# Patient Record
Sex: Male | Born: 1935 | Race: White | Hispanic: No | Marital: Married | Smoking: Former smoker
Health system: Southern US, Community
[De-identification: ages and names within clinical notes are randomized; demographics above are authoritative.]

## PROBLEM LIST (undated history)

## (undated) DIAGNOSIS — Z9989 Dependence on other enabling machines and devices: Secondary | ICD-10-CM

## (undated) DIAGNOSIS — R079 Chest pain, unspecified: Secondary | ICD-10-CM

## (undated) DIAGNOSIS — R55 Syncope and collapse: Secondary | ICD-10-CM

## (undated) DIAGNOSIS — Z9289 Personal history of other medical treatment: Secondary | ICD-10-CM

## (undated) DIAGNOSIS — G4733 Obstructive sleep apnea (adult) (pediatric): Secondary | ICD-10-CM

## (undated) DIAGNOSIS — R0609 Other forms of dyspnea: Secondary | ICD-10-CM

## (undated) DIAGNOSIS — Z950 Presence of cardiac pacemaker: Secondary | ICD-10-CM

## (undated) DIAGNOSIS — K219 Gastro-esophageal reflux disease without esophagitis: Secondary | ICD-10-CM

## (undated) DIAGNOSIS — I2510 Atherosclerotic heart disease of native coronary artery without angina pectoris: Secondary | ICD-10-CM

## (undated) DIAGNOSIS — R42 Dizziness and giddiness: Secondary | ICD-10-CM

## (undated) DIAGNOSIS — I495 Sick sinus syndrome: Secondary | ICD-10-CM

## (undated) DIAGNOSIS — R0600 Dyspnea, unspecified: Secondary | ICD-10-CM

## (undated) HISTORY — DX: Chest pain, unspecified: R07.9

## (undated) HISTORY — DX: Personal history of other medical treatment: Z92.89

## (undated) HISTORY — DX: Atherosclerotic heart disease of native coronary artery without angina pectoris: I25.10

## (undated) HISTORY — DX: Dyspnea, unspecified: R06.00

## (undated) HISTORY — DX: Dizziness and giddiness: R42

## (undated) HISTORY — DX: Other forms of dyspnea: R06.09

---

## 2010-05-20 ENCOUNTER — Encounter: Admission: RE | Admit: 2010-05-20 | Discharge: 2010-05-20 | Payer: Self-pay | Admitting: Gastroenterology

## 2013-02-01 ENCOUNTER — Other Ambulatory Visit: Payer: Self-pay | Admitting: Neurology

## 2013-02-01 ENCOUNTER — Encounter: Payer: Self-pay | Admitting: Neurology

## 2013-02-01 ENCOUNTER — Ambulatory Visit (INDEPENDENT_AMBULATORY_CARE_PROVIDER_SITE_OTHER): Payer: Medicare Other

## 2013-02-01 ENCOUNTER — Ambulatory Visit (INDEPENDENT_AMBULATORY_CARE_PROVIDER_SITE_OTHER): Payer: Medicare Other | Admitting: Neurology

## 2013-02-01 VITALS — BP 130/71 | HR 73 | Ht 72.0 in | Wt 200.0 lb

## 2013-02-01 DIAGNOSIS — R0602 Shortness of breath: Secondary | ICD-10-CM

## 2013-02-01 DIAGNOSIS — R42 Dizziness and giddiness: Secondary | ICD-10-CM

## 2013-02-01 DIAGNOSIS — R55 Syncope and collapse: Secondary | ICD-10-CM

## 2013-02-01 DIAGNOSIS — R5381 Other malaise: Secondary | ICD-10-CM

## 2013-02-01 DIAGNOSIS — H538 Other visual disturbances: Secondary | ICD-10-CM

## 2013-02-01 DIAGNOSIS — G459 Transient cerebral ischemic attack, unspecified: Secondary | ICD-10-CM

## 2013-02-01 DIAGNOSIS — G4733 Obstructive sleep apnea (adult) (pediatric): Secondary | ICD-10-CM

## 2013-02-01 DIAGNOSIS — R531 Weakness: Secondary | ICD-10-CM

## 2013-02-01 NOTE — Procedures (Signed)
  History:  Colton Bennett is a 78 year old gentleman with a history of dizziness dating back 2 years. The patient has had a recent event of syncope and collapse that occurred several weeks prior to this evaluation. The patient being evaluated for this event.  This is a routine EEG. No skull defects are noted. Medications include multivitamins, Prilosec, and Uroxatral.  EEG classification: Normal awake and drowsy  Description of the recording: The background rhythms of this recording consists of a fairly well modulated medium amplitude alpha rhythm of 9 Hz that is reactive to eye opening and closure. As the record progresses, the patient appears to remain in the waking state throughout the recording. Photic stimulation was performed, resulting in a bilateral and symmetric photic driving response. Hyperventilation was also performed, resulting in a minimal buildup of the background rhythm activities without significant slowing seen. Toward the end of the recording, the patient enters the drowsy state with slight symmetric slowing seen. The patient never enters stage II sleep. At no time during the recording does there appear to be evidence of spike or spike wave discharges or evidence of focal slowing. EKG monitor shows no evidence of cardiac rhythm abnormalities with a heart rate of 60. Occasional PVCs were seen.  Impression: This is a normal EEG recording in the waking and drowsy state. No evidence of ictal or interictal discharges are seen.

## 2013-02-01 NOTE — Patient Instructions (Addendum)
I think overall you are doing fairly well but I do want to suggest a few things today:  Remember to drink plenty of fluid, eat healthy meals and do not skip any meals. Try to eat protein with a every meal and eat a healthy snack such as fruit or nuts in between meals. Try to keep a regular sleep-wake schedule and try to exercise daily, particularly in the form of walking, 20-30 minutes a day, if you can.   Make sure, you always change positions very slowly. You should walk with a cane for safety.   As far as your medications are concerned, I would like to suggest no changes, we may have you start a baby aspirin after these tests.   As far as diagnostic testing: blood work, multiple tests.   I would like to see you back in 3 months, sooner if we need to. Please call us with any interim questions, concerns, problems, updates or refill requests.  Please also call us for any test results so we can go over those with you on the phone. Brett Canales is my clinical assistant and will answer any of your questions and relay your messages to me and also relay most of my messages to you.  Our phone number is [phone]. We also have an after hours call service for urgent matters and there is a physician on-call for urgent questions. For any emergencies you know to call 911 or go to the nearest emergency room.
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
## 2013-02-02 ENCOUNTER — Ambulatory Visit (HOSPITAL_COMMUNITY): Payer: Medicare Other | Attending: Neurology | Admitting: Radiology

## 2013-02-02 ENCOUNTER — Other Ambulatory Visit (HOSPITAL_COMMUNITY): Payer: Self-pay | Admitting: Neurology

## 2013-02-02 ENCOUNTER — Encounter (INDEPENDENT_AMBULATORY_CARE_PROVIDER_SITE_OTHER): Payer: Medicare Other

## 2013-02-02 ENCOUNTER — Telehealth: Payer: Self-pay | Admitting: *Deleted

## 2013-02-02 ENCOUNTER — Other Ambulatory Visit: Payer: Self-pay | Admitting: *Deleted

## 2013-02-02 DIAGNOSIS — R55 Syncope and collapse: Secondary | ICD-10-CM

## 2013-02-02 DIAGNOSIS — R42 Dizziness and giddiness: Secondary | ICD-10-CM

## 2013-02-02 DIAGNOSIS — R0989 Other specified symptoms and signs involving the circulatory and respiratory systems: Secondary | ICD-10-CM | POA: Insufficient documentation

## 2013-02-02 DIAGNOSIS — H538 Other visual disturbances: Secondary | ICD-10-CM

## 2013-02-02 DIAGNOSIS — R531 Weakness: Secondary | ICD-10-CM

## 2013-02-02 DIAGNOSIS — R0602 Shortness of breath: Secondary | ICD-10-CM

## 2013-02-02 DIAGNOSIS — G4733 Obstructive sleep apnea (adult) (pediatric): Secondary | ICD-10-CM

## 2013-02-02 DIAGNOSIS — G459 Transient cerebral ischemic attack, unspecified: Secondary | ICD-10-CM

## 2013-02-02 DIAGNOSIS — Z8673 Personal history of transient ischemic attack (TIA), and cerebral infarction without residual deficits: Secondary | ICD-10-CM | POA: Insufficient documentation

## 2013-02-02 DIAGNOSIS — R0609 Other forms of dyspnea: Secondary | ICD-10-CM | POA: Insufficient documentation

## 2013-02-02 DIAGNOSIS — I080 Rheumatic disorders of both mitral and aortic valves: Secondary | ICD-10-CM | POA: Insufficient documentation

## 2013-02-02 DIAGNOSIS — Z87891 Personal history of nicotine dependence: Secondary | ICD-10-CM | POA: Insufficient documentation

## 2013-02-02 DIAGNOSIS — R5381 Other malaise: Secondary | ICD-10-CM | POA: Insufficient documentation

## 2013-02-02 LAB — CBC WITH DIFFERENTIAL
Eosinophils Absolute: 0.1 10*3/uL (ref 0.0–0.4)
Immature Grans (Abs): 0 10*3/uL (ref 0.0–0.1)
Immature Granulocytes: 0 % (ref 0–2)
Lymphocytes Absolute: 1.7 10*3/uL (ref 0.7–3.1)
MCH: 30.5 pg (ref 26.6–33.0)
MCHC: 33.8 g/dL (ref 31.5–35.7)
MCV: 90 fL (ref 79–97)
Monocytes Absolute: 0.6 10*3/uL (ref 0.1–0.9)
Neutrophils Relative %: 63 % (ref 40–74)
Platelets: 200 10*3/uL (ref 150–379)
RDW: 13.7 % (ref 12.3–15.4)

## 2013-02-02 LAB — COMPREHENSIVE METABOLIC PANEL
ALT: 13 IU/L (ref 0–44)
AST: 18 IU/L (ref 0–40)
Albumin/Globulin Ratio: 1.9 (ref 1.1–2.5)
Alkaline Phosphatase: 81 IU/L (ref 39–117)
BUN/Creatinine Ratio: 15 (ref 10–22)
CO2: 24 mmol/L (ref 18–29)
Calcium: 9.8 mg/dL (ref 8.6–10.2)
Creatinine, Ser: 1.06 mg/dL (ref 0.76–1.27)
GFR calc non Af Amer: 67 mL/min/{1.73_m2} (ref >59)
Globulin, Total: 2.2 g/dL (ref 1.5–4.5)
Potassium: 4.3 mmol/L (ref 3.5–5.2)
Sodium: 144 mmol/L (ref 134–144)

## 2013-02-02 LAB — SEDIMENTATION RATE: Sed Rate: 2 mm/hr (ref 0–30)

## 2013-02-02 LAB — RHEUMATOID FACTOR: Rhuematoid fact SerPl-aCnc: <7 IU/mL (ref 0.0–13.9)

## 2013-02-02 LAB — TSH: TSH: 3.44 u[IU]/mL (ref 0.450–4.500)

## 2013-02-02 NOTE — Telephone Encounter (Signed)
OBrien called from [facility] about pt and the test ordered for tomorrow  2Decho with bubble.  Confirming test to be done?   I consulted Dr. Frances Furbish and she wants to have just 2D Echo , no bubble.  LMVM this am for Obrien [phone].  Tried to cancel but could not due to pt scheduled for test.  Called and spoke to [place] at [place] to try to reach [place].

## 2013-02-02 NOTE — Progress Notes (Signed)
Echocardiogram performed.  

## 2013-02-02 NOTE — Telephone Encounter (Signed)
30 day event monitor placed on Pt 02/02/13 TK

## 2013-02-02 NOTE — Progress Notes (Signed)
Quick Note:  Please call patient and advise him that his brain wave test, EEG, was reported as normal in the awake and drowsy state. Also his echocardiogram result is back which is the ultrasound of the heart showing no significant abnormalities in the heart but the left atrium is mild to moderately dilated. I do not see that this is a significant problem but if he has never seen a cardiologist I would like for him to get checked out fully. Again there is no problem with his heart valves or heart wall motion or the pump function, but for completion of would like for him to see a cardiologist unless he was seen before. Please advise  Huston Foley, MD, PhD [facility] ([facility]  ______

## 2013-02-03 ENCOUNTER — Telehealth: Payer: Self-pay | Admitting: Neurology

## 2013-02-03 NOTE — Telephone Encounter (Signed)
Pt called would like Dr. Frances Furbish to return his call concerning his results. Thanks

## 2013-02-03 NOTE — Progress Notes (Signed)
Quick Note:  Please call and advise the patient that the recent labs we checked were within normal limits. We checked cell count, blood sugar, diabetes marker, electrolytes, kidney function, liver function, thyroid function, inflammatory markers, sedimentation rate, muscle enzymes. No further action is required on these tests at this time. Please remind patient to keep any upcoming appointments or tests and to call us with any interim questions, concerns, problems or updates. Thanks,  Huston Foley, MD, PhD    ______

## 2013-02-06 NOTE — Telephone Encounter (Signed)
Pt called would like Dr. Athar to return his call concerning his results. Thanks  °

## 2013-02-06 NOTE — Progress Notes (Signed)
Quick Note:  Spoke with patient's wife and relayed results of blood work. Mrs Rabideau understood and had no questions.  ______

## 2013-02-06 NOTE — Progress Notes (Addendum)
Quick Note:  Spoke with patient's wife and relayed the results of his EEG as well as Dr Teofilo Pod advise or recommendations. Mrs Scheffel would like Dr Frances Furbish to refer Mr Opheim to a cardiologist.   ______ Referral to cardiology entered. Pls help initiate. thx s

## 2013-02-07 NOTE — Addendum Note (Signed)
Addended by: Huston Foley on: 02/07/2013 03:39 PM   Modules accepted: Orders

## 2013-02-08 ENCOUNTER — Ambulatory Visit (INDEPENDENT_AMBULATORY_CARE_PROVIDER_SITE_OTHER): Payer: Medicare Other | Admitting: Neurology

## 2013-02-08 DIAGNOSIS — G4733 Obstructive sleep apnea (adult) (pediatric): Secondary | ICD-10-CM

## 2013-02-08 DIAGNOSIS — R9431 Abnormal electrocardiogram [ECG] [EKG]: Secondary | ICD-10-CM

## 2013-02-08 DIAGNOSIS — G479 Sleep disorder, unspecified: Secondary | ICD-10-CM

## 2013-02-08 NOTE — Telephone Encounter (Signed)
I called and talked to his wife yesterday in the afternoon on the phone. We are going to proceed with cardiology consultation. He is also undergoing a sleep study and is scheduled for his brain scans. His recent echocardiogram results and EEG are discussed again with her. She had no further questions and was in agreement.

## 2013-02-09 ENCOUNTER — Ambulatory Visit
Admission: RE | Admit: 2013-02-09 | Discharge: 2013-02-09 | Disposition: A | Discharge status: Home / Self Care | Payer: Medicare Other | Source: Ambulatory Visit | Attending: Neurology | Admitting: Neurology

## 2013-02-09 DIAGNOSIS — R55 Syncope and collapse: Secondary | ICD-10-CM

## 2013-02-09 DIAGNOSIS — G459 Transient cerebral ischemic attack, unspecified: Secondary | ICD-10-CM

## 2013-02-09 DIAGNOSIS — H538 Other visual disturbances: Secondary | ICD-10-CM

## 2013-02-09 DIAGNOSIS — R531 Weakness: Secondary | ICD-10-CM

## 2013-02-09 DIAGNOSIS — R42 Dizziness and giddiness: Secondary | ICD-10-CM

## 2013-02-09 DIAGNOSIS — R0602 Shortness of breath: Secondary | ICD-10-CM

## 2013-02-09 MED ORDER — GADOBENATE DIMEGLUMINE 529 MG/ML IV SOLN: 20.0000 mL | Freq: Once | INTRAVENOUS | Status: AC | PRN

## 2013-02-21 ENCOUNTER — Telehealth: Payer: Self-pay | Admitting: Neurology

## 2013-02-23 NOTE — Telephone Encounter (Signed)
Returned patients call. No answer.

## 2013-02-24 ENCOUNTER — Telehealth: Payer: Self-pay | Admitting: Neurology

## 2013-02-24 DIAGNOSIS — G4733 Obstructive sleep apnea (adult) (pediatric): Secondary | ICD-10-CM

## 2013-02-24 NOTE — Telephone Encounter (Signed)
Please call and notify the patient that the recent sleep study did confirm the diagnosis of obstructive sleep apnea and that I recommend treatment for this in the form of CPAP. This will require a repeat sleep study for proper titration and mask fitting. Please explain to patient and arrange for a CPAP titration study. I have placed an order in the chart. The reason why he did not end up using CPAP during the study was mainly because his sleep is quite interrupted. And it was primarily in dream sleep that he exhibits more severe sleep apnea and this happened later at night. I noticed some EKG changes which were mostly in the form of extra heartbeats. I know that he still has his cardiac monitor. I was wondering when he is scheduled to see cardiology or whether he has not heard back yet. We may have to follow through with his cardiology referral. Thanks, Huston Foley, MD, PhD [facility]

## 2013-03-06 NOTE — Telephone Encounter (Signed)
Called patient and relayed  What Dr. Frances Furbish recommended, I also made [facility] aware of the patient needing a sleep study.Patient has an appointment with his cardiologist  04-02-2013

## 2013-03-07 ENCOUNTER — Encounter: Payer: Self-pay | Admitting: *Deleted

## 2013-03-07 NOTE — Telephone Encounter (Signed)
Spoke to patient and spouse together on the line, apologized for the delay in results and explained that I had been out of the office.  Called patient to discuss sleep study results.  Discussed findings, recommendations and follow up care.  Patient understood well and all questions were answered.  Scheduled CPAP Titration for 03/15/2013 at 9:30 PM, patient would like [place] to have room for his spouse to stay if possible, they are elderly and he doesn't feel comfortable having her home alone.  We will accomodate that on our schedule that night.  Copy of results will be forwarded to Dr. Andi Devon of [facility]. A copy of the sleep study interpretive report  and the date of the CPAP TITRATION appointment info will be mailed to the patient's home.

## 2013-03-13 HISTORY — PX: LOOP RECORDER IMPLANT: SHX5954

## 2013-03-14 NOTE — Progress Notes (Signed)
Quick Note:  Please advise patient that his MRA brain and neck showed no significant blood vessel related issues in his brain or neck. His brain MRI showed a normal structure of the brain and no significant volume loss which we call atrophy. There were changes in the deeper structures of the brain, which we call white matter changes or microvascular changes. These are tiny white spots, that occur with time and are seen in a variety of conditions, including with normal aging, chronic hypertension, chronic headaches, especially migraine HAs, chronic diabetes, chronic hyperlipidemia and other considerations include: considerations include autoimmune, inflammatory, post-infectious etiologies. Again, there were no acute findings, such as a stroke, or mass or blood products. We like to re-enforce the importance of good blood pressure control, good cholesterol control, good blood sugar control, and weight management.  Since we did the brain scan without contrast we could if he agrees follow this up with a brain MRI with contrast. That will give [place] reassurance that the abnormalities seen on the brain MRI without contrast are not something that is inflammatory in nature. Please ask him if he is willing to go ahead with a contrasted brain MRI for which I would need to place a separate order. Please let me know.   Please remind patient to keep any upcoming appointments or tests and to call us with any interim questions, concerns, problems or updates. Thanks,  Huston Foley, MD, PhD    ______

## 2013-03-17 ENCOUNTER — Telehealth: Payer: Self-pay | Admitting: *Deleted

## 2013-03-17 DIAGNOSIS — R9089 Other abnormal findings on diagnostic imaging of central nervous system: Secondary | ICD-10-CM

## 2013-03-17 NOTE — Telephone Encounter (Signed)
Please call Patients spouse with MRI and MRA Results from July.

## 2013-03-17 NOTE — Telephone Encounter (Signed)
Spoke to spouse and patient. Gave Dr. Teofilo Pod MRI/MRA results per her note on 03/14/13. Patient would like to proceed with MRI w/ contrast.

## 2013-03-21 ENCOUNTER — Ambulatory Visit (INDEPENDENT_AMBULATORY_CARE_PROVIDER_SITE_OTHER): Payer: Medicare Other | Admitting: Neurology

## 2013-03-21 VITALS — BP 119/67 | HR 54 | Ht 72.0 in | Wt 196.0 lb

## 2013-03-21 DIAGNOSIS — R9431 Abnormal electrocardiogram [ECG] [EKG]: Secondary | ICD-10-CM

## 2013-03-21 DIAGNOSIS — G4733 Obstructive sleep apnea (adult) (pediatric): Secondary | ICD-10-CM

## 2013-03-21 DIAGNOSIS — G479 Sleep disorder, unspecified: Secondary | ICD-10-CM

## 2013-03-27 NOTE — Telephone Encounter (Signed)
MRI brain w contrast ordered.  Huston Foley, MD, PhD [facility])

## 2013-03-30 ENCOUNTER — Ambulatory Visit
Admission: RE | Admit: 2013-03-30 | Discharge: 2013-03-30 | Disposition: A | Discharge status: Home / Self Care | Payer: Medicare Other | Source: Ambulatory Visit | Attending: Neurology | Admitting: Neurology

## 2013-03-30 ENCOUNTER — Telehealth: Payer: Self-pay | Admitting: Neurology

## 2013-03-30 DIAGNOSIS — R9409 Abnormal results of other function studies of central nervous system: Secondary | ICD-10-CM

## 2013-03-30 DIAGNOSIS — R9089 Other abnormal findings on diagnostic imaging of central nervous system: Secondary | ICD-10-CM

## 2013-03-30 DIAGNOSIS — G4733 Obstructive sleep apnea (adult) (pediatric): Secondary | ICD-10-CM

## 2013-03-30 MED ORDER — GADOBENATE DIMEGLUMINE 529 MG/ML IV SOLN
18.0000 mL | Freq: Once | INTRAVENOUS | Status: AC | PRN
  Administered 2013-03-30: 18 mL via INTRAVENOUS

## 2013-03-30 NOTE — Telephone Encounter (Signed)
Please call and notify patient that the recent sleep study good results with CPAP. Therefore, I would like start the patient on CPAP at home. I placed the order in the chart.   Arrange for CPAP set up at home through a DME company of patient's choice and fax/route report to PCP and referring MD (if other than PCP).   The patient will also need a follow up appointment with me in 6-8 weeks post set up that has to be scheduled; help the patient schedule this (in a follow-up slot).   Please re-enforce the importance of compliance with treatment and the need for [place] to monitor compliance data.   Once you have spoken to the patient and scheduled the return appointment, you may close this encounter, thanks,   Huston Foley, MD, PhD [facility]

## 2013-04-03 ENCOUNTER — Ambulatory Visit (INDEPENDENT_AMBULATORY_CARE_PROVIDER_SITE_OTHER): Payer: Medicare Other | Admitting: Cardiovascular Disease

## 2013-04-03 ENCOUNTER — Telehealth: Payer: Self-pay | Admitting: Neurology

## 2013-04-03 ENCOUNTER — Encounter: Payer: Self-pay | Admitting: *Deleted

## 2013-04-03 ENCOUNTER — Encounter: Payer: Self-pay | Admitting: Cardiovascular Disease

## 2013-04-03 VITALS — BP 128/78 | HR 75 | Ht 72.0 in | Wt 201.0 lb

## 2013-04-03 DIAGNOSIS — R55 Syncope and collapse: Secondary | ICD-10-CM

## 2013-04-03 DIAGNOSIS — R0609 Other forms of dyspnea: Secondary | ICD-10-CM | POA: Insufficient documentation
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
## 2013-04-03 NOTE — Telephone Encounter (Signed)
Call pt, no answer, left message.

## 2013-04-03 NOTE — Progress Notes (Signed)
Quick Note:  Please advise the patient that his recent repeated MRI brain with contrast did not show any abnormal uptake of contrast and no concern for any lesions or mass or tumor or findings of an inflammation or infection. The findings are reassuring. Huston Foley, MD, PhD [facility]  ______
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
## 2013-04-03 NOTE — Patient Instructions (Addendum)
Dr Elease Hashimoto requests that you be scheduled for a loop recorder, this device will be inserted under the skin and can stay in place for a year . See instruction  Your physician wants you to follow-up in: 6 months receive a reminder letter in the mail two months in advance. If you don't receive a letter, please call our office to schedule the follow-up appointment.

## 2013-04-04 ENCOUNTER — Encounter (HOSPITAL_COMMUNITY): Payer: Self-pay | Admitting: Pharmacy Technician

## 2013-04-05 ENCOUNTER — Encounter: Payer: Self-pay | Admitting: *Deleted

## 2013-04-05 ENCOUNTER — Encounter (HOSPITAL_COMMUNITY): Payer: Self-pay | Admitting: Internal Medicine

## 2013-04-05 ENCOUNTER — Ambulatory Visit (HOSPITAL_COMMUNITY)
Admission: RE | Admit: 2013-04-05 | Discharge: 2013-04-05 | Disposition: A | Discharge status: Home / Self Care | Payer: Medicare Other | Source: Ambulatory Visit | Attending: Internal Medicine | Admitting: Internal Medicine

## 2013-04-05 ENCOUNTER — Encounter (HOSPITAL_COMMUNITY)
Admission: RE | Disposition: A | Discharge status: Home / Self Care | Payer: Self-pay | Source: Ambulatory Visit | Attending: Internal Medicine

## 2013-04-05 DIAGNOSIS — R55 Syncope and collapse: Secondary | ICD-10-CM

## 2013-04-05 DIAGNOSIS — R42 Dizziness and giddiness: Secondary | ICD-10-CM

## 2013-04-05 HISTORY — PX: LOOP RECORDER IMPLANT: SHX5477

## 2013-04-05 HISTORY — DX: Syncope and collapse: R55

## 2013-04-05 SURGERY — LOOP RECORDER IMPLANT
Anesthesia: LOCAL

## 2013-04-05 MED ORDER — LIDOCAINE-EPINEPHRINE 1 %-1:100000 IJ SOLN
INTRAMUSCULAR | Status: AC
  Filled 2013-04-05: qty 1

## 2013-04-05 NOTE — Interval H&P Note (Signed)
History and Physical Interval Note:  04/05/2013 8:06 AM  Colton Bennett  has presented today for surgery, with the diagnosis of syncope  The various methods of treatment have been discussed with the patient and family. After consideration of risks, benefits and other options for treatment, the patient has consented to  Procedure(s): LOOP RECORDER IMPLANT (N/A) as a surgical intervention .  The patient's history has been reviewed, patient examined, no change in status, stable for surgery.  I have reviewed the patient's chart and labs.  Questions were answered to the patient's satisfaction.     Hillis Range

## 2013-04-05 NOTE — Op Note (Signed)
SURGEON:  Hillis Range, MD     PREPROCEDURE DIAGNOSIS:  unexplained near syncope, dizziness    POSTPROCEDURE DIAGNOSIS:   unexplained near syncope, dizziness     PROCEDURES:   1. Implantable loop recorder implantation    INTRODUCTION:  Colton Bennett is a 79 y.o. male with a history of unexplained near syncope and recurrent dizziness who presents today for implantable loop implantation.   Despite an extensive workup by cardiology and neurology, no reversible causes have been identified.  he has worn 30 day event monitor during which he did not have symptoms.  The patient therefore presents today for implantable loop implantation.     DESCRIPTION OF PROCEDURE:  Informed written consent was obtained, and the patient was brought to the electrophysiology lab in a fasting state.  The patient required no sedation for the procedure today.  Mapping over the patient's chest was performed by the EP lab staff to identify the area where electrograms were most prominent for ILR recording.  This area was found to be the left parasternal region over the 3rd-4th intercostal space. The patients left chest was therefore prepped and draped in the usual sterile fashion by the EP lab staff. The skin overlying the left parasternal region was infiltrated with lidocaine for local analgesia.  A 0.5-cm incision was made over the left parasternal region over the 3rd intercostal space.  A subcutaneous ILR pocket was fashioned using a combination of sharp and blunt dissection.  A Medtronic Reveal [place] model [id number] implantable loop recorder was then placed into the pocket  R waves were very prominent and measured 0.27mV.  Steri- Strips and a sterile dressing were then applied.  There were no early apparent complications.     CONCLUSIONS:   1. Successful implantation of a Medtronic Reveal LINQ implantable loop recorder for recurrent dizziness and near syncope  2. No early apparent complications.

## 2013-04-05 NOTE — Telephone Encounter (Signed)
Patient also asked about his MRI results saying he hadn't heard anything.  I was able to see Dr. Teofilo Pod quick note and read those results to him stating normal findings.

## 2013-04-05 NOTE — Telephone Encounter (Signed)
Called patient to discuss sleep study results.  Discussed findings, recommendations and follow up care.  Patient understood well and all questions were answered.   Orders will be forwarded to [facility] as this patient has Medicare.  Asked the patient to call me in one week if he hasn't heard from DME company for setup.  Patient already has a follow up appt scheduled in November with Dr. Frances Furbish. A copy of the sleep study interpretive report as well as a letter with info regarding contact info for the DME company, the importance of CPAP compliance, and the date of the follow up appointment info will be mailed to the patient's home. Copy of report will be faxed to Dr. Andi Devon

## 2013-04-05 NOTE — Consult Note (Signed)
 Primary Care Physician: ATHAR, SAIMA, MD Referring Physician:  Dr Nahser   Colton Bennett is a 79 y.o. male with a h/o presents for EP consultation following an episode of presyncope.  He reports that on 01/14/13 he had sudden collapse.  He reporrts that he was talking beside his truck in a parking lot.  He developed abrupt nausea with vomiting and was then unsteady.  He collapsed but did not lose consciousness. He wore a 30 day event monitor. He was found to have normal sinus rhythm but did not have further symptoms while wearing this monitor.  He had an echocardiogram which revealed normal left ventricle systolic function with an ejection fraction of 50-55%. He had normal wall motion. He had trivial aortic insufficiency, mild mitral regurgitation. The left atrium was mildly to moderately dilated.  He has had 2 different sleep studies. The first study was July 31 which revealed severe sleep apnea during REM sleep. He had a second sleep study the results of which are not known at this time.  He has occasional periods of brief dizziness lasting several seconds.  Today, he denies symptoms of palpitations, chest pain, shortness of breath, orthopnea, PND, lower extremity edema, dizziness, further presyncope, syncope, or neurologic sequela. The patient is tolerating medications without difficulties and is otherwise without complaint today.   Past Medical History  Diagnosis Date  . Syncope, near    No prior surgeries  Current Facility-Administered Medications  Medication Dose Route Frequency Provider Last Rate Last Dose  . [COMPLETED] lidocaine-EPINEPHrine (XYLOCAINE W/EPI) 1 %-1:100000 (with pres) injection             No Known Allergies  History   Social History  . Marital Status: Married    Spouse Name: N/A    Number of Children: N/A  . Years of Education: N/A   Occupational History  . Not on file.   Social History Main Topics  . Smoking status: Former Smoker    Quit date: 02/01/1982    . Smokeless tobacco: Not on file  . Alcohol Use: No  . Drug Use: No  . Sexual Activity: Not on file   Other Topics Concern  . Not on file   Social History Narrative  . No narrative on file    Denies FH of syncope or sudden death  ROS- All systems are reviewed and negative except as per the HPI above  Physical Exam: Filed Vitals:   04/05/13 0713  BP: 144/69  Pulse: 73  Temp: 97.8 F (36.6 C)  TempSrc: Oral  Resp: 18  Height: 6' (1.829 m)  Weight: 195 lb (88.451 kg)  SpO2: 99%    GEN- The patient is well appearing, alert and oriented x 3 today.   Head- normocephalic, atraumatic Eyes-  Sclera clear, conjunctiva pink Ears- hearing intact Oropharynx- clear Neck- supple, no JVP Lymph- no cervical lymphadenopathy Lungs- Clear to ausculation bilaterally, normal work of breathing Heart- Regular rate and rhythm, no murmurs, rubs or gallops, PMI not laterally displaced GI- soft, NT, ND, + BS Extremities- no clubbing, cyanosis, or edema MS- no significant deformity or atrophy Skin- no rash or lesion Psych- euthymic mood, full affect Neuro- strength and sensation are intact  EKG 04/03/13- sinus rhythm with PVCs not closely coupled to the preceding t wave Echo and event monitor are reviewed  Assessment and Plan:  1. Presyncope of unclear etiology with frequent episodes of dizziness The patient presents today for EP consult.  He has a structurally normal heart, low   risk ekg, and no fh of sudden death.  He does find his presyncope and recurrent dizziness to be alarming.  Unfortunately, he did not have these symptoms recently while wearing his event monitor. I think that the most prudent action would be to place an implantable recordable loop monitor. Risks, benefits, and alteratives to this procedure were discussed at length with the patient who wishes to proceed at this time. 

## 2013-04-05 NOTE — H&P (View-Only) (Signed)
Primary Care Physician: Huston Foley, MD Referring Physician:  Dr Vianne Bulls is a 79 y.o. male with a h/o presents for EP consultation following an episode of presyncope.  He reports that on 01/14/13 he had sudden collapse.  He reporrts that he was talking beside his truck in a parking lot.  He developed abrupt nausea with vomiting and was then unsteady.  He collapsed but did not lose consciousness. He wore a 30 day event monitor. He was found to have normal sinus rhythm but did not have further symptoms while wearing this monitor.  He had an echocardiogram which revealed normal left ventricle systolic function with an ejection fraction of 50-55%. He had normal wall motion. He had trivial aortic insufficiency, mild mitral regurgitation. The left atrium was mildly to moderately dilated.  He has had 2 different sleep studies. The first study was July 31 which revealed severe sleep apnea during REM sleep. He had a second sleep study the results of which are not known at this time.  He has occasional periods of brief dizziness lasting several seconds.  Today, he denies symptoms of palpitations, chest pain, shortness of breath, orthopnea, PND, lower extremity edema, dizziness, further presyncope, syncope, or neurologic sequela. The patient is tolerating medications without difficulties and is otherwise without complaint today.   Past Medical History  Diagnosis Date  . Syncope, near    No prior surgeries  Current Facility-Administered Medications  Medication Dose Route Frequency Provider Last Rate Last Dose  . [COMPLETED] lidocaine-EPINEPHrine (XYLOCAINE W/EPI) 1 %-1:100000 (with pres) injection             No Known Allergies  History   Social History  . Marital Status: Married    Spouse Name: N/A    Number of Children: N/A  . Years of Education: N/A   Occupational History  . Not on file.   Social History Main Topics  . Smoking status: Former Smoker    Quit date: 02/01/1982    . Smokeless tobacco: Not on file  . Alcohol Use: No  . Drug Use: No  . Sexual Activity: Not on file   Other Topics Concern  . Not on file   Social History Narrative  . No narrative on file    Denies FH of syncope or sudden death  ROS- All systems are reviewed and negative except as per the HPI above  Physical Exam: Filed Vitals:   04/05/13 0713  BP: 144/69  Pulse: 73  Temp: 97.8 F (36.6 C)  TempSrc: Oral  Resp: 18  Height: 6' (1.829 m)  Weight: 195 lb (88.451 kg)  SpO2: 99%    GEN- The patient is well appearing, alert and oriented x 3 today.   Head- normocephalic, atraumatic Eyes-  Sclera clear, conjunctiva pink Ears- hearing intact Oropharynx- clear Neck- supple, no JVP Lymph- no cervical lymphadenopathy Lungs- Clear to ausculation bilaterally, normal work of breathing Heart- Regular rate and rhythm, no murmurs, rubs or gallops, PMI not laterally displaced GI- soft, NT, ND, + BS Extremities- no clubbing, cyanosis, or edema MS- no significant deformity or atrophy Skin- no rash or lesion Psych- euthymic mood, full affect Neuro- strength and sensation are intact  EKG 04/03/13- sinus rhythm with PVCs not closely coupled to the preceding t wave Echo and event monitor are reviewed  Assessment and Plan:  1. Presyncope of unclear etiology with frequent episodes of dizziness The patient presents today for EP consult.  He has a structurally normal heart, low  risk ekg, and no fh of sudden death.  He does find his presyncope and recurrent dizziness to be alarming.  Unfortunately, he did not have these symptoms recently while wearing his event monitor. I think that the most prudent action would be to place an implantable recordable loop monitor. Risks, benefits, and alteratives to this procedure were discussed at length with the patient who wishes to proceed at this time.

## 2013-04-17 ENCOUNTER — Ambulatory Visit (INDEPENDENT_AMBULATORY_CARE_PROVIDER_SITE_OTHER): Payer: Medicare Other | Admitting: *Deleted

## 2013-04-17 DIAGNOSIS — R55 Syncope and collapse: Secondary | ICD-10-CM

## 2013-04-17 LAB — PACEMAKER DEVICE OBSERVATION

## 2013-04-17 NOTE — Progress Notes (Signed)
ILR wound check in clinic. Normal function. No episodes recorded. ROV in 3 mths w/JA.

## 2013-05-04 ENCOUNTER — Encounter: Payer: Self-pay | Admitting: Internal Medicine

## 2013-05-12 ENCOUNTER — Ambulatory Visit (INDEPENDENT_AMBULATORY_CARE_PROVIDER_SITE_OTHER): Payer: Medicare Other

## 2013-05-12 DIAGNOSIS — R55 Syncope and collapse: Secondary | ICD-10-CM

## 2013-05-16 LAB — PACEMAKER DEVICE OBSERVATION

## 2013-05-18 ENCOUNTER — Encounter: Payer: Self-pay | Admitting: Neurology

## 2013-05-18 ENCOUNTER — Other Ambulatory Visit: Payer: Self-pay

## 2013-05-18 ENCOUNTER — Ambulatory Visit (INDEPENDENT_AMBULATORY_CARE_PROVIDER_SITE_OTHER): Payer: Medicare Other | Admitting: Neurology

## 2013-05-18 VITALS — BP 123/74 | HR 71 | Ht 72.0 in | Wt 207.0 lb

## 2013-05-18 DIAGNOSIS — R42 Dizziness and giddiness: Secondary | ICD-10-CM

## 2013-05-18 DIAGNOSIS — R930 Abnormal findings on diagnostic imaging of skull and head, not elsewhere classified: Secondary | ICD-10-CM

## 2013-05-18 DIAGNOSIS — R55 Syncope and collapse: Secondary | ICD-10-CM

## 2013-05-18 DIAGNOSIS — R9089 Other abnormal findings on diagnostic imaging of central nervous system: Secondary | ICD-10-CM

## 2013-05-18 DIAGNOSIS — G4733 Obstructive sleep apnea (adult) (pediatric): Secondary | ICD-10-CM

## 2013-05-18 NOTE — Progress Notes (Signed)
Subjective:    Patient ID: Colton Bennett is a 79 y.o. male.  HPI  Interim history:  Colton Bennett is a very pleasant 79 year old right-handed woman with an underlying medical history of reflux disease and benign prostate hypertrophy who presents for followup consultation of her syncope and collapse. He is accompanied by his wife again today. I first met him on 02/01/2013, at which time I suggested further workup in the form of blood work including ANA, CRP, CBC, CK, hemoglobin A1c, CMP, serum electrophoresis, RF, RPR, ESR, TSH and I ordered a echocardiogram, EEG, cardiac event monitor, and MRA neck and head and MRI of brain as well as a sleep study. His labs were essentially unremarkable, with the exception of his hemoglobin A1c at 5.8. His TSH was normal. His MRI brain showed white matter changes, and repeat MRI brain with contrast showed no abnormal contrast enhancement. His MRA neck and head were unremarkable. His EEG was unremarkable. He had a baseline sleep study on 02/08/2013 which demonstrated a total AHI of 12 per hour, a severe REM related OSA with a REM-related AHI of 48 per hour and an oxyhemoglobin desaturation nadir of 83%. Based on those results I asked him to return for CPAP titration study which he had on 03/21/2013, his CPAP was titrated from 5-9 cm with almost complete resolution of his sleep disordered breathing on a pressure of 8 cm. He was provided with CPAP at a pressure of 8 cm with large nasal pillows. I also reviewed his compliance data from the machine that he brought in today. This is from 04/17/2013 through 05/17/2013 which is a total of 31 days during which time he used CPAP every day. His percent used days greater than 4 hours was 100% indicating excellent compliance. His average usage was 7 hours and 24 minutes. His leak was not very high. His residual AHI was 2.2 per hour indicating an appropriate treatment pressure of 8 cm of water with EPR of 2. He had a 30 day Holter monitor  which showed no sustained irregularities but some PVCs. Since then, in late September he had a implantable loop recorder placed.  Has been experiencing periods of dizziness and blurry vision as well as nausea and vomiting for the past several years, probably at least 2 years. Recent blood work included TSH, CMP and CBC which I reviewed. His blood work was unremarkable. His TSH was borderline at 4.5. Some 3 weeks ago, on 01/12/13 he had parked his car in a parking lot and was on his way to the store and collapsed. He had no warning signs, no palpitations, and claims he had no LOC, no HA, SOB, but does have SOBOE, no vertigo and since this incidence he feels weak all over. Per wife, some 2 years ago, he had an episode of L facial weakness, with residual weakness. No hx of staring spells, no twitching, no shaking, no automatism, except occasional lip smacking while driving or while eating.  He has had multiple short episodes of blurry vision. He has not seen a cardiologist, but had an eye exam recently. No hx of migraine, but has had mild recurrent HAs. He is doing well with CPAP regarding his sleep, sleeping better, resting better and less tired during the day. His SOB is better as well. He has not had any events since the loop recorder has been placed. It does not bother him. He still has recurrent short lived and milder headaches which last normally around 15 minutes but happen  almost every day. This is a most typically occipitally located headache. He does not have any presyncopal symptoms. He has intermittent blurry vision which is also very brief. All in all, he feels better.  His Past Medical History Is Significant For: Past Medical History  Diagnosis Date  . Syncope, near     His Past Surgical History Is Significant For: History reviewed. No pertinent past surgical history.  His Family History Is Significant For: History reviewed. No pertinent family history.  His Social History Is Significant  For: History   Social History  . Marital Status: Married    Spouse Name: N/A    Number of Children: N/A  . Years of Education: N/A   Social History Main Topics  . Smoking status: Former Smoker    Quit date: 02/01/1982  . Smokeless tobacco: None  . Alcohol Use: No  . Drug Use: No  . Sexual Activity: None   Other Topics Concern  . None   Social History Narrative  . None    His Allergies Are:  No Known Allergies:   His Current Medications Are:  Outpatient Encounter Prescriptions as of 05/18/2013  Medication Sig  . alfuzosin (UROXATRAL) 10 MG 24 hr tablet Take 10 mg by mouth daily.  . Multiple Vitamins-Minerals (MULTIVITAMIN PO) Take 1 tablet by mouth daily.  Marland Kitchen omeprazole (PRILOSEC) 40 MG capsule Take 40 mg by mouth daily.  :  Review of Systems:  Out of a complete 14 point review of systems, all are reviewed and negative with the exception of these symptoms as listed below:   Review of Systems  Constitutional: Negative.   HENT: Negative.   Eyes: Positive for visual disturbance (blurred vision occurs ocassionally - lasts 30 seconds.).  Respiratory: Negative.   Cardiovascular: Negative.   Gastrointestinal: Negative.   Endocrine: Negative.   Genitourinary: Negative.   Musculoskeletal: Negative.   Skin: Negative.   Allergic/Immunologic: Negative.   Neurological: Positive for dizziness (With blurred vision.   ).  Hematological: Negative.   Psychiatric/Behavioral: Negative.     Objective:  Neurologic Exam  Physical Exam Physical Examination:   Filed Vitals:   05/18/13 1531  BP: 123/74  Pulse: 71    General Examination: The patient is a very pleasant 79 y.o. male in no acute distress. He appears well-developed and well-nourished and well groomed and is in good spirits.   HEENT: Normocephalic, atraumatic, pupils are equal, round and reactive to light and accommodation. Extraocular tracking is good without limitation to gaze excursion or nystagmus noted. Normal  smooth pursuit is noted. Hearing is grossly intact. Face is slight asymmetric, unchanged. Speech is clear with no dysarthria noted. There is no hypophonia. There is no lip, neck/head, jaw or voice tremor. Neck is supple with full range of passive and active motion. There are no carotid bruits on auscultation. Oropharynx exam reveals: mild mouth dryness, adequate dental hygiene and moderate airway crowding, due to elongated uvula and redundant soft palate. Mallampati is class II. Tongue protrudes centrally and palate elevates symmetrically.   Chest: Clear to auscultation without wheezing, rhonchi or crackles noted.  Heart: S1+S2+0, regular and normal without murmurs, rubs or gallops noted.   Abdomen: Soft, non-tender and non-distended with normal bowel sounds appreciated on auscultation.  Extremities: There is no pitting edema in the distal lower extremities bilaterally. Pedal pulses are intact.  Skin: Warm and dry without trophic changes noted. There are no varicose veins. He has vitiligo.  Musculoskeletal: exam reveals no obvious joint deformities, tenderness or joint  swelling or erythema.   Neurologically:  Mental status: The patient is awake, alert and oriented in all 4 spheres. His memory, attention, language and knowledge are appropriate. There is no aphasia, agnosia, apraxia or anomia. Speech is clear with normal prosody and enunciation. Thought process is linear. Mood is congruent and affect is normal.  Cranial nerves are as described above under HEENT exam. In addition, shoulder shrug is normal with equal shoulder height noted. Motor exam: Normal bulk, strength and tone is noted. There is no drift, tremor or rebound. Romberg is negative, but there was mild sway. Reflexes are 2+ throughout. Fine motor skills are intact. Cerebellar testing shows no dysmetria or intention tremor on finger to nose testing. There is no truncal or gait ataxia. Sensory exam is intact to light touch.  Gait, station  and balance: he stands without difficulty. No veering to one side is noted. No leaning to one side is noted. No dizziness. Posture is age-appropriate and stance is narrow based. No problems turning are noted. He turns en bloc.   Assessment and Plan:   In summary, Kalep Full is a very pleasant 79 year old male with complaints of dizziness, SOBOE, syncope and collapse, intermittent blurry vision, and recurrent HAs. He has had an extensive workup including imaging studies, EEG, sleep study, Holter monitor and now has a implanted loop monitor. Thus far, we do not have a clear etiology of his syncopal event. His sleep study did confirm obstructive sleep apnea and he is now on CPAP with good tolerance and improvement of his daytime tiredness, shortness of breath, but not necessarily improvement of his recurrent headaches and blurry vision. I am not sure how to explain his recurrent milder and short-lived headaches. Also his intermittent blurry vision is unclear to me. He has been on Uroxatral for his prostate for 10 or maybe even 15 years and while it is possible to have side effects of dizziness and headaches as well as blurry vision on this medication and would be unusual to cause these symptoms now. He has no clear evidence of a prior stroke but does have white matter changes on his brain MRI. He has been advised to continue using CPAP regularly. He indicates good results with the use of CPAP, and good tolerance of the pressure and mask. I reviewed the compliance data with the patient and encouraged him to continue to use CPAP regularly to help reduce cardiovascular risk. I am very pleased with his compliance and congratulated him on being very adherent to therapy.  We also talked about trying to maintaining a healthy lifestyle in general. I encouraged the patient to eat healthy, exercise daily and keep well hydrated, to keep a scheduled bedtime and wake time routine, to not skip any meals and eat healthy snacks  in between meals and to have protein with every meal. I stressed the importance of regular exercise.   I answered all their questions today and the patient and his wife were in agreement with the above outlined plan. I would like to see the patient back in 2 months, sooner if the need arises and encouraged them to call with any interim questions, concerns, problems or updates.  Most of my 35 minute visit today was spent in counseling and coordination of care, reviewing test results and reviewing medication.

## 2013-05-18 NOTE — Patient Instructions (Signed)
Please continue using your CPAP regularly. While your insurance requires that you use CPAP at least 4 hours each night on 70% of the nights, I recommend, that you not skip any nights and use it throughout the night if you can. Getting used to CPAP does take time and patience and discipline. Untreated obstructive sleep apnea when it is moderate to severe can have an adverse impact on cardiovascular health and raise her risk for heart disease, arrhythmias, hypertension, congestive heart failure, stroke and diabetes. Untreated obstructive sleep apnea causes sleep disruption, nonrestorative sleep, and sleep deprivation. This can have an impact on your day to day functioning and cause daytime sleepiness and impairment of cognitive function, memory loss, mood disturbance, and problems focussing. Using CPAP regularly can improve these symptoms.   

## 2013-05-22 ENCOUNTER — Encounter: Payer: Self-pay | Admitting: Neurology

## 2013-05-26 ENCOUNTER — Encounter: Payer: Self-pay | Admitting: Internal Medicine

## 2013-06-01 ENCOUNTER — Observation Stay (HOSPITAL_COMMUNITY)
Admission: EM | Admit: 2013-06-01 | Discharge: 2013-06-02 | Disposition: A | Discharge status: Home / Self Care | Payer: Medicare Other | Attending: Cardiovascular Disease | Admitting: Cardiovascular Disease

## 2013-06-01 ENCOUNTER — Emergency Department (HOSPITAL_COMMUNITY): Payer: Medicare Other

## 2013-06-01 ENCOUNTER — Encounter (HOSPITAL_COMMUNITY): Payer: Self-pay | Admitting: Emergency Medicine

## 2013-06-01 DIAGNOSIS — R079 Chest pain, unspecified: Secondary | ICD-10-CM

## 2013-06-01 DIAGNOSIS — R55 Syncope and collapse: Secondary | ICD-10-CM | POA: Insufficient documentation

## 2013-06-01 DIAGNOSIS — R0789 Other chest pain: Secondary | ICD-10-CM | POA: Diagnosis present

## 2013-06-01 DIAGNOSIS — Z79899 Other long term (current) drug therapy: Secondary | ICD-10-CM | POA: Insufficient documentation

## 2013-06-01 DIAGNOSIS — Z87891 Personal history of nicotine dependence: Secondary | ICD-10-CM | POA: Insufficient documentation

## 2013-06-01 DIAGNOSIS — R072 Precordial pain: Principal | ICD-10-CM | POA: Insufficient documentation

## 2013-06-01 LAB — TROPONIN I
Troponin I: <0.3 ng/mL (ref <0.30)
Troponin I: <0.3 ng/mL (ref <0.30)

## 2013-06-01 LAB — BASIC METABOLIC PANEL
CO2: 28 mEq/L (ref 19–32)
Calcium: 9.4 mg/dL (ref 8.4–10.5)
Chloride: 103 mEq/L (ref 96–112)
GFR calc Af Amer: 68 mL/min — ABNORMAL LOW (ref >90)
Sodium: 139 mEq/L (ref 135–145)

## 2013-06-01 LAB — GLUCOSE, CAPILLARY: Glucose-Capillary: 71 mg/dL (ref 70–99)

## 2013-06-01 LAB — CBC
Platelets: 204 10*3/uL (ref 150–400)
RBC: 4.6 MIL/uL (ref 4.22–5.81)
RDW: 12.7 % (ref 11.5–15.5)
WBC: 6.8 10*3/uL (ref 4.0–10.5)

## 2013-06-01 LAB — POCT I-STAT TROPONIN I: Troponin i, poc: 0 ng/mL (ref 0.00–0.08)

## 2013-06-01 MED ORDER — ONDANSETRON HCL 4 MG/2ML IJ SOLN: 4.0000 mg | Freq: Four times a day (QID) | INTRAMUSCULAR | Status: DC | PRN

## 2013-06-01 MED ORDER — ACETAMINOPHEN 325 MG PO TABS: 650.0000 mg | ORAL_TABLET | ORAL | Status: DC | PRN

## 2013-06-01 MED ORDER — SODIUM CHLORIDE 0.9 % IJ SOLN: 3.0000 mL | INTRAMUSCULAR | Status: DC | PRN

## 2013-06-01 MED ORDER — SODIUM CHLORIDE 0.9 % IV SOLN: 250.0000 mL | INTRAVENOUS | Status: DC | PRN

## 2013-06-01 MED ORDER — PANTOPRAZOLE SODIUM 40 MG PO TBEC: 40.0000 mg | DELAYED_RELEASE_TABLET | Freq: Every day | ORAL | Status: DC

## 2013-06-01 MED ORDER — NITROGLYCERIN 0.4 MG SL SUBL: 0.4000 mg | SUBLINGUAL_TABLET | SUBLINGUAL | Status: DC | PRN

## 2013-06-01 MED ORDER — SODIUM CHLORIDE 0.9 % IJ SOLN
3.0000 mL | Freq: Two times a day (BID) | INTRAMUSCULAR | Status: DC
  Administered 2013-06-01: 3 mL via INTRAVENOUS

## 2013-06-01 MED ORDER — ASPIRIN EC 81 MG PO TBEC
81.0000 mg | DELAYED_RELEASE_TABLET | Freq: Every day | ORAL | Status: DC
  Administered 2013-06-01: 81 mg via ORAL
  Filled 2013-06-01 (×2): qty 1

## 2013-06-01 NOTE — ED Notes (Signed)
Pt with initial c/o L sided CP radiating down left arm.  Pt now denies pain.

## 2013-06-01 NOTE — ED Provider Notes (Signed)
CSN: [id number]     Arrival date & time 06/01/13  1037 History   First MD Initiated Contact with Patient 06/01/13 1049     Chief Complaint  Patient presents with  . Chest Pain   (Consider location/radiation/quality/duration/timing/severity/associated sxs/prior Treatment) Patient is a 79 y.o. male presenting with chest pain. The history is provided by the patient.  Chest Pain Associated symptoms: no abdominal pain, no back pain, no fever, no headache, no shortness of breath and not vomiting   pt c/o left sided upper/lateral chest pain radiating to left arm onset when awoke this morning. Pain constant. Dull. No specific exacerbating or alleviating factors. No associated sob, nv or diaphoresis. No unusual fatigue or doe. Denies hx similar pain. No pleuritic pain. No hx cad, dvt or pe. Denies cough or uri c/o. No fever or chills. Denies chest wall strain or sleeping awkwardly on chest/shoulder area.      Past Medical History  Diagnosis Date  . Syncope, near    History reviewed. No pertinent past surgical history. History reviewed. No pertinent family history. History  Substance Use Topics  . Smoking status: Former Smoker    Quit date: 02/01/1982  . Smokeless tobacco: Not on file  . Alcohol Use: No    Review of Systems  Constitutional: Negative for fever.  HENT: Negative for sore throat.   Eyes: Negative for redness.  Respiratory: Negative for shortness of breath.   Cardiovascular: Positive for chest pain. Negative for leg swelling.  Gastrointestinal: Negative for vomiting and abdominal pain.  Genitourinary: Negative for flank pain.  Musculoskeletal: Negative for back pain and neck pain.  Skin: Negative for rash.  Neurological: Negative for headaches.  Hematological: Does not bruise/bleed easily.  Psychiatric/Behavioral: Negative for confusion.    Allergies  Review of patient's allergies indicates no known allergies.  Home Medications   Current Outpatient Rx  Name   Route  Sig  Dispense  Refill  . alfuzosin (UROXATRAL) 10 MG 24 hr tablet   Oral   Take 10 mg by mouth daily.         . Multiple Vitamins-Minerals (MULTIVITAMIN PO)   Oral   Take 1 tablet by mouth daily.         Marland Kitchen omeprazole (PRILOSEC) 40 MG capsule   Oral   Take 40 mg by mouth daily.          BP 155/73  Pulse 80  Resp 18  SpO2 96% Physical Exam  Nursing note and vitals reviewed. Constitutional: He is oriented to person, place, and time. He appears well-developed and well-nourished. No distress.  HENT:  Mouth/Throat: Oropharynx is clear and moist.  Eyes: Conjunctivae are normal.  Neck: Neck supple. No tracheal deviation present.  Cardiovascular: Normal rate, regular rhythm, normal heart sounds and intact distal pulses.   Pulmonary/Chest: Effort normal and breath sounds normal. No accessory muscle usage. No respiratory distress. He exhibits no tenderness.  Abdominal: Soft. He exhibits no distension. There is no tenderness.  Musculoskeletal: Normal range of motion. He exhibits no edema and no tenderness.  No chest wall tenderness or pain w rom left shoulder.   Neurological: He is alert and oriented to person, place, and time.  Skin: Skin is warm and dry. He is not diaphoretic.  Psychiatric: He has a normal mood and affect.    ED Course  Procedures (including critical care time)  Results for orders placed during the hospital encounter of 06/01/13  CBC      Result Value Range  WBC 6.8  4.0 - 10.5 K/uL   RBC 4.60  4.22 - 5.81 MIL/uL   Hemoglobin 14.4  13.0 - 17.0 g/dL   HCT 16.1  09.6 - 04.5 %   MCV 92.4  78.0 - 100.0 fL   MCH 31.3  26.0 - 34.0 pg   MCHC 33.9  30.0 - 36.0 g/dL   RDW 40.9  81.1 - 91.4 %   Platelets 204  150 - 400 K/uL  BASIC METABOLIC PANEL      Result Value Range   Sodium 139  135 - 145 mEq/L   Potassium 4.1  3.5 - 5.1 mEq/L   Chloride 103  96 - 112 mEq/L   CO2 28  19 - 32 mEq/L   Glucose, Bld 95  70 - 99 mg/dL   BUN 13  6 - 23 mg/dL    Creatinine, Ser 7.82  0.50 - 1.35 mg/dL   Calcium 9.4  8.4 - 95.6 mg/dL   GFR calc non Af Amer 59 (*) >90 mL/min   GFR calc Af Amer 68 (*) >90 mL/min  POCT I-STAT TROPONIN I      Result Value Range   Troponin i, poc 0.00  0.00 - 0.08 ng/mL   Comment 3            Dg Chest 2 View  06/01/2013   CLINICAL DATA:  Chest pain.  EXAM: CHEST  2 VIEW  COMPARISON:  None.  FINDINGS: Loop recorder is noted. There is some linear atelectasis or scarring in the lung bases. The lungs are otherwise clear with some apical scarring noted. No pneumothorax or pleural fluid. Heart size is normal.  IMPRESSION: No active cardiopulmonary disease. Mild bibasilar atelectasis or scar.   Electronically Signed   By: Drusilla Kanner M.D.   On: 06/01/2013 12:10      EKG Interpretation    Date/Time:  Thursday June 01 2013 10:43:24 EST Ventricular Rate:  77 PR Interval:  184 QRS Duration: 64 QT Interval:  364 QTC Calculation: 411 R Axis:   65 Text Interpretation:  Normal sinus rhythm Normal ECG Confirmed by Jahron Hunsinger  MD, Journei Thomassen ([id number]) on 06/01/2013 12:21:37 PM            MDM  Iv ns. Labs. Cxr. Ecg.  Reviewed nursing notes and prior charts for additional history.    Date: 06/01/2013  Rate: 77  Rhythm: normal sinus rhythm  QRS Axis: normal  Intervals: normal  ST/T Wave abnormalities: normal  Conduction Disutrbances:none  Narrative Interpretation:   Old EKG Reviewed: unchanged  Recheck pt comfortable.  [place] cardiology called to see pt.      Suzi Roots, MD 06/01/13 [phone]

## 2013-06-01 NOTE — H&P (Signed)
Patient ID: Colton Bennett MRN: [id number] DOB/AGE: 77-Aug-1935 77 y.o. Admit date: 06/01/2013  Reason for Admission: Chest pain  Primary Cardiologist: Nahser  HPI: 79 yo male with history of syncope and former tobacco abuse (3ppd x 30 years, quit 1983) who presents to the ED today after an episode of left sided chest pain. His pain began this am and awakened him at 4am. Pain was in center of his chest, burning type pain with radiation to left arm. Pain persisted until he was seen in the ED. No associated N/V/diaphoresis or dyspnea. He is currently chest pain free. Recent admission September 2014 for loop recorder implantation after a syncopal event on 01/14/13 when he had sudden collapse. He wore a 30 day event monitor and was seen by Dr. Elease Hashimoto then referred to EP where he was seen by Dr. Johney Frame. Event monitor showed normal sinus rhythm but did not have further symptoms while wearing this monitor so loop recorder was placed. He had an echocardiogram July 2014 which revealed normal left ventricle systolic function with an ejection fraction of 50-55%. He had normal wall motion. He had trivial aortic insufficiency, mild mitral regurgitation. The left atrium was mildly to moderately dilated.   Review of systems complete and found to be negative unless listed above   Past Medical History  Diagnosis Date  . Syncope, near     Past Surgical History: Loop recorder implant 9/14  Family History  Problem Relation Age of Onset  . CAD Neg Hx     History   Social History  . Marital Status: Married    Spouse Name: N/A    Number of Children: 1  . Years of Education: N/A   Occupational History  . 'retired    Social History Main Topics  . Smoking status: Former Smoker -- 3.00 packs/day for 30 years    Types: Cigarettes    Quit date: 02/01/1982  . Smokeless tobacco: Not on file  . Alcohol Use: No  . Drug Use: No  . Sexual Activity: Not on file   Other Topics Concern  . Not on file    Social History Narrative  . No narrative on file     No Known Allergies  Prior to Admission Meds See computer list Current outpatient prescriptions:Multiple Vitamins-Minerals (MULTIVITAMIN PO), Take 1 tablet by mouth daily., Disp: , Rfl: ;  omeprazole (PRILOSEC) 40 MG capsule, Take 40 mg by mouth daily., Disp: , Rfl:   Physical Exam: Blood pressure 155/73, pulse 80, resp. rate 18, SpO2 96.00%.    General: Well developed, well nourished, NAD  HEENT: OP clear, mucus membranes moist  SKIN: warm, dry. No rashes.  Neuro: No focal deficits  Musculoskeletal: Muscle strength 5/5 all ext  Psychiatric: Mood and affect normal  Neck: No JVD, no carotid bruits, no thyromegaly, no lymphadenopathy.  Lungs:Clear bilaterally, no wheezes, rhonci, crackles  Cardiovascular: Regular rate and rhythm. No murmurs, gallops or rubs.  Abdomen:Soft. Bowel sounds present. Non-tender.  Extremities: No lower extremity edema. Pulses are 2 + in the bilateral DP/PT.  Labs:   Lab Results  Component Value Date   WBC 6.8 06/01/2013   HGB 14.4 06/01/2013   HCT 42.5 06/01/2013   MCV 92.4 06/01/2013   PLT 204 06/01/2013     Recent Labs Lab 06/01/13 1115  NA 139  K 4.1  CL 103  CO2 28  BUN 13  CREATININE 1.16  CALCIUM 9.4  GLUCOSE 95      Chest x-ray:  06/01/13: IMPRESSION:  No active cardiopulmonary disease. Mild bibasilar atelectasis or  scar.  EKG: NSR, no ischemic changes.   ASSESSMENT AND PLAN:   1. Chest pain: He is not known to have CAD but he does have a history of tobacco abuse. No personal history of DM, HTN or HLD. Recent syncope of unexplained etiology. Will admit to telemetry. Cycle cardiac markers. EKG in am. If no further chest pain and markers negative, can likely be discharged home tomorrow with plans for outpatient stress myoview. If he rules in for MI, will need cardiac cath. Continue PPI with history of GERD.   2. History of Syncope: no recurrence since July 2014 but  continues to have episodes of dizziness.   Earney Hamburg, MD 06/01/2013, 1:10 PM

## 2013-06-01 NOTE — ED Notes (Signed)
Per pt sts chest pain since 4 this am. sts left sided chest pain radiating down left arm that is constant. Took 1 ASA. Denies SOB, nausea. sts he has a loop recorder.

## 2013-06-02 ENCOUNTER — Other Ambulatory Visit: Payer: Self-pay | Admitting: Physician Assistant

## 2013-06-02 DIAGNOSIS — R0789 Other chest pain: Secondary | ICD-10-CM

## 2013-06-02 LAB — BASIC METABOLIC PANEL
BUN: 14 mg/dL (ref 6–23)
CO2: 29 mEq/L (ref 19–32)
Calcium: 9.2 mg/dL (ref 8.4–10.5)
Chloride: 103 mEq/L (ref 96–112)
GFR calc Af Amer: 71 mL/min — ABNORMAL LOW (ref >90)
GFR calc non Af Amer: 61 mL/min — ABNORMAL LOW (ref >90)
Potassium: 4.6 mEq/L (ref 3.5–5.1)
Sodium: 138 mEq/L (ref 135–145)

## 2013-06-02 LAB — CBC
HCT: 41.9 % (ref 39.0–52.0)
MCV: 92.5 fL (ref 78.0–100.0)
Platelets: 202 10*3/uL (ref 150–400)
RBC: 4.53 MIL/uL (ref 4.22–5.81)
WBC: 9 10*3/uL (ref 4.0–10.5)

## 2013-06-02 NOTE — Discharge Summary (Signed)
CARDIOLOGY DISCHARGE SUMMARY   Patient ID: Colton Bennett MRN: [id number] DOB/AGE: 1934/06/11 79 y.o.  Admit date: 06/01/2013 Discharge date: 06/02/2013  Primary Discharge Diagnosis:      Chest pain, mid sternal   Procedures: Two-view chest x-ray Hospital Course: Colton Bennett is a 79 y.o. male with no history of CAD. He had some left-sided chest pain he came to the emergency room where he was admitted for further evaluation and treatment.  His cardiac enzymes were negative for MI. His chest x-ray did not show any acute disease or problem. He has a history of syncope and has had a loop recorder placed, but had no problems with the site and no significant arrhythmia was noted on telemetry.  On 06/02/2013, his chest pain had improved. He was evaluated by Dr. Elease Hashimoto and considered stable for discharge, to followup in the office with a stress test.  Labs:   Lab Results  Component Value Date   WBC 9.0 06/02/2013   HGB 14.3 06/02/2013   HCT 41.9 06/02/2013   MCV 92.5 06/02/2013   PLT 202 06/02/2013     Recent Labs Lab 06/02/13 0214  NA 138  K 4.6  CL 103  CO2 29  BUN 14  CREATININE 1.12  CALCIUM 9.2  GLUCOSE 100*    Recent Labs  06/01/13 1455 06/01/13 2000 06/02/13 0214  TROPONINI <0.30 <0.30 <0.30      Radiology: Dg Chest 2 View 06/01/2013   CLINICAL DATA:  Chest pain.  EXAM: CHEST  2 VIEW  COMPARISON:  None.  FINDINGS: Loop recorder is noted. There is some linear atelectasis or scarring in the lung bases. The lungs are otherwise clear with some apical scarring noted. No pneumothorax or pleural fluid. Heart size is normal.  IMPRESSION: No active cardiopulmonary disease. Mild bibasilar atelectasis or scar.   Electronically Signed   By: Drusilla Kanner M.D.   On: 06/01/2013 12:10   EKG: 06/02/2013 Sinus rhythm   Vent. rate 62 BPM PR interval 174 ms QRS duration 82 ms QT/QTc 408/414 ms P-R-T axes 26 17 47  FOLLOW UP PLANS AND APPOINTMENTS No Known  Allergies   Medication List         MULTIVITAMIN PO  Take 1 tablet by mouth daily.     omeprazole 40 MG capsule  Commonly known as:  PRILOSEC  Take 40 mg by mouth daily.        Discharge Orders   Future Appointments Provider Department Dept Phone   06/07/2013 8:30 AM [facility] [place] SITE 3 NUCLEAR MED [phone]   06/16/2013 11:10 AM Beatrice Lecher, PA-C [facility] [place] Office [phone]   07/17/2013 10:30 AM Huston Foley, MD [facility] [phone]   Future Orders Complete By Expires   Diet - low sodium heart healthy  As directed    Increase activity slowly  As directed      Follow-up Information   Follow up with [facility] On 06/07/2013. (Stress test, come at 8:15 for an 8:30 appointment.)    Contact information:   [address] [place] [address]       Follow up with Tereso Newcomer, PA-C On 06/16/2013. (At 11:10 AM)    Specialty:  Physician Assistant   Contact information:   [address] [place] [address] [phone]       BRING ALL MEDICATIONS WITH YOU TO FOLLOW UP APPOINTMENTS  Time  spent with patient to include physician time: 36 min Signed: Theodore Demark, PA-C 06/02/2013, 9:48 AM Co-Sign MD  Attending Note:   The patient was seen and examined.  Agree with assessment and plan as noted above.  Changes made to the above note as needed.  His cp sounds non-cardiac.  Will get a myoview as OP. See my note from earlier today  Alvia Grove., MD, [facility] 06/02/2013, 6:08 PM

## 2013-06-02 NOTE — Progress Notes (Signed)
Patient ID: Colton Bennett MRN: [id number] DOB/AGE: 1933/07/31 79 y.o. Admit date: 06/01/2013  Reason for Admission: Chest pain  Primary Cardiologist: Nahser  HPI: 79 yo male with history of syncope and former tobacco abuse (3ppd x 30 years, quit 1983) who presents to the ED today after an episode of left sided chest pain. His pain began this am and awakened him at 4am. Pain was in center of his chest, burning type pain with radiation to left arm. Pain persisted until he was seen in the ED. No associated N/V/diaphoresis or dyspnea. He is currently chest pain free. Recent admission September 2014 for loop recorder implantation after a syncopal event on 01/14/13 when he had sudden collapse. He wore a 30 day event monitor and was seen by Dr. Elease Hashimoto then referred to EP where he was seen by Dr. Johney Frame. Event monitor showed normal sinus rhythm but did not have further symptoms while wearing this monitor so loop recorder was placed. He had an echocardiogram July 2014 which revealed normal left ventricle systolic function with an ejection fraction of 50-55%. He had normal wall motion. He had trivial aortic insufficiency, mild mitral regurgitation. The left atrium was mildly to moderately dilated.   Review of systems complete and found to be negative unless listed above   The pain is better.  He denies any pleuretic component.  The pain worsened slightly with arm movement.   Past Medical History  Diagnosis Date  . Syncope, near     Past Surgical History: Loop recorder implant 9/14  Family History  Problem Relation Age of Onset  . CAD Neg Hx     History   Social History  . Marital Status: Married    Spouse Name: N/A    Number of Children: 1  . Years of Education: N/A   Occupational History  . 'retired    Social History Main Topics  . Smoking status: Former Smoker -- 3.00 packs/day for 30 years    Types: Cigarettes    Quit date: 02/01/1982  . Smokeless tobacco: Not on file  . Alcohol  Use: No  . Drug Use: No  . Sexual Activity: Not on file   Other Topics Concern  . Not on file   Social History Narrative  . No narrative on file     No Known Allergies  Prior to Admission Meds See computer list Current outpatient prescriptions:Multiple Vitamins-Minerals (MULTIVITAMIN PO), Take 1 tablet by mouth daily., Disp: , Rfl: ;  omeprazole (PRILOSEC) 40 MG capsule, Take 40 mg by mouth daily., Disp: , Rfl:   Physical Exam: Blood pressure 107/55, pulse 58, temperature 97.8 F (36.6 C), temperature source Oral, resp. rate 18, height 6' (1.829 m), SpO2 94.00%.    General: Well developed, well nourished, NAD  HEENT: OP clear, mucus membranes moist  SKIN: warm, dry. No rashes.  Neuro: No focal deficits  Musculoskeletal: Muscle strength 5/5 all ext  Psychiatric: Mood and affect normal  Neck: No JVD, no carotid bruits, no thyromegaly, no lymphadenopathy.  Lungs:Clear bilaterally, no wheezes, rhonci, crackles  Cardiovascular: Regular rate and rhythm. No murmurs, gallops or rubs.  Abdomen:Soft. Bowel sounds present. Non-tender.  Extremities: No lower extremity edema. Pulses are 2 + in the bilateral DP/PT.  Labs:   Lab Results  Component Value Date   WBC 9.0 06/02/2013   HGB 14.3 06/02/2013   HCT 41.9 06/02/2013   MCV 92.5 06/02/2013   PLT 202 06/02/2013     Recent Labs Lab 06/02/13  0214  NA 138  K 4.6  CL 103  CO2 29  BUN 14  CREATININE 1.12  CALCIUM 9.2  GLUCOSE 100*      Chest x-ray:  06/01/13: IMPRESSION:  No active cardiopulmonary disease. Mild bibasilar atelectasis or  scar.  EKG: NSR, no ischemic changes.   ASSESSMENT AND PLAN:   1. Chest pain: He is not known to have CAD but he does have a history of tobacco abuse. Troponin levels are negative.  ECG is normal. He is at low risk on the HEART SCORE.  Will be able to go home today.  Will set him up for a stress myoview next week and follow up with Lawson Fiscal or [employer].    2. History of Syncope: no  recurrence since July 2014 but continues to have episodes of dizziness.       Vesta Mixer, Montez Hageman., MD, [facility] 06/02/2013, 8:27 AM Office - [phone] Pager [phone]

## 2013-06-02 NOTE — Progress Notes (Signed)
At 0435 pt had a 4.29 second pause.  Pt asymptomatic.  MD paged, awaiting response.  Will continue to monitor.

## 2013-06-06 ENCOUNTER — Encounter: Payer: Self-pay | Admitting: Neurology

## 2013-06-07 ENCOUNTER — Ambulatory Visit (HOSPITAL_COMMUNITY): Payer: Medicare Other | Attending: Cardiovascular Disease | Admitting: Radiology

## 2013-06-07 ENCOUNTER — Encounter: Payer: Self-pay | Admitting: Cardiology

## 2013-06-07 VITALS — BP 140/68 | HR 54 | Ht 72.0 in | Wt 203.0 lb

## 2013-06-07 DIAGNOSIS — Z87891 Personal history of nicotine dependence: Secondary | ICD-10-CM | POA: Insufficient documentation

## 2013-06-07 DIAGNOSIS — R079 Chest pain, unspecified: Secondary | ICD-10-CM

## 2013-06-07 DIAGNOSIS — R0789 Other chest pain: Secondary | ICD-10-CM | POA: Insufficient documentation

## 2013-06-07 MED ORDER — TECHNETIUM TC 99M SESTAMIBI GENERIC - CARDIOLITE
10.0000 | Freq: Once | INTRAVENOUS | Status: AC | PRN
  Administered 2013-06-07: 10 via INTRAVENOUS

## 2013-06-07 MED ORDER — TECHNETIUM TC 99M SESTAMIBI GENERIC - CARDIOLITE
30.0000 | Freq: Once | INTRAVENOUS | Status: AC | PRN
  Administered 2013-06-07: 30 via INTRAVENOUS

## 2013-06-07 NOTE — Progress Notes (Signed)
[facility] SITE 3 NUCLEAR MED [address] [place], [place] [address] [phone]    Cardiology Nuclear Med Study  Colton Bennett is a 79 y.o. male     MRN : [id number]     DOB: 1933-10-23  Procedure Date: 06/07/2013  Nuclear Med Background Indication for Stress Test:  Evaluation for Ischemia and Post Hospital: 06/01/13 CP with enzymes EKG History:  01/2013 ECHO: EF: 50-55%  Cardiac Risk Factors: History of Smoking  Symptoms:  Chest Pain (last date of chest discomfort 1 week ago)   Nuclear Pre-Procedure Caffeine/Decaff Intake:  None NPO After: 6 pm   Lungs:  clear O2 Sat: 98% on room air. IV 0.9% NS with Angio Cath:  20g  IV Site: R Antecubital  IV Started by:  Frederick Peers, EMT-P  Chest Size (in):  44 Cup Size: n/a  Height: 6' (1.829 m)  Weight:  203 lb (92.08 kg)  BMI:  Body mass index is 27.53 kg/(m^2). Tech Comments:  Rx this am    Nuclear Med Study 1 or 2 day study: 1 day  Stress Test Type:  Stress  Reading MD: Tobias Alexander MD  Order Authorizing Provider:  Katherina Right MD  Resting Radionuclide: Technetium 24m Sestamibi  Resting Radionuclide Dose: 11.0 mCi   Stress Radionuclide:  Technetium 16m Sestamibi  Stress Radionuclide Dose: 33.0 mCi           Stress Protocol Rest HR: 54 Stress HR: 133  Rest BP: 140/68 Stress BP: 170/65  Exercise Time (min): 5:00 METS: 7.0   Predicted Max HR: 143 bpm % Max HR: 93.01 bpm Rate Pressure Product: 21308   Dose of Adenosine (mg):  n/a Dose of Lexiscan: n/a mg  Dose of Atropine (mg): n/a Dose of Dobutamine: n/a mcg/kg/min (at max HR)  Stress Test Technologist: Shuntay Everetts Chimes, BS-ES  Nuclear Technologist:  Doyne Keel, CNMT     Rest Procedure:  Myocardial perfusion imaging was performed at rest 45 minutes following the intravenous administration of Technetium 5m Sestamibi. Rest ECG: NSR - Normal EKG  Stress Procedure:  The patient exercised on the treadmill utilizing the Bruce Protocol for 5:00 minutes. The  patient stopped due to fatigue and denied any chest pain.  Technetium 53m Sestamibi was injected at peak exercise and myocardial perfusion imaging was performed after a brief delay. Stress ECG: No significant change from baseline ECG  QPS Raw Data Images:  Normal; no motion artifact; normal heart/lung ratio. Stress Images:  Normal homogeneous uptake in all areas of the myocardium. Rest Images:  Normal homogeneous uptake in all areas of the myocardium. Subtraction (SDS):  No evidence of ischemia. Transient Ischemic Dilatation (Normal <1.22):  0.90 Lung/Heart Ratio (Normal <0.45):  0.25  Quantitative Gated Spect Images QGS EDV:  107 ml QGS ESV:  47 ml  Impression Exercise Capacity:  Good exercise capacity. BP Response:  Normal blood pressure response. Clinical Symptoms:  No significant symptoms noted. ECG Impression:  No significant ST segment change suggestive of ischemia. Comparison with Prior Nuclear Study: No images to compare  Overall Impression:  Normal stress nuclear study.  LV Ejection Fraction: 56%.  LV Wall Motion:  NL LV Function; NL Wall Motion  Tobias Alexander, Rexene Edison 06/07/2013

## 2013-06-11 NOTE — Progress Notes (Signed)
Quick Note:  I reviewed the patient's CPAP compliance data from 04/17/2013 to 05/16/2013, which is a total of 30 days, during which time the patient used CPAP every day. The average usage for all days was 7 hours and 24 minutes. The percent used days greater than 4 hours was 100%, indicating excellent compliance. The residual AHI was 2.3 per hour, indicating an appropriate treatment pressure of 8 cwp with EPR of 2. I will review this data with the patient at the next office visit, provide feedback and additional troubleshooting if need be.  Huston Foley, MD, PhD [facility] ([facility])   ______

## 2013-06-13 ENCOUNTER — Ambulatory Visit (INDEPENDENT_AMBULATORY_CARE_PROVIDER_SITE_OTHER): Payer: Medicare Other | Admitting: *Deleted

## 2013-06-13 ENCOUNTER — Encounter: Payer: Self-pay | Admitting: Neurology

## 2013-06-13 DIAGNOSIS — R55 Syncope and collapse: Secondary | ICD-10-CM

## 2013-06-13 LAB — MDC_IDC_ENUM_SESS_TYPE_REMOTE

## 2013-06-14 ENCOUNTER — Telehealth: Payer: Self-pay | Admitting: *Deleted

## 2013-06-14 NOTE — Telephone Encounter (Signed)
Pt had pause episode on 06-02-13 on ILR. LMOM for return call to see if pt was symptomatic.

## 2013-06-15 ENCOUNTER — Encounter: Payer: Self-pay | Admitting: Internal Medicine

## 2013-06-16 ENCOUNTER — Ambulatory Visit (INDEPENDENT_AMBULATORY_CARE_PROVIDER_SITE_OTHER): Payer: Medicare Other | Admitting: Physician Assistant

## 2013-06-16 ENCOUNTER — Encounter: Payer: Self-pay | Admitting: Physician Assistant

## 2013-06-16 VITALS — BP 114/64 | HR 66 | Ht 72.0 in | Wt 208.8 lb

## 2013-06-16 DIAGNOSIS — R55 Syncope and collapse: Secondary | ICD-10-CM

## 2013-06-16 DIAGNOSIS — R0789 Other chest pain: Secondary | ICD-10-CM

## 2013-06-16 DIAGNOSIS — R072 Precordial pain: Secondary | ICD-10-CM

## 2013-06-16 NOTE — Patient Instructions (Signed)
PLEASE FOLLOW UP WITH DR. Elease Hashimoto IN MARCH 2015  NO CHANGES WERE MADE TODAY

## 2013-06-16 NOTE — Progress Notes (Signed)
  [address], [address] [place], [place]  [address] Phone: [phone] Fax:  [phone]  Date:  06/16/2013   ID:  Colton Bennett, DOB 09/20/1933, MRN [id number]  PCP:  Josue Hector, MD  Cardiologist:  Dr. Delane Ginger   Electrophysiologist:  Dr. Hillis Range (implanted ILR)   History of Present Illness: Colton Bennett is a 79 y.o. male with a history of syncope in 01/2013. At that time, an event monitor was negative. He saw Dr. Johney Frame who implanted a loop recorder.  Echocardiogram (01/2013): EF of 50-55%, normal wall motion, trivial AI, mild MR, mild to moderate LAE. He was recently admitted 11/20-11/21 with chest discomfort. Cardiac markers remained normal. Chest x-ray was normal. He was discharged home with plans for outpatient stress perfusion study.  ETT-Myoview (06/07/13): No ischemia, EF 56%; normal study.  He denies any further chest pain. He denies significant dyspnea. He denies orthopnea, PND or edema. He sleeps with a CPAP. He denies further syncope.  Recent Labs: 02/01/2013: ALT 13; TSH 3.440  06/02/2013: Creatinine 1.12; Hemoglobin 14.3; Potassium 4.6   Wt Readings from Last 3 Encounters:  06/07/13 203 lb (92.08 kg)  05/18/13 207 lb (93.895 kg)  04/05/13 195 lb (88.451 kg)     Past Medical History  Diagnosis Date  . Syncope, near   . History of echocardiogram     Echocardiogram (01/2013): EF of 50-55%, normal wall motion, trivial AI, mild MR, mild to moderate LAE  . History of cardiovascular stress test     ETT-Myoview (06/07/13): No ischemia, EF 56%; normal study.    Current Outpatient Prescriptions  Medication Sig Dispense Refill  . Multiple Vitamins-Minerals (MULTIVITAMIN PO) Take 1 tablet by mouth daily.      Marland Kitchen omeprazole (PRILOSEC) 40 MG capsule Take 40 mg by mouth daily.       No current facility-administered medications for this visit.    Allergies:   Review of patient's allergies indicates no known allergies.   Social History:  The patient   reports that he quit smoking about 31 years ago. His smoking use included Cigarettes. He has a 90 pack-year smoking history. He does not have any smokeless tobacco history on file. He reports that he does not drink alcohol or use illicit drugs.   Family History:  The patient's family history is negative for CAD.   ROS:  Please see the history of present illness.   All other systems reviewed and negative.   PHYSICAL EXAM: VS:  BP 114/64  Pulse 66  Ht 6' (1.829 m)  Wt 208 lb 12.8 oz (94.711 kg)  BMI 28.31 kg/m2 Well nourished, well developed, in no acute distress HEENT: normal Neck: no JVD Cardiac:  normal S1, S2; RRR; no murmur Lungs:  clear to auscultation bilaterally, no wheezing, rhonchi or rales Abd: soft, nontender, no hepatomegaly Ext: no edema Skin: warm and dry Neuro:  CNs 2-12 intact, no focal abnormalities noted  EKG:  NSR, HR 66, normal axis, no changes     ASSESSMENT AND PLAN:  1. Chest Pain:  Recent myoview normal.  No further symptoms. No further cardiac workup indicated. 2. Syncope:  No further recurrence. He is status post ILR. 3. Disposition:  F/u with Dr. Elease Hashimoto in March 2015  Signed, Tereso Newcomer, [place]  06/16/2013 10:23 AM

## 2013-06-16 NOTE — Telephone Encounter (Signed)
LMOM for pt to return call/ pause episode/kwm

## 2013-07-05 ENCOUNTER — Encounter: Payer: Self-pay | Admitting: Internal Medicine

## 2013-07-17 ENCOUNTER — Encounter: Payer: Self-pay | Admitting: Neurology

## 2013-07-17 ENCOUNTER — Ambulatory Visit (INDEPENDENT_AMBULATORY_CARE_PROVIDER_SITE_OTHER): Payer: Medicare Other | Admitting: Neurology

## 2013-07-17 VITALS — BP 128/80 | HR 109 | Temp 98.0°F | Ht 72.0 in | Wt 208.0 lb

## 2013-07-17 DIAGNOSIS — R42 Dizziness and giddiness: Secondary | ICD-10-CM

## 2013-07-17 DIAGNOSIS — R110 Nausea: Secondary | ICD-10-CM

## 2013-07-17 DIAGNOSIS — G4733 Obstructive sleep apnea (adult) (pediatric): Secondary | ICD-10-CM

## 2013-07-17 NOTE — Patient Instructions (Addendum)
Please continue using your CPAP regularly. While your insurance requires that you use CPAP at least 4 hours each night on 70% of the nights, I recommend, that you not skip any nights and use it throughout the night if you can. Getting used to CPAP does take time and patience and discipline. Untreated obstructive sleep apnea when it is moderate to severe can have an adverse impact on cardiovascular health and raise her risk for heart disease, arrhythmias, hypertension, congestive heart failure, stroke and diabetes. Untreated obstructive sleep apnea causes sleep disruption, nonrestorative sleep, and sleep deprivation. This can have an impact on your day to day functioning and cause daytime sleepiness and impairment of cognitive function, memory loss, mood disturbance, and problems focussing. Using CPAP regularly can improve these symptoms.  Keep up the good work! I will see you back in 6 months for sleep apnea check up, and if you continue to do well on CPAP I will see you once a year thereafter.   Drink more water, reduce soda intake and walk regularly.

## 2013-07-17 NOTE — Progress Notes (Signed)
Subjective:    Patient ID: Colton Bennett is a 80 y.o. male.  HPI Interim history:  Colton Bennett is a very pleasant 80 year old right-handed woman with an underlying medical history of reflux disease and benign prostate hypertrophy who presents for followup consultation of his OSA. He is accompanied by his wife again today. I last saw him on 05/18/2013 for followup of his syncope and collapse as well as after his sleep study. At the time, we discussed all his test results including blood work (ANA, CRP, CBC, CK, hemoglobin A1c, CMP, serum electrophoresis, RF, RPR, ESR, TSH), echocardiogram, EEG, cardiac event monitor, and MRA neck and head and MRI of brain as well as his sleep study. His labs were essentially unremarkable, with the exception of his hemoglobin A1c at 5.8. His TSH was normal. His MRI brain showed white matter changes, and repeat MRI brain with contrast showed no abnormal contrast enhancement. His MRA neck and head were unremarkable. His EEG was unremarkable. He had a baseline sleep study on 02/08/2013 which demonstrated a total AHI of 12 per hour, a severe REM related OSA with a REM-related AHI of 48 per hour and an oxyhemoglobin desaturation nadir of 83%. Based on those results I asked him to return for CPAP titration study which he had on 03/21/2013, his CPAP was titrated from 5-9 cm with almost complete resolution of his sleep disordered breathing on a pressure of 8 cm. He was placed on CPAP at a pressure of 8 cm with large nasal pillows.  I reviewed his compliance data from 04/17/2013 through 05/17/2013 which is a total of 31 days during which time he used CPAP every day. His percent used days greater than 4 hours was 100% indicating excellent compliance. His average usage was 7 hours and 24 minutes. His leak was not very high. His residual AHI was 2.2 per hour indicating an appropriate treatment pressure of 8 cm of water with EPR of 2.  He had a 30 day Holter monitor which showed no sustained  irregularities but some PVCs. Since then, in late September he had a implantable loop recorder placed.  Has been experiencing periods of dizziness and blurry vision as well as nausea and vomiting for the past several years, probably at least 2 years. On 01/12/13 he had parked his car in a parking lot and was on his way to the store and collapsed. He had no warning signs, no palpitations, and claims he had no LOC, no HA, SOB. Some 2 years ago, he had an episode of L facial weakness. No hx of staring spells, no twitching, no shaking, no automatism, except occasional lip smacking while driving or while eating.  In the interim, he presented to the emergency room with midsternal chest pain on 06/01/2013 and was admitted for one day with negative cardiac workup. He was scheduled for an outpatient stress test, which he had on 06/07/2013 and it was negative. He has been seen by cardiology outpatient and needs no further w/u from their end at this time and he still has the loop recorder in place.   Today, he reports feeling a little nauseated and has an upset stomach. He ate a cheeseburger and fries from [facility] last night. His wife did not eat the same dinner but also feels nauseated and in fact had to leave the room to because of nausea and vomiting. She also has had some congestion. His headaches had improved but returned about 3 days ago. They are not severe. He feels  an occasional or intermittent lightheadedness or dizziness. No vertigo as described. This can happen while sitting or standing or lying down. I also reviewed his most recent compliance data from 06/17/2013 through 07/16/2013 which is a total of 30 days during which time he was 100% compliant with treatment with a percent used days greater than 4 hours of 100%, indicating excellent compliance. His residual AHI was 2.5 per hour indicating an appropriate treatment pressure of 8 cm with EPR of 2. His average usage was 7 hours and 59 minutes.  His Past  Medical History Is Significant For: Past Medical History  Diagnosis Date  . Syncope, near   . History of echocardiogram     Echocardiogram (01/2013): EF of 50-55%, normal wall motion, trivial AI, mild MR, mild to moderate LAE  . History of cardiovascular stress test     ETT-Myoview (06/07/13): No ischemia, EF 56%; normal study.    His Past Surgical History Is Significant For: Past Surgical History  Procedure Laterality Date  . Loop recorder implant  September 2014    His Family History Is Significant For: Family History  Problem Relation Age of Onset  . CAD Neg Hx     His Social History Is Significant For: History   Social History  . Marital Status: Married    Spouse Name: N/A    Number of Children: 1  . Years of Education: N/A   Occupational History  . 'retired    Social History Main Topics  . Smoking status: Former Smoker -- 3.00 packs/day for 30 years    Types: Cigarettes    Quit date: 02/01/1982  . Smokeless tobacco: None  . Alcohol Use: No  . Drug Use: No  . Sexual Activity: None   Other Topics Concern  . None   Social History Narrative  . None    His Allergies Are:  No Known Allergies:   His Current Medications Are:  Outpatient Encounter Prescriptions as of 07/17/2013  Medication Sig  . Multiple Vitamins-Minerals (MULTIVITAMIN PO) Take 1 tablet by mouth daily.  Marland Kitchen omeprazole (PRILOSEC) 40 MG capsule Take 40 mg by mouth daily.  :  Review of Systems:  Out of a complete 14 point review of systems, all are reviewed and negative with the exception of these symptoms as listed below:  Review of Systems  Constitutional: Negative.   HENT: Positive for hearing loss.   Eyes: Negative.   Respiratory: Negative.   Cardiovascular: Negative.   Gastrointestinal: Negative.   Endocrine: Negative.   Genitourinary: Negative.   Musculoskeletal: Negative.   Skin: Negative.   Allergic/Immunologic: Negative.   Neurological: Positive for dizziness and headaches.   Hematological: Negative.   Psychiatric/Behavioral: Negative.     Objective:  Neurologic Exam  Physical Exam Physical Examination:   Filed Vitals:   07/17/13 1042  BP: 128/80  Pulse: 109  Temp: 98 F (36.7 C)    General Examination: The patient is a very pleasant 80 y.o. male in no acute distress. He appears well-developed and well-nourished and well groomed and is in less good spirits today.   HEENT: Normocephalic, atraumatic, pupils are equal, round and reactive to light and accommodation. Funduscopic exam is normal. Extraocular tracking is good without limitation to gaze excursion or nystagmus noted. Normal smooth pursuit is noted. Hearing is grossly intact. Face is slight asymmetric, unchanged. Speech is clear with no dysarthria noted. There is no hypophonia. There is no lip, neck/head, jaw or voice tremor. Neck is supple with full range of passive  and active motion. There are no carotid bruits on auscultation. Oropharynx exam reveals: mild mouth dryness, adequate dental hygiene and moderate airway crowding, due to elongated uvula and redundant soft palate. Mallampati is class II. Tongue protrudes centrally and palate elevates symmetrically.   Chest: Clear to auscultation without wheezing, rhonchi or crackles noted.  Heart: S1+S2+0, regular and normal without murmurs, rubs or gallops noted. He does seem to have an occasional extra heartbeat with a pause.  Abdomen: Soft, non-tender and non-distended with normal bowel sounds appreciated on auscultation.  Extremities: There is no pitting edema in the distal lower extremities bilaterally. Pedal pulses are intact.  Skin: Warm and dry without trophic changes noted. There are no varicose veins. He has vitiligo. He bruises easily and has small, quarter-sized bruise on his L forearm.  Musculoskeletal: exam reveals no obvious joint deformities, tenderness or joint swelling or erythema.   Neurologically:  Mental status: The patient is  awake, alert and oriented in all 4 spheres. His memory, attention, language and knowledge are appropriate. There is no aphasia, agnosia, apraxia or anomia. Speech is clear with normal prosody and enunciation. Thought process is linear. Mood is congruent and affect is normal.  Cranial nerves are as described above under HEENT exam. In addition, shoulder shrug is normal with equal shoulder height noted. Motor exam: Normal bulk, strength and tone is noted. There is no drift, tremor or rebound. Romberg is negative, but there was mild sway. Reflexes are 2+ throughout. Fine motor skills are intact. Cerebellar testing shows no dysmetria or intention tremor on finger to nose testing. There is no truncal or gait ataxia. Sensory exam is intact to light touch.  Gait, station and balance: he stands without difficulty. No veering to one side is noted. No leaning to one side is noted. No dizziness reported. Posture is age-appropriate and stance is narrow based. No problems turning are noted. He turns en bloc.   Assessment and Plan:   In summary, Mivaan Corbitt is a very pleasant 80 year old male with complaints of dizziness, SOBOE, syncope and collapse, intermittent blurry vision, and recurrent HAs. He has had an extensive workup including imaging studies, EEG, sleep study, Holter monitor and now has an implanted loop monitor. Thus far, we do not have a clear etiology of his syncopal event or has complaints of intermittent dizziness. His sleep study did confirm obstructive sleep apnea and he is now on CPAP with good tolerance and improvement of his daytime tiredness, and shortness of breath, but not necessarily improvement of his recurrent complaints of dizziness. I am not sure how to explain his recurrent milder and short-lived headaches and his intermittent lightheadedness or dizziness. He is asking whether he should have a cardiac catheterization done. I explained to him that none of his workup including his recent stress  test in his neck MRA suggest any significant atherosclerotic disease or coronary artery disease to justify an invasive test like that. I have advised him to bring this up for discussion with his cardiologist, Dr. Acie Fredrickson. His followup appointment with him is coming up soon. He indicates good results with the use of CPAP, and good tolerance of the pressure and mask. I reviewed the compliance data with the patient and encouraged him to continue to use CPAP regularly to help reduce cardiovascular risk. I am very pleased with his compliance and congratulated him on being very adherent to therapy.  We also talked about trying to maintaining a healthy lifestyle in general. I encouraged the patient to eat  healthy, exercise daily and keep well hydrated, to keep a scheduled bedtime and wake time routine, to not skip any meals and eat healthy snacks in between meals and to have protein with every meal. I stressed the importance of regular exercise.   I answered all their questions today and the patient and his wife were in agreement with the above outlined plan. I would like to see the patient back in 6 months, sooner if the need arises and encouraged them to call with any interim questions, concerns, problems or updates.  Most of my 25 minute visit today was spent in counseling and coordination of care, reviewing test results and reviewing medication.

## 2013-07-18 ENCOUNTER — Encounter: Payer: Self-pay | Admitting: Neurology

## 2013-07-26 ENCOUNTER — Encounter: Payer: Self-pay | Admitting: Internal Medicine

## 2013-07-26 ENCOUNTER — Ambulatory Visit (INDEPENDENT_AMBULATORY_CARE_PROVIDER_SITE_OTHER): Payer: Medicare Other | Admitting: Internal Medicine

## 2013-07-26 VITALS — BP 116/76 | HR 76 | Ht 72.0 in | Wt 207.5 lb

## 2013-07-26 DIAGNOSIS — R55 Syncope and collapse: Secondary | ICD-10-CM

## 2013-07-26 DIAGNOSIS — R42 Dizziness and giddiness: Secondary | ICD-10-CM

## 2013-07-26 NOTE — Progress Notes (Signed)
PCP:  Colton Bennett,Colton ROBERT, MD Cardiologist: Nahser  The patient presents today for routine electrophysiology followup.  Since last being seen in our clinic, the patient reports doing very well.  He has had no further frank syncope. He has had 2 pre-syncopal episodes but have not had any arrhythmias that correspond to the syncopal spells. He has had 3 2-2.5 second pauses on loop recorder but this does not correspond to any symptoms and appear to have occurred when alseep.    Today, he denies symptoms of palpitations, chest pain, shortness of breath, orthopnea, PND, lower extremity edema, dizziness, presyncope, syncope, or neurologic sequela.  The patient feels that he is tolerating medications without difficulties and is otherwise without complaint today.   Past Medical History  Diagnosis Date  . Syncope, near   . History of echocardiogram     Echocardiogram (01/2013): EF of 50-55%, normal wall motion, trivial AI, mild MR, mild to moderate LAE  . History of cardiovascular stress test     ETT-Myoview (06/07/13): No ischemia, EF 56%; normal study.  . DOE (dyspnea on exertion)   . Chest pain   . Dizziness   . OSA (obstructive sleep apnea)    Past Surgical History  Procedure Laterality Date  . Loop recorder implant  September 2014    Current Outpatient Prescriptions  Medication Sig Dispense Refill  . Multiple Vitamins-Minerals (MULTIVITAMIN PO) Take 1 tablet by mouth daily.      Marland Kitchen. omeprazole (PRILOSEC) 40 MG capsule Take 40 mg by mouth daily.       No current facility-administered medications for this visit.    No Known Allergies  History   Social History  . Marital Status: Married    Spouse Name: N/A    Number of Children: 1  . Years of Education: N/A   Occupational History  . 'retired    Social History Main Topics  . Smoking status: Former Smoker -- 3.00 packs/day for 30 years    Types: Cigarettes    Quit date: 02/01/1982  . Smokeless tobacco: Not on file  .  Alcohol Use: No  . Drug Use: No  . Sexual Activity: Not on file   Other Topics Concern  . Not on file   Social History Narrative  . No narrative on file    Family History  Problem Relation Age of Onset  . CAD Neg Hx     ROS-  All systems are reviewed and are negative except as outlined in the HPI above  Physical Exam: Filed Vitals:   07/26/13 1105  BP: 116/76  Pulse: 76  Height: 6' (1.829 m)  Weight: 207 lb 8 oz (94.121 kg)    GEN- The patient is well appearing, alert and oriented x 3 today.   Head- normocephalic, atraumatic Eyes-  Sclera clear, conjunctiva pink Ears- hearing intact Oropharynx- clear Neck- supple, no JVP Lymph- no cervical lymphadenopathy Lungs- Clear to ausculation bilaterally, normal work of breathing Heart- Regular rate and rhythm, no murmurs, rubs or gallops, PMI not laterally displaced GI- soft, NT, ND, + BS Extremities- no clubbing, cyanosis, or edema MS- no significant deformity or atrophy Skin- no rash or lesion Psych- euthymic mood, full affect Neuro- strength and sensation are intact  Assessment and Plan:  1. Syncope No arrhythmias to correlate with symptoms of dizziness on ILR. He has had several of asymptomatic nocturnal pauses only. We will continue to follow his loop recorder remotely He will follow-up with Dr Elease HashimotoNahser as scheduled.  He has occasional  blurred vision and dizziness which do not sound cardiac.  I have encouraged him to follow-up with primary care also.  EP to see as needed going forward.

## 2013-07-26 NOTE — Patient Instructions (Signed)
Your physician recommends that you schedule a follow-up appointment with Dr Johney FrameAllred as needed and keep scheduled appointment with Dr Elease HashimotoNahser

## 2013-08-14 ENCOUNTER — Ambulatory Visit (INDEPENDENT_AMBULATORY_CARE_PROVIDER_SITE_OTHER): Payer: Medicare Other | Admitting: *Deleted

## 2013-08-14 DIAGNOSIS — R55 Syncope and collapse: Secondary | ICD-10-CM

## 2013-08-14 LAB — MDC_IDC_ENUM_SESS_TYPE_REMOTE

## 2013-09-13 ENCOUNTER — Ambulatory Visit (INDEPENDENT_AMBULATORY_CARE_PROVIDER_SITE_OTHER): Payer: Medicare Other | Admitting: *Deleted

## 2013-09-13 DIAGNOSIS — R55 Syncope and collapse: Secondary | ICD-10-CM

## 2013-09-13 LAB — MDC_IDC_ENUM_SESS_TYPE_REMOTE

## 2013-09-27 ENCOUNTER — Ambulatory Visit (INDEPENDENT_AMBULATORY_CARE_PROVIDER_SITE_OTHER): Payer: Medicare Other | Admitting: Cardiovascular Disease

## 2013-09-27 ENCOUNTER — Encounter: Payer: Self-pay | Admitting: Cardiovascular Disease

## 2013-09-27 VITALS — BP 126/68 | HR 60 | Ht 72.0 in | Wt 203.1 lb

## 2013-09-27 DIAGNOSIS — R0789 Other chest pain: Secondary | ICD-10-CM

## 2013-09-27 DIAGNOSIS — R55 Syncope and collapse: Secondary | ICD-10-CM

## 2013-09-27 DIAGNOSIS — R072 Precordial pain: Secondary | ICD-10-CM

## 2013-09-27 NOTE — Patient Instructions (Signed)
Your physician recommends that you continue on your current medications as directed. Please refer to the Current Medication list given to you today.  Your physician wants you to follow-up in: 6 months. You will receive a reminder letter in the mail two months in advance. If you don't receive a letter, please call our office to schedule the follow-up appointment.  

## 2013-09-27 NOTE — Assessment & Plan Note (Signed)
He had an episode of syncope last July. He's had an implantable loop recorder in. One 3 second pause but he was asymptomatic during this pause.  I discussed the case with Dr. Ladona Ridgelaylor. At present, there is no indication for pacemaker. We will continue to observe.

## 2013-09-27 NOTE — Assessment & Plan Note (Signed)
He's not had any episodes of chest discomfort.  He had a normal Myoview study in November, 2014.

## 2013-09-27 NOTE — Progress Notes (Signed)
Colton CriglerAlton Bennett Bennett Date of Birth  10/31/1933       [facility]    [place] [facility] [address], [address]  [address] [place], [place]  [id number]   [place], [place]  [id number] [phone]     [phone]   Fax  [phone]    Fax [phone]  Problem List: 1. TIA 2. Dyspnea   History of Present Illness:  Colton Bennett is a 80 yo with a recent episode of collapse.  He was seen with his wife who provided most of the accurate history - he seems to play his symptoms down quite a bit.    He got out of his truck and collapsed in the parking lot.  He was talking and remained conscious the whole time.   He had severe nausea and vomitting after that.   He remained unsteady and was not able to walk for several hours.  He did not want to go to the doctor or ER.    He felt better after several days.  He were 30 day event monitor. He was found to have normal sinus rhythm. He had occasional episodes of frequent premature ventricular contractions-occasionally in a bigeminal pattern.  He did not have any symptoms while wearing the monitor.  He had an echocardiogram which revealed normal left ventricle systolic function with an ejection fraction of 50-55%. He had normal wall motion. He had trivial aortic insufficiency, mild mitral regurgitation. The left atrium was mildly to moderately dilated.  His EEG was normal.  He's had 2 different sleep studies. The first study was July 31 which revealed severe sleep apnea during REM sleep. He had a second sleep study the results of which are not known at this time.  His MRI was normal. The head MRA is normal.  September 27, 2013:  Colton Bennett has had an implantable loop recorder placed.   He's had a few episodes of presyncope.  He exercises on occasion. He denies any chest pain or shortness breath.  He was found to have a 3 second pause with his ILR.    Current Outpatient Prescriptions on File Prior to Visit  Medication Sig Dispense  Refill  . Multiple Vitamins-Minerals (MULTIVITAMIN PO) Take 1 tablet by mouth daily.      Marland Kitchen. omeprazole (PRILOSEC) 40 MG capsule Take 40 mg by mouth daily.       No current facility-administered medications on file prior to visit.    No Known Allergies  Past Medical History  Diagnosis Date  . Syncope, near   . History of echocardiogram     Echocardiogram (01/2013): EF of 50-55%, normal wall motion, trivial AI, mild MR, mild to moderate LAE  . History of cardiovascular stress test     ETT-Myoview (06/07/13): No ischemia, EF 56%; normal study.  . DOE (dyspnea on exertion)   . Chest pain   . Dizziness   . OSA (obstructive sleep apnea)     Past Surgical History  Procedure Laterality Date  . Loop recorder implant  September 2014    History  Smoking status  . Former Smoker -- 3.00 packs/day for 30 years  . Types: Cigarettes  . Quit date: 02/01/1982  Smokeless tobacco  . Not on file    History  Alcohol Use No    Family History  Problem Relation Age of Onset  . CAD Neg Hx     Reviw of Systems:  Reviewed in the HPI.  All other systems  are negative.  Physical Exam: Blood pressure 126/68, pulse 60, height 6' (1.829 m), weight 203 lb 1.9 oz (92.135 kg). General: Well developed, well nourished, in no acute distress.  Head: Normocephalic, atraumatic, sclera non-icteric, mucus membranes are moist,   Neck: Supple. Carotids are 2 + without bruits. No JVD   Lungs: Clear   Heart: RR, normal S1, S2, occasional PVCs  Abdomen: Soft, non-tender, non-distended with normal bowel sounds.  Msk:  Strength and tone are normal   Extremities: No clubbing or cyanosis. No edema.  Distal pedal pulses are 2+ and equal    Neuro: CN II - XII intact.  Alert and oriented X 3.   Psych:  Normal   ECG: Sept. 22, 2014:  NSR at 75 with occasional PVCs otherwise normal ECG.  Assessment / Plan:

## 2013-10-13 ENCOUNTER — Ambulatory Visit (INDEPENDENT_AMBULATORY_CARE_PROVIDER_SITE_OTHER): Payer: Medicare Other | Admitting: *Deleted

## 2013-10-13 DIAGNOSIS — R55 Syncope and collapse: Secondary | ICD-10-CM

## 2013-10-18 ENCOUNTER — Encounter: Payer: Self-pay | Admitting: Internal Medicine

## 2013-10-30 ENCOUNTER — Telehealth: Payer: Self-pay | Admitting: Cardiovascular Disease

## 2013-10-30 NOTE — Telephone Encounter (Signed)
Follow up     Pt would like to make sure that the office received his transmition form 10/13/13

## 2013-10-30 NOTE — Telephone Encounter (Signed)
Transmission received, patient aware. 

## 2013-11-01 ENCOUNTER — Encounter: Payer: Self-pay | Admitting: Internal Medicine

## 2013-11-14 ENCOUNTER — Encounter: Payer: Self-pay | Admitting: Internal Medicine

## 2013-11-14 ENCOUNTER — Ambulatory Visit (INDEPENDENT_AMBULATORY_CARE_PROVIDER_SITE_OTHER): Payer: Medicare Other | Admitting: *Deleted

## 2013-11-14 DIAGNOSIS — R55 Syncope and collapse: Secondary | ICD-10-CM

## 2013-11-14 LAB — MDC_IDC_ENUM_SESS_TYPE_REMOTE

## 2013-11-30 ENCOUNTER — Encounter: Payer: Self-pay | Admitting: Internal Medicine

## 2013-12-04 LAB — MDC_IDC_ENUM_SESS_TYPE_REMOTE: MDC IDC SESS DTM: 20150314040500

## 2013-12-12 NOTE — Progress Notes (Signed)
Loop recorder 

## 2013-12-14 ENCOUNTER — Ambulatory Visit (INDEPENDENT_AMBULATORY_CARE_PROVIDER_SITE_OTHER): Payer: Medicare Other | Admitting: *Deleted

## 2013-12-14 DIAGNOSIS — R55 Syncope and collapse: Secondary | ICD-10-CM

## 2013-12-14 LAB — MDC_IDC_ENUM_SESS_TYPE_REMOTE: MDC IDC SESS DTM: 20150518040500

## 2013-12-27 ENCOUNTER — Encounter: Payer: Self-pay | Admitting: Internal Medicine

## 2014-01-15 ENCOUNTER — Ambulatory Visit (INDEPENDENT_AMBULATORY_CARE_PROVIDER_SITE_OTHER): Payer: Medicare Other | Admitting: Neurology

## 2014-01-15 ENCOUNTER — Encounter: Payer: Self-pay | Admitting: Neurology

## 2014-01-15 VITALS — BP 114/65 | HR 63 | Ht 72.0 in | Wt 201.0 lb

## 2014-01-15 DIAGNOSIS — R42 Dizziness and giddiness: Secondary | ICD-10-CM

## 2014-01-15 DIAGNOSIS — Z9989 Dependence on other enabling machines and devices: Principal | ICD-10-CM

## 2014-01-15 DIAGNOSIS — G4733 Obstructive sleep apnea (adult) (pediatric): Secondary | ICD-10-CM

## 2014-01-15 DIAGNOSIS — R55 Syncope and collapse: Secondary | ICD-10-CM

## 2014-01-15 NOTE — Progress Notes (Signed)
Subjective:    Patient ID: Colton Bennett is a 80 y.o. male.  HPI    Interim history:   Colton Bennett is a very pleasant 80 year old right-handed woman with an underlying medical history of reflux disease, syncope and benign prostate hypertrophy who presents for followup consultation of his OSA. He is accompanied by his GS today. I last saw him on 07/17/2013, at which time he reported nausea and an upset stomach (after eating a cheeseburger and fries from [facility] the night prior). Her reported mild, intermittent dizziness, no vertigo. I also reviewed his compliance data from 06/17/2013 through 07/16/2013 which is a total of 30 days during which time he was 100% compliant with treatment with a percent used days greater than 4 hours of 100%, indicating excellent compliance. His residual AHI was 2.5 per hour indicating an appropriate treatment pressure of 8 cm with EPR of 2. His average usage was 7 hours and 59 minutes.   Today, I reviewed his compliance data from 11/24/2013 through 12/23/2013 which is a total of 30 days during which time he used his CPAP every night, percent used days greater than 4 hours was 93%, indicating excellent compliance, average usage was 6 hours and 35 minutes, residual AHI low at 1.3 per hour and leak acceptable at 19.1 L per minute for the 95th percentile, pressure at 8 cm with EPR of 2.  Today, he reports he still uses CPAP faithfully. He has no problems with using it, but never really had tell-tale symptoms. He has no new complaints today. He needs new supplies. He likes to drink [facility]. He still has a loop recorder in place. He denies any recent palpitations, dizziness, chest pain, shortness of breath, or lightheadedness.  I saw him on 05/18/2013 for followup of his syncope and collapse as well as after his sleep study. At the time, we discussed all his test results including blood work (ANA, CRP, CBC, CK, hemoglobin A1c, CMP, serum electrophoresis, RF, RPR, ESR, TSH),  echocardiogram, EEG, cardiac event monitor, and MRA neck and head and MRI of brain as well as his sleep study. His labs were essentially unremarkable, with the exception of his hemoglobin A1c at 5.8. His TSH was normal. His MRI brain showed white matter changes, and repeat MRI brain with contrast showed no abnormal contrast enhancement. His MRA neck and head were unremarkable. His EEG was unremarkable. He had a baseline sleep study on 02/08/2013 which demonstrated a total AHI of 12 per hour, a severe REM related OSA with a REM-related AHI of 48 per hour and an oxyhemoglobin desaturation nadir of 83%. Based on those results I asked him to return for CPAP titration study which he had on 03/21/2013, his CPAP was titrated from 5-9 cm with almost complete resolution of his sleep disordered breathing on a pressure of 8 cm. He was placed on CPAP at a pressure of 8 cm with large nasal pillows.   I reviewed his compliance data from 04/17/2013 through 05/17/2013 which is a total of 31 days during which time he used CPAP every day. His percent used days greater than 4 hours was 100% indicating excellent compliance. His average usage was 7 hours and 24 minutes. His leak was not very high. His residual AHI was 2.2 per hour indicating an appropriate treatment pressure of 8 cm of water with EPR of 2.  He had a 30 day Holter monitor which showed no sustained irregularities but some PVCs. Since then, in late September he had a  implantable loop recorder placed.  Has been experiencing periods of dizziness and blurry vision as well as nausea and vomiting for the past several years, probably at least 2 years. On 01/12/13 he had parked his car in a parking lot and was on his way to the store and collapsed. He had no warning signs, no palpitations, and claims he had no LOC, no HA, SOB. Some 2 years ago, he had an episode of L facial weakness. No hx of staring spells, no twitching, no shaking, no automatism, except occasional lip  smacking while driving or while eating.  He presented to the emergency room with midsternal chest pain on 06/01/2013 and was admitted for one day with negative cardiac workup. He was scheduled for an outpatient stress test, which he had on 06/07/2013 and it was negative. He has been seen by cardiology outpatient and needs no further w/u from their end at this time and had a loop recorder placed.   Her Past Medical History Is Significant For: Past Medical History  Diagnosis Date  . Syncope, near   . History of echocardiogram     Echocardiogram (01/2013): EF of 50-55%, normal wall motion, trivial AI, mild MR, mild to moderate LAE  . History of cardiovascular stress test     ETT-Myoview (06/07/13): No ischemia, EF 56%; normal study.  . DOE (dyspnea on exertion)   . Chest pain   . Dizziness   . OSA (obstructive sleep apnea)     Her Past Surgical History Is Significant For: Past Surgical History  Procedure Laterality Date  . Loop recorder implant  September 2014    Her Family History Is Significant For: Family History  Problem Relation Age of Onset  . CAD Neg Hx     Her Social History Is Significant For: History   Social History  . Marital Status: Married    Spouse Name: N/A    Number of Children: 1  . Years of Education: N/A   Occupational History  . 'retired    Social History Main Topics  . Smoking status: Former Smoker -- 3.00 packs/day for 30 years    Types: Cigarettes    Quit date: 02/01/1982  . Smokeless tobacco: None  . Alcohol Use: No  . Drug Use: No  . Sexual Activity: None   Other Topics Concern  . None   Social History Narrative  . None    Her Allergies Are:  No Known Allergies:   Her Current Medications Are:  Outpatient Encounter Prescriptions as of 01/15/2014  Medication Sig  . Multiple Vitamins-Minerals (MULTIVITAMIN PO) Take 1 tablet by mouth daily.  Marland Kitchen omeprazole (PRILOSEC) 40 MG capsule Take 40 mg by mouth daily.  :  Review of Systems:  Out  of a complete 14 point review of systems, all are reviewed and negative with the exception of these symptoms as listed below:  Review of Systems  Constitutional: Negative.   HENT: Positive for hearing loss.   Eyes: Negative.   Respiratory: Negative.   Cardiovascular: Negative.   Gastrointestinal: Negative.   Endocrine: Negative.   Genitourinary: Negative.   Musculoskeletal: Negative.   Skin: Negative.   Allergic/Immunologic: Negative.   Neurological: Negative.   Hematological: Negative.   Psychiatric/Behavioral: Negative.     Objective:  Neurologic Exam  Physical Exam Physical Examination:   Filed Vitals:   01/15/14 1144  BP: 114/65  Pulse: 63    General Examination: The patient is a very pleasant 80 y.o. male in no acute distress. He  appears well-developed and well-nourished and well groomed.   HEENT: Normocephalic, atraumatic, pupils are equal, round and reactive to light and accommodation. Funduscopic exam is normal. Extraocular tracking is good without limitation to gaze excursion or nystagmus noted. Normal smooth pursuit is noted. Hearing is grossly intact. Face is slight asymmetric, unchanged. Speech is clear with no dysarthria noted. There is no hypophonia. There is no lip, neck/head, jaw or voice tremor. Neck is supple with full range of passive and active motion. There are no carotid bruits on auscultation. Oropharynx exam reveals: mild mouth dryness, adequate dental hygiene and moderate airway crowding, due to elongated uvula and redundant soft palate. Mallampati is class II. Tongue protrudes centrally and palate elevates symmetrically.   Chest: Clear to auscultation without wheezing, rhonchi or crackles noted.  Heart: S1+S2+0, regular and normal without murmurs, rubs or gallops noted. He does seem to have an occasional extra heartbeat with a pause.  Abdomen: Soft, non-tender and non-distended with normal bowel sounds appreciated on auscultation.  Extremities: There  is no pitting edema in the distal lower extremities bilaterally. Pedal pulses are intact.  Skin: Warm and dry without trophic changes noted. There are no varicose veins. He has vitiligo. He has a small erythematous spot in the pre-tibial area on the R leg. He states he recently had a tick bite. No rashes noted otherwise. No joint swelling.  Musculoskeletal: exam reveals no obvious joint deformities, tenderness or joint swelling or erythema.   Neurologically:  Mental status: The patient is awake, alert and oriented in all 4 spheres. His memory, attention, language and knowledge are appropriate. There is no aphasia, agnosia, apraxia or anomia. Speech is clear with normal prosody and enunciation. Thought process is linear. Mood is congruent and affect is normal.  Cranial nerves are as described above under HEENT exam. In addition, shoulder shrug is normal with equal shoulder height noted. Motor exam: Normal bulk, strength and tone is noted. There is no drift, tremor or rebound. Romberg is negative, but there was mild sway. Reflexes are 2+ throughout. Fine motor skills are intact. Cerebellar testing shows no dysmetria or intention tremor on finger to nose testing. There is no truncal or gait ataxia. Sensory exam is intact to light touch.  Gait, station and balance: he stands without difficulty. No veering to one side is noted. No leaning to one side is noted. No dizziness reported. Posture is age-appropriate and stance is narrow based. No problems turning are noted. He turns en bloc.   Assessment and Plan:   In summary, Colton Bennett is a very pleasant 80 year old male with and history of dizziness, and syncope last year who presents for followup. As far as his neurological workup, he had brain imaging studies, EEG, and sleep studies. He had a Holter monitor and still has an implanted loop recorder. There is no obvious etiology of his syncopal events. His sleep study confirmed obstructive sleep apnea which  was severe in REM sleep. He has been on CPAP and has been completely compliant with it. While he is questioning whether he needs to use CPAP every night, he has been using it faithfully. I encouraged him to continue using CPAP as it helped his sleep and improved his oxygen saturations and eliminated his sleep apnea. I revisited his sleep study results and explained his compliance data to him today. I am very pleased with his compliance and congratulated him on being very adherent to therapy. He is willing to continue with treatment.    I answered  all their questions today and the patient was in agreement. I would like to see the patient back in one year, as he is doing well, and encouraged them to call with any interim questions, concerns, problems or updates.

## 2014-01-15 NOTE — Patient Instructions (Signed)
Keep up the good work! I will see you back in 12 months for sleep apnea check up. We will order new supplies.

## 2014-01-16 ENCOUNTER — Ambulatory Visit (INDEPENDENT_AMBULATORY_CARE_PROVIDER_SITE_OTHER): Payer: Medicare Other | Admitting: *Deleted

## 2014-01-16 DIAGNOSIS — R55 Syncope and collapse: Secondary | ICD-10-CM

## 2014-01-16 LAB — MDC_IDC_ENUM_SESS_TYPE_REMOTE

## 2014-01-26 ENCOUNTER — Encounter: Payer: Self-pay | Admitting: Internal Medicine

## 2014-01-29 NOTE — Progress Notes (Signed)
Loop recorder 

## 2014-02-15 ENCOUNTER — Ambulatory Visit (INDEPENDENT_AMBULATORY_CARE_PROVIDER_SITE_OTHER): Payer: Medicare Other | Admitting: *Deleted

## 2014-02-15 DIAGNOSIS — R55 Syncope and collapse: Secondary | ICD-10-CM

## 2014-02-19 NOTE — Progress Notes (Signed)
Loop recorder 

## 2014-03-16 ENCOUNTER — Ambulatory Visit (INDEPENDENT_AMBULATORY_CARE_PROVIDER_SITE_OTHER): Payer: Medicare Other | Admitting: *Deleted

## 2014-03-16 DIAGNOSIS — R55 Syncope and collapse: Secondary | ICD-10-CM

## 2014-03-23 NOTE — Progress Notes (Signed)
Loop recorder 

## 2014-03-26 LAB — MDC_IDC_ENUM_SESS_TYPE_REMOTE

## 2014-03-27 LAB — MDC_IDC_ENUM_SESS_TYPE_REMOTE

## 2014-03-28 ENCOUNTER — Encounter: Payer: Self-pay | Admitting: Cardiovascular Disease

## 2014-03-28 ENCOUNTER — Ambulatory Visit (INDEPENDENT_AMBULATORY_CARE_PROVIDER_SITE_OTHER): Payer: Medicare Other | Admitting: Cardiovascular Disease

## 2014-03-28 ENCOUNTER — Encounter: Payer: Self-pay | Admitting: Internal Medicine

## 2014-03-28 VITALS — BP 112/68 | HR 72 | Ht 72.0 in | Wt 207.4 lb

## 2014-03-28 DIAGNOSIS — R55 Syncope and collapse: Secondary | ICD-10-CM

## 2014-03-28 NOTE — Assessment & Plan Note (Signed)
Colton Bennett is doing well.  He has not had any further episodes of syncope.  He still has his implantable loop.  Will see him in 1 year.

## 2014-03-28 NOTE — Patient Instructions (Signed)
Your physician recommends that you continue on your current medications as directed. Please refer to the Current Medication list given to you today.  Your physician wants you to follow-up in: 1 year with Dr. Nahser.  You will receive a reminder letter in the mail two months in advance. If you don't receive a letter, please call our office to schedule the follow-up appointment.  

## 2014-03-28 NOTE — Progress Notes (Signed)
Colton Bennett Date of Birth  [birth year]       [facility]    [place] [address], [address]  [address], [address] [place], [place]  [address]   [place], [place]  [phone]     [phone]   Fax  [phone]    Fax [phone]  Problem List: 1. TIA 2. Dyspnea   History of Present Illness:  Colton Bennett is a 80 yo with a recent episode of collapse.  He was seen with his wife who provided most of the accurate history - he seems to play his symptoms down quite a bit.    He got out of his truck and collapsed in the parking lot.  He was talking and remained conscious the whole time.   He had severe nausea and vomitting after that.   He remained unsteady and was not able to walk for several hours.  He did not want to go to the doctor or ER.    He felt better after several days.  He were 30 day event monitor. He was found to have normal sinus rhythm. He had occasional episodes of frequent premature ventricular contractions-occasionally in a bigeminal pattern.  He did not have any symptoms while wearing the monitor.  He had an echocardiogram which revealed normal left ventricle systolic function with an ejection fraction of 50-55%. He had normal wall motion. He had trivial aortic insufficiency, mild mitral regurgitation. The left atrium was mildly to moderately dilated.  His EEG was normal.  He's had 2 different sleep studies. The first study was July 31 which revealed severe sleep apnea during REM sleep. He had a second sleep study the results of which are not known at this time.  His MRI was normal. The head MRA is normal.  September 27, 2013:  Colton Bennett has had an implantable loop recorder placed.   He's had a few episodes of presyncope.  He exercises on occasion. He denies any chest pain or shortness breath.  He was found to have a 3 second pause with his ILR.    Sept. 16,2015:  Colton Bennett has done well.  No further syncopal episodes.   Still has  the implantable loop.    Has been busy working in his garden this summer.    Current Outpatient Prescriptions on File Prior to Visit  Medication Sig Dispense Refill  . Multiple Vitamins-Minerals (MULTIVITAMIN PO) Take 1 tablet by mouth daily.      Marland Kitchen omeprazole (PRILOSEC) 40 MG capsule Take 40 mg by mouth daily.       No current facility-administered medications on file prior to visit.    No Known Allergies  Past Medical History  Diagnosis Date  . Syncope, near   . History of echocardiogram     Echocardiogram (01/2013): EF of 50-55%, normal wall motion, trivial AI, mild MR, mild to moderate LAE  . History of cardiovascular stress test     ETT-Myoview (06/07/13): No ischemia, EF 56%; normal study.  . DOE (dyspnea on exertion)   . Chest pain   . Dizziness   . OSA (obstructive sleep apnea)     Past Surgical History  Procedure Laterality Date  . Loop recorder implant  September 2014    History  Smoking status  . Former Smoker -- 3.00 packs/day for 30 years  . Types: Cigarettes  . Quit date: 02/01/1982  Smokeless tobacco  . Not on file    History  Alcohol  Use No    Family History  Problem Relation Age of Onset  . CAD Neg Hx     Reviw of Systems:  Reviewed in the HPI.  All other systems are negative.  Physical Exam: Blood pressure 112/68, pulse 72, height 6' (1.829 m), weight 207 lb 6.4 oz (94.076 kg). General: Well developed, well nourished, in no acute distress.  Head: Normocephalic, atraumatic, sclera non-icteric, mucus membranes are moist,   Neck: Supple. Carotids are 2 + without bruits. No JVD   Lungs: Clear   Heart: RR, normal S1, S2, occasional PVCs  Abdomen: Soft, non-tender, non-distended with normal bowel sounds.  Msk:  Strength and tone are normal   Extremities: No clubbing or cyanosis. No edema.  Distal pedal pulses are 2+ and equal    Neuro: CN II - XII intact.  Alert and oriented X 3.   Psych:  Normal   ECG: Sept. 22, 2014:  NSR at 75  with occasional PVCs otherwise normal ECG.  Assessment / Plan:

## 2014-04-04 ENCOUNTER — Encounter: Payer: Self-pay | Admitting: Internal Medicine

## 2014-04-04 NOTE — Sleep Study (Signed)
See media tab for full report  

## 2014-04-16 ENCOUNTER — Ambulatory Visit (INDEPENDENT_AMBULATORY_CARE_PROVIDER_SITE_OTHER): Payer: Medicare Other | Admitting: *Deleted

## 2014-04-16 DIAGNOSIS — R55 Syncope and collapse: Secondary | ICD-10-CM

## 2014-04-20 NOTE — Progress Notes (Signed)
Loop recorder 

## 2014-04-27 LAB — MDC_IDC_ENUM_SESS_TYPE_REMOTE

## 2014-05-01 ENCOUNTER — Encounter: Payer: Self-pay | Admitting: Internal Medicine

## 2014-05-31 ENCOUNTER — Encounter: Payer: Self-pay | Admitting: Internal Medicine

## 2014-06-11 ENCOUNTER — Ambulatory Visit (INDEPENDENT_AMBULATORY_CARE_PROVIDER_SITE_OTHER): Payer: Medicare Other | Admitting: *Deleted

## 2014-06-11 DIAGNOSIS — R55 Syncope and collapse: Secondary | ICD-10-CM

## 2014-06-11 LAB — MDC_IDC_ENUM_SESS_TYPE_REMOTE: MDC IDC SESS DTM: 20151106050500

## 2014-06-20 NOTE — Progress Notes (Signed)
Loop recorder 

## 2014-06-21 ENCOUNTER — Encounter (HOSPITAL_COMMUNITY): Payer: Self-pay | Admitting: Internal Medicine

## 2014-07-04 ENCOUNTER — Encounter: Payer: Self-pay | Admitting: Internal Medicine

## 2014-07-07 IMAGING — CR DG CHEST 2V
2 series · 2 of 2 positions shown · non-contrast
Comparison: None.

CLINICAL DATA: Chest pain.

EXAM:
CHEST  2 VIEW

[w chest pa]
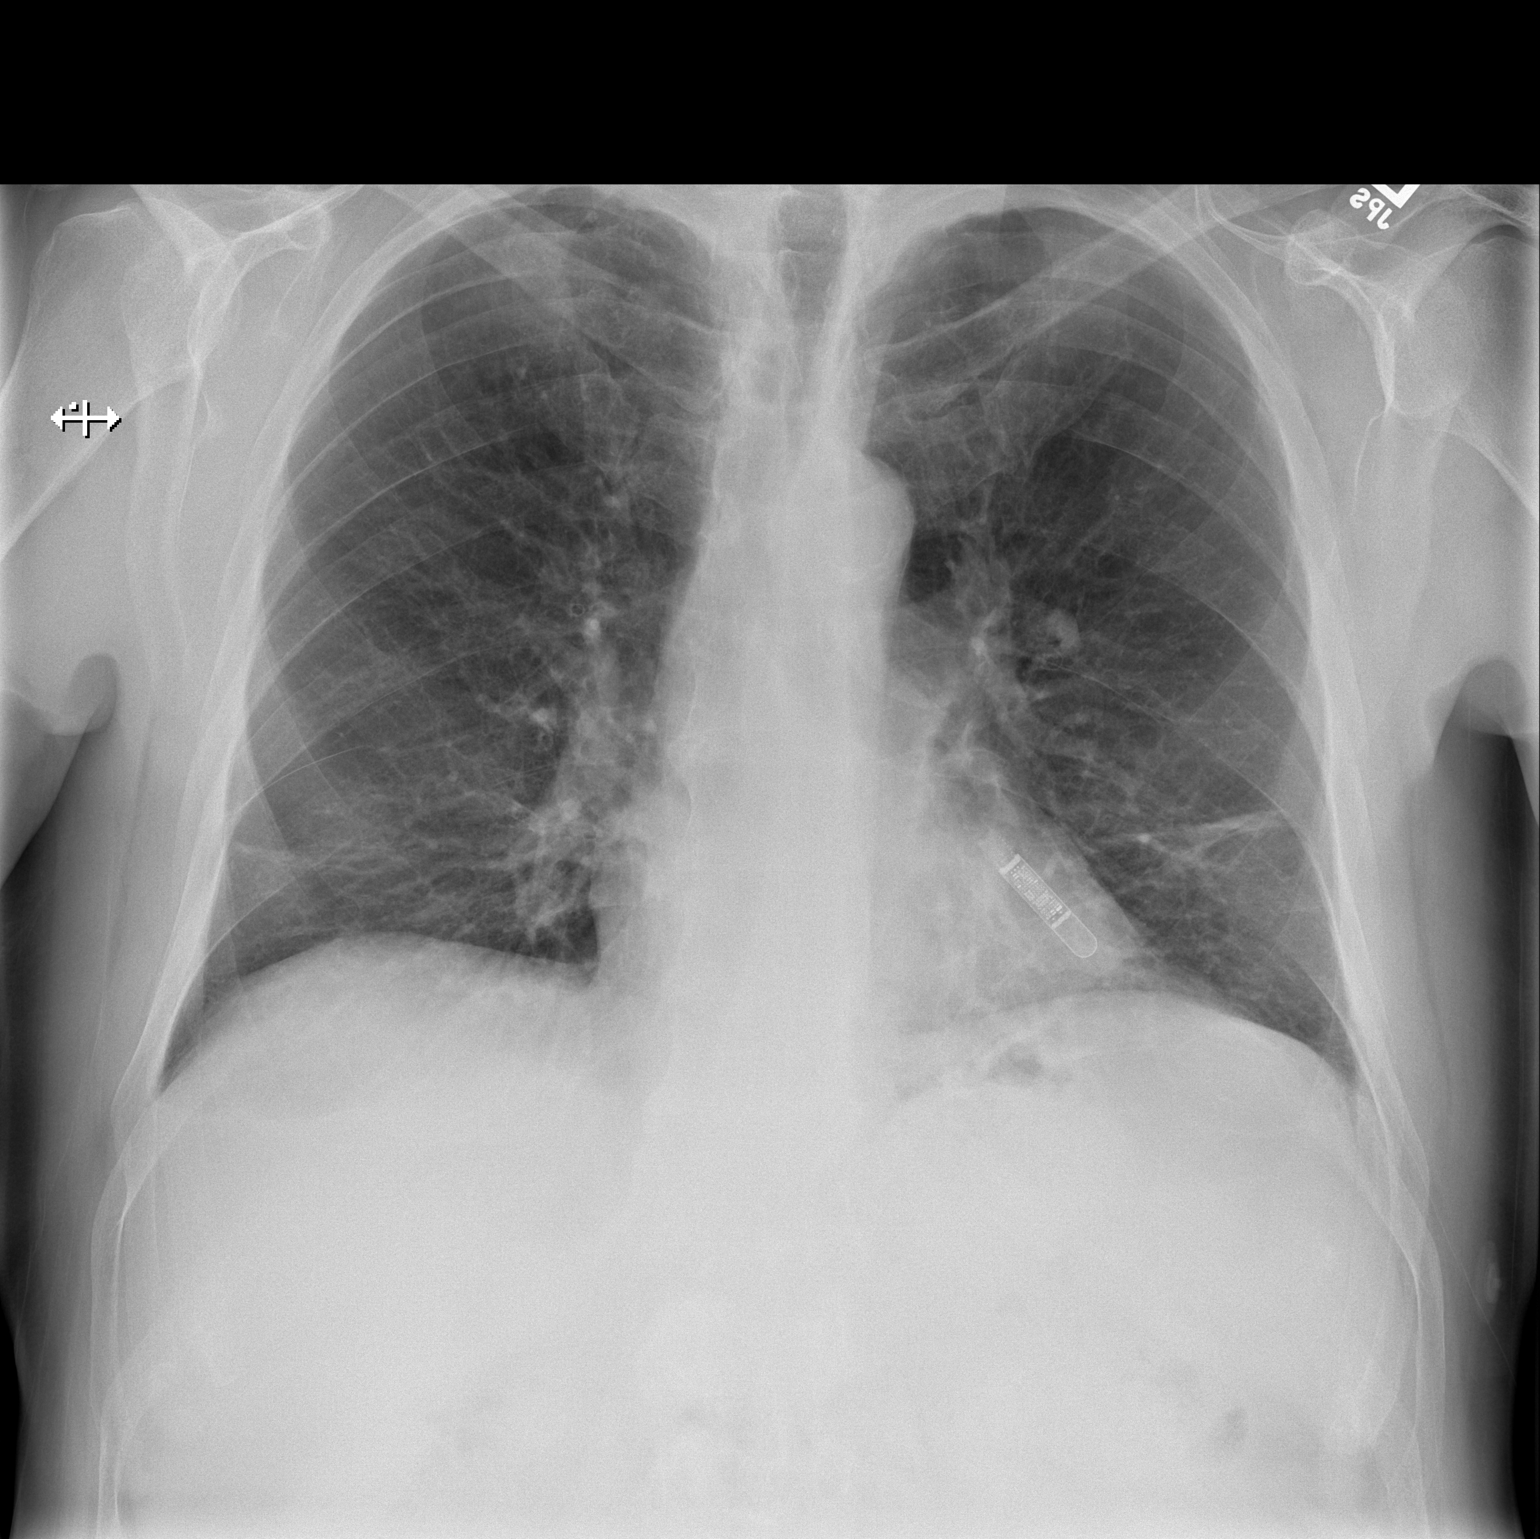

[w chest lat]
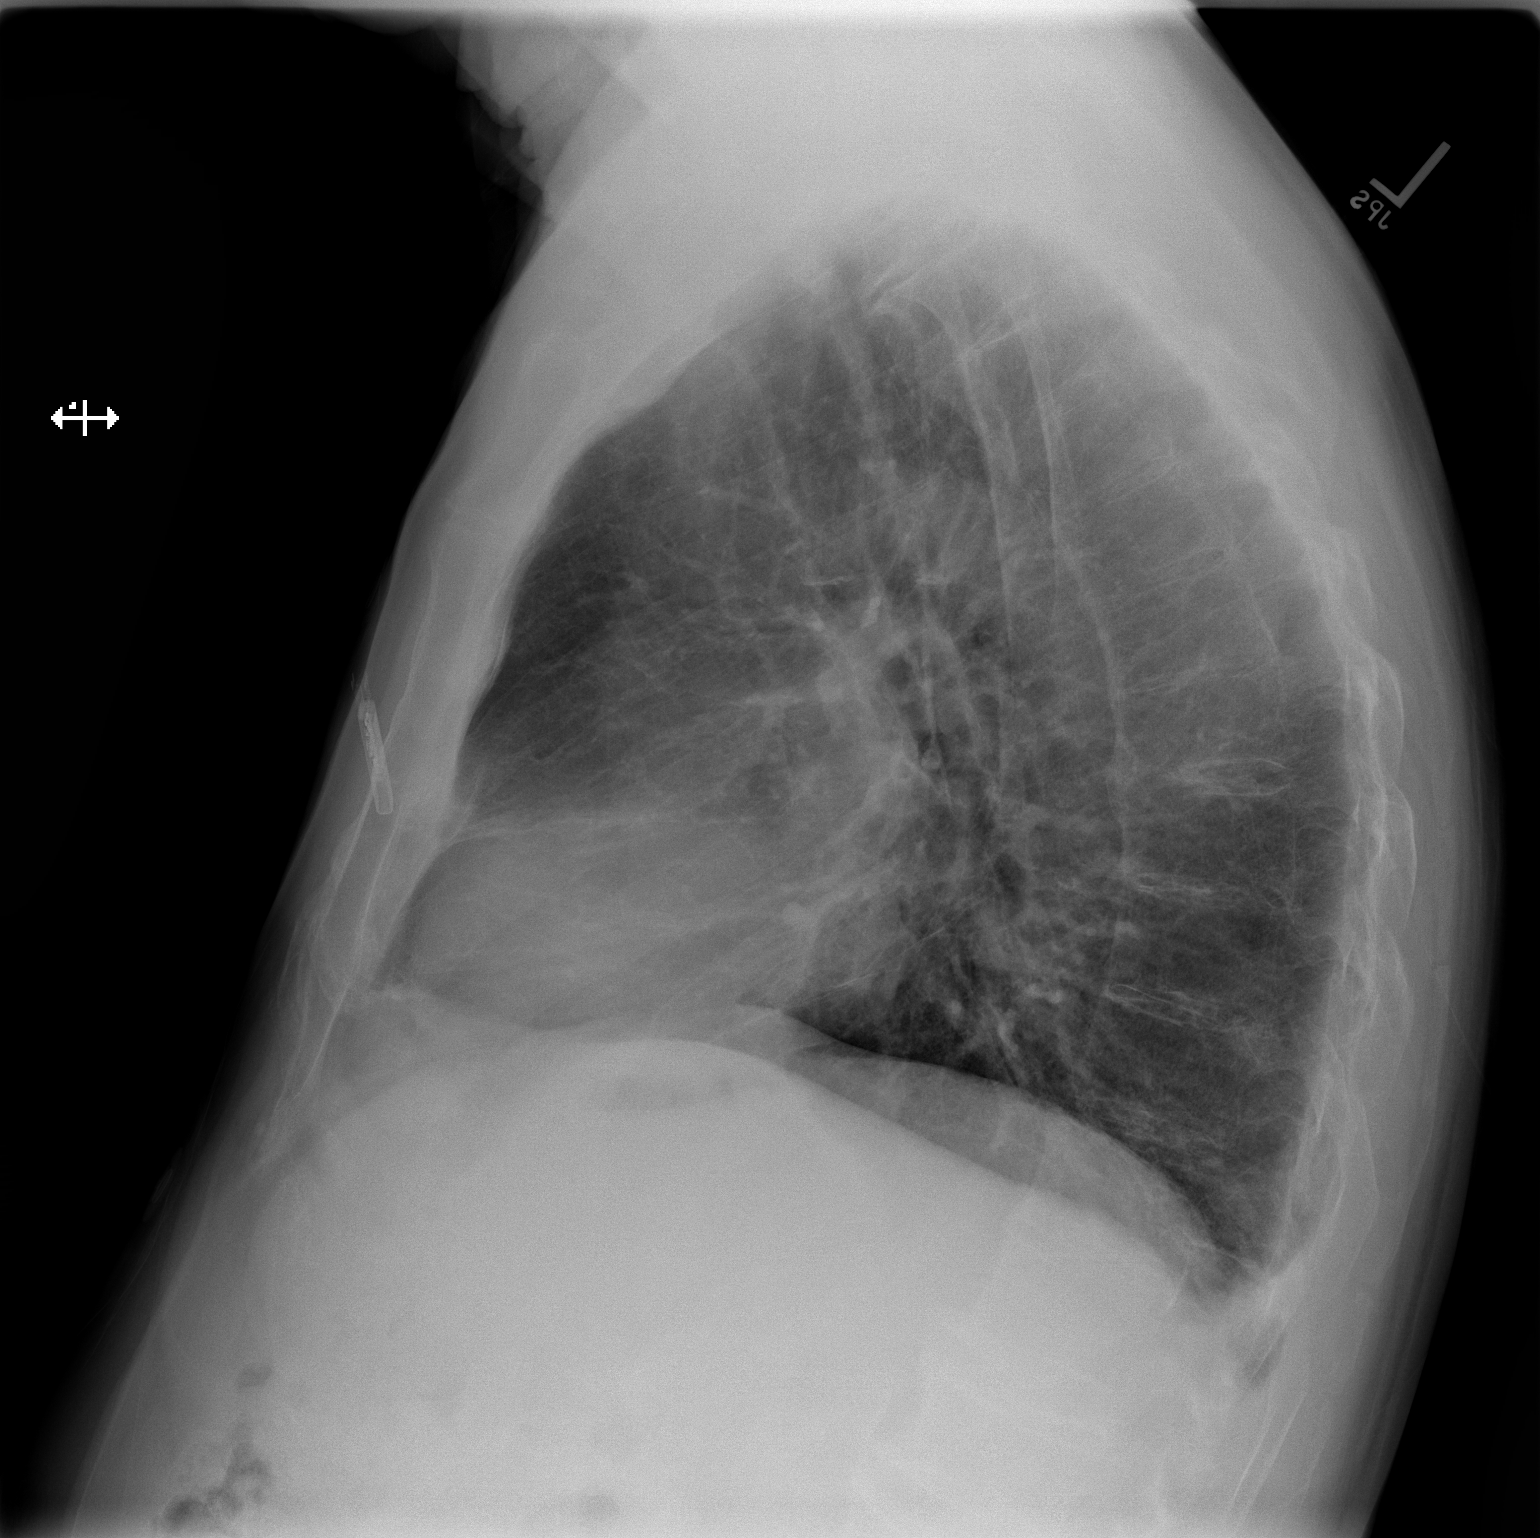

[2 of 2 positions shown; findings below may reference images not displayed]

FINDINGS: Loop recorder is noted. There is some linear atelectasis or scarring
in the lung bases. The lungs are otherwise clear with some apical
scarring noted. No pneumothorax or pleural fluid. Heart size is
normal.
IMPRESSION: No active cardiopulmonary disease. Mild bibasilar atelectasis or
scar.

## 2014-07-16 ENCOUNTER — Ambulatory Visit (INDEPENDENT_AMBULATORY_CARE_PROVIDER_SITE_OTHER): Payer: Medicare Other | Admitting: *Deleted

## 2014-07-16 DIAGNOSIS — R55 Syncope and collapse: Secondary | ICD-10-CM

## 2014-07-20 NOTE — Progress Notes (Signed)
Loop recorder 

## 2014-07-23 ENCOUNTER — Encounter: Payer: Medicare Other | Admitting: Internal Medicine

## 2014-07-23 ENCOUNTER — Encounter: Payer: Self-pay | Admitting: Internal Medicine

## 2014-07-30 LAB — MDC_IDC_ENUM_SESS_TYPE_REMOTE: Date Time Interrogation Session: 20160107050500

## 2014-08-07 ENCOUNTER — Encounter: Payer: Self-pay | Admitting: Internal Medicine

## 2014-08-14 ENCOUNTER — Ambulatory Visit (INDEPENDENT_AMBULATORY_CARE_PROVIDER_SITE_OTHER): Payer: Medicare Other | Admitting: *Deleted

## 2014-08-14 DIAGNOSIS — R55 Syncope and collapse: Secondary | ICD-10-CM

## 2014-08-16 ENCOUNTER — Encounter: Payer: Self-pay | Admitting: Internal Medicine

## 2014-08-17 NOTE — Progress Notes (Signed)
Loop recorder 

## 2014-08-20 ENCOUNTER — Encounter: Payer: Self-pay | Admitting: Internal Medicine

## 2014-08-20 ENCOUNTER — Ambulatory Visit (INDEPENDENT_AMBULATORY_CARE_PROVIDER_SITE_OTHER): Payer: Medicare Other | Admitting: Internal Medicine

## 2014-08-20 VITALS — BP 126/76 | HR 80 | Ht 72.0 in | Wt 209.6 lb

## 2014-08-20 DIAGNOSIS — G4733 Obstructive sleep apnea (adult) (pediatric): Secondary | ICD-10-CM

## 2014-08-20 DIAGNOSIS — R55 Syncope and collapse: Secondary | ICD-10-CM

## 2014-08-20 NOTE — Patient Instructions (Signed)
Your physician wants you to follow-up in: 12 months with Dr Jacquiline DoeAllred You will receive a reminder letter in the mail two months in advance. If you don't receive a letter, please call our office to schedule the follow-up appointment.   Remote monitoring is used to monitor your Pacemaker of ICD from home. This monitoring reduces the number of office visits required to check your device to one time per year. It allows us to keep an eye on the functioning of your device to ensure it is working properly. You are scheduled for a device check from home on 11/19/14. You may send your transmission at any time that day. If you have a wireless device, the transmission will be sent automatically. After your physician reviews your transmission, you will receive a postcard with your next transmission date.

## 2014-08-20 NOTE — Progress Notes (Signed)
PCP: NYLAND,LEONARD ROBERT, MD Primary Cardiologist:  Dr Elease HashimotoNahser PrimaJosue Hectorry EP: Kynnedy Carreno  Renne Criglerlton F Plamondon is a 81 y.o. male who presents today for routine electrophysiology followup.  Since last being seen in our clinic, the patient reports doing very well.  He has had no further syncope but has had several sinus pauses as well as post PVC pauses observed.  Many of these are nocturnal.  He wears his CPAP.  He follows with neurology and has downloads from [facility].  He does occasionally take his CPAP off in the early AM.  Today, he denies symptoms of palpitations, chest pain, shortness of breath,  lower extremity edema, dizziness, presyncope, or syncope.  The patient is otherwise without complaint today.   Past Medical History  Diagnosis Date  . Syncope, near   . History of echocardiogram     Echocardiogram (01/2013): EF of 50-55%, normal wall motion, trivial AI, mild MR, mild to moderate LAE  . History of cardiovascular stress test     ETT-Myoview (06/07/13): No ischemia, EF 56%; normal study.  . DOE (dyspnea on exertion)   . Chest pain   . Dizziness   . OSA (obstructive sleep apnea)    Past Surgical History  Procedure Laterality Date  . Loop recorder implant  September 2014  . Loop recorder implant N/A 04/05/2013    Procedure: LOOP RECORDER IMPLANT;  Surgeon: Gardiner RhymeJames D Clois Montavon, MD;  Location: [facility] CATH [facility];  Service: Cardiovascular;  Laterality: N/A;    ROS- all systems are reviewed and negatives except as per HPI above  Current Outpatient Prescriptions  Medication Sig Dispense Refill  . alfuzosin (UROXATRAL) 10 MG 24 hr tablet Take 10 mg by mouth daily with breakfast.     . Multiple Vitamins-Minerals (MULTIVITAMIN PO) Take 1 tablet by mouth daily.    Marland Kitchen. omeprazole (PRILOSEC) 40 MG capsule Take 40 mg by mouth daily.     No current facility-administered medications for this visit.    Physical Exam: Filed Vitals:   08/20/14 1047  BP: 126/76  Pulse: 80  Height: 6' (1.829 m)  Weight:  209 lb 9.6 oz (95.074 kg)    GEN- The patient is well appearing, alert and oriented x 3 today.   Head- normocephalic, atraumatic Eyes-  Sclera clear, conjunctiva pink Ears- hearing intact Oropharynx- clear Lungs- Clear to ausculation bilaterally, normal work of breathing Heart- Regular rate and rhythm, no murmurs, rubs or gallops, PMI not laterally displaced GI- soft, NT, ND, + BS Extremities- no clubbing, cyanosis, or edema  ILR interrogation is reviewed today and normal  Assessment and Plan:  1. Syncope No further episodes since ILR placement He has had sinus pauses for which he has been asymptomatic Will continue to monitor conservatively.  If he has symptomatic episodes of bradycardia then pacing would be indicated at that time He is instructed to contact us with any symptoms of bradycardia  2. OSA Compliance with CPAP is encouraged  [facility] home monitoring Return to see me in 1 year Follow-up with Dr Elease HashimotoNahser as scheduled

## 2014-08-21 LAB — MDC_IDC_ENUM_SESS_TYPE_INCLINIC
MDC IDC SESS DTM: 20160208154436
MDC IDC SET ZONE DETECTION INTERVAL: 3000 ms
MDC IDC SET ZONE DETECTION INTERVAL: 390 ms
Zone Setting Detection Interval: 2000 ms

## 2014-08-30 LAB — MDC_IDC_ENUM_SESS_TYPE_REMOTE: Date Time Interrogation Session: 20151225050500

## 2014-09-13 ENCOUNTER — Ambulatory Visit (INDEPENDENT_AMBULATORY_CARE_PROVIDER_SITE_OTHER): Payer: Medicare Other | Admitting: *Deleted

## 2014-09-13 DIAGNOSIS — R55 Syncope and collapse: Secondary | ICD-10-CM | POA: Diagnosis not present

## 2014-09-14 ENCOUNTER — Encounter: Payer: Self-pay | Admitting: Internal Medicine

## 2014-09-18 NOTE — Progress Notes (Signed)
Loop recorder 

## 2014-09-30 LAB — MDC_IDC_ENUM_SESS_TYPE_REMOTE

## 2014-10-12 ENCOUNTER — Ambulatory Visit (INDEPENDENT_AMBULATORY_CARE_PROVIDER_SITE_OTHER): Payer: Medicare Other | Admitting: *Deleted

## 2014-10-12 DIAGNOSIS — R55 Syncope and collapse: Secondary | ICD-10-CM

## 2014-10-12 LAB — MDC_IDC_ENUM_SESS_TYPE_REMOTE

## 2014-10-16 NOTE — Progress Notes (Signed)
Loop recorder 

## 2014-10-17 ENCOUNTER — Encounter: Payer: Self-pay | Admitting: Internal Medicine

## 2014-11-12 ENCOUNTER — Ambulatory Visit (INDEPENDENT_AMBULATORY_CARE_PROVIDER_SITE_OTHER): Payer: Medicare Other | Admitting: *Deleted

## 2014-11-12 DIAGNOSIS — R55 Syncope and collapse: Secondary | ICD-10-CM | POA: Diagnosis not present

## 2014-11-14 NOTE — Progress Notes (Signed)
Loop recorder 

## 2014-11-23 ENCOUNTER — Encounter: Payer: Self-pay | Admitting: Internal Medicine

## 2014-11-29 LAB — CUP PACEART REMOTE DEVICE CHECK: Date Time Interrogation Session: 20160519115438

## 2014-12-12 ENCOUNTER — Ambulatory Visit (INDEPENDENT_AMBULATORY_CARE_PROVIDER_SITE_OTHER): Payer: Medicare Other | Admitting: *Deleted

## 2014-12-12 DIAGNOSIS — R55 Syncope and collapse: Secondary | ICD-10-CM | POA: Diagnosis not present

## 2014-12-13 ENCOUNTER — Encounter: Payer: Self-pay | Admitting: Internal Medicine

## 2014-12-14 NOTE — Progress Notes (Signed)
Loop recorder 

## 2014-12-24 ENCOUNTER — Encounter: Payer: Self-pay | Admitting: Internal Medicine

## 2014-12-26 LAB — CUP PACEART REMOTE DEVICE CHECK: Date Time Interrogation Session: 20160521040500

## 2015-01-08 ENCOUNTER — Encounter: Payer: Self-pay | Admitting: Internal Medicine

## 2015-01-11 ENCOUNTER — Ambulatory Visit (INDEPENDENT_AMBULATORY_CARE_PROVIDER_SITE_OTHER): Payer: Medicare Other | Admitting: *Deleted

## 2015-01-11 DIAGNOSIS — R55 Syncope and collapse: Secondary | ICD-10-CM | POA: Diagnosis not present

## 2015-01-16 ENCOUNTER — Ambulatory Visit: Payer: Medicare Other | Admitting: Neurology

## 2015-01-16 LAB — CUP PACEART REMOTE DEVICE CHECK: Date Time Interrogation Session: 20160706170803

## 2015-01-16 NOTE — Progress Notes (Signed)
Loop recorder 

## 2015-01-17 ENCOUNTER — Ambulatory Visit: Payer: Self-pay | Admitting: Neurology

## 2015-01-30 ENCOUNTER — Encounter: Payer: Self-pay | Admitting: Internal Medicine

## 2015-01-30 ENCOUNTER — Encounter: Payer: Self-pay | Admitting: Neurology

## 2015-01-30 ENCOUNTER — Ambulatory Visit (INDEPENDENT_AMBULATORY_CARE_PROVIDER_SITE_OTHER): Payer: Medicare Other | Admitting: Neurology

## 2015-01-30 VITALS — BP 128/62 | HR 68 | Resp 16 | Ht 72.0 in | Wt 208.0 lb

## 2015-01-30 DIAGNOSIS — Z9989 Dependence on other enabling machines and devices: Principal | ICD-10-CM

## 2015-01-30 DIAGNOSIS — G4733 Obstructive sleep apnea (adult) (pediatric): Secondary | ICD-10-CM

## 2015-01-30 DIAGNOSIS — R55 Syncope and collapse: Secondary | ICD-10-CM | POA: Diagnosis not present

## 2015-01-30 NOTE — Patient Instructions (Signed)
Please continue using your CPAP regularly. While your insurance requires that you use CPAP at least 4 hours each night on 70% of the nights, I recommend, that you not skip any nights and use it throughout the night if you can. Getting used to CPAP and staying with the treatment long term does take time and patience and discipline. Untreated obstructive sleep apnea when it is moderate to severe can have an adverse impact on cardiovascular health and raise her risk for heart disease, arrhythmias, hypertension, congestive heart failure, stroke and diabetes. Untreated obstructive sleep apnea causes sleep disruption, nonrestorative sleep, and sleep deprivation. This can have an impact on your day to day functioning and cause daytime sleepiness and impairment of cognitive function, memory loss, mood disturbance, and problems focussing. Using CPAP regularly can improve these symptoms.  Keep up the good work! I will see you back in a year.  

## 2015-01-30 NOTE — Progress Notes (Signed)
Subjective:    Patient ID: MILTON SAGONA is a 81 y.o. male.  HPI     Interim history:   Mr. Byers is a very pleasant 81 year old right-handed woman with an underlying medical history of reflux disease, syncope and benign prostate hypertrophy who presents for followup consultation of his OSA. He is accompanied by his wife today. I last saw him on 01/15/2014, at which time he reported using CPAP faithfully. He had no problems with using it. He had a loop recorder in place. He denied any recent palpitations, dizziness, chest pain, shortness of breath, or lightheadedness.  Today, 01/30/2015: I reviewed his CPAP compliance data from 12/31/2014 through 01/29/2015 which is a total of 30 days during which time he used his machine every night with percent used days greater than 4 hours at 97%, indicating excellent compliance with an average usage of 6 hours and 54 minutes, residual AHI at 2.8 per hour, leak at 2.8 L/m for the 95th percentile which is great, setting of 8 cm with no EPR.  Today, 01/30/2015: He reports doing well. He has not had any palpitations or chest pain or presyncopal spells. He tries to drink more water. He is using his CPAP with full compliance and no new complaints.  Previously:   I saw him on 07/17/2013, at which time he reported nausea and an upset stomach (after eating a cheeseburger and fries from [facility] the night prior). Her reported mild, intermittent dizziness, no vertigo. I also reviewed his compliance data from 06/17/2013 through 07/16/2013 which is a total of 30 days during which time he was 100% compliant with treatment with a percent used days greater than 4 hours of 100%, indicating excellent compliance. His residual AHI was 2.5 per hour indicating an appropriate treatment pressure of 8 cm with EPR of 2. His average usage was 7 hours and 59 minutes.   I reviewed his compliance data from 11/24/2013 through 12/23/2013 which is a total of 30 days during which time he used  his CPAP every night, percent used days greater than 4 hours was 93%, indicating excellent compliance, average usage was 6 hours and 35 minutes, residual AHI low at 1.3 per hour and leak acceptable at 19.1 L per minute for the 95th percentile, pressure at 8 cm with EPR of 2.  I saw him on 05/18/2013 for followup of his syncope and collapse as well as after his sleep study. At the time, we discussed all his test results including blood work (ANA, CRP, CBC, CK, hemoglobin A1c, CMP, serum electrophoresis, RF, RPR, ESR, TSH), echocardiogram, EEG, cardiac event monitor, and MRA neck and head and MRI of brain as well as his sleep study. His labs were essentially unremarkable, with the exception of his hemoglobin A1c at 5.8. His TSH was normal. His MRI brain showed white matter changes, and repeat MRI brain with contrast showed no abnormal contrast enhancement. His MRA neck and head were unremarkable. His EEG was unremarkable. He had a baseline sleep study on 02/08/2013 which demonstrated a total AHI of 12 per hour, a severe REM related OSA with a REM-related AHI of 48 per hour and an oxyhemoglobin desaturation nadir of 83%. Based on those results I asked him to return for CPAP titration study which he had on 03/21/2013, his CPAP was titrated from 5-9 cm with almost complete resolution of his sleep disordered breathing on a pressure of 8 cm. He was placed on CPAP at a pressure of 8 cm with large nasal pillows.  I reviewed his compliance data from 04/17/2013 through 05/17/2013 which is a total of 31 days during which time he used CPAP every day. His percent used days greater than 4 hours was 100% indicating excellent compliance. His average usage was 7 hours and 24 minutes. His leak was not very high. His residual AHI was 2.2 per hour indicating an appropriate treatment pressure of 8 cm of water with EPR of 2.   He had a 30 day Holter monitor which showed no sustained irregularities but some PVCs. Since then, in late  September he had a implantable loop recorder placed.   Has been experiencing periods of dizziness and blurry vision as well as nausea and vomiting for the past several years, probably at least 2 years. On 01/12/13 he had parked his car in a parking lot and was on his way to the store and collapsed. He had no warning signs, no palpitations, and claims he had no LOC, no HA, SOB. Some 2 years ago, he had an episode of L facial weakness. No hx of staring spells, no twitching, no shaking, no automatism, except occasional lip smacking while driving or while eating.   He presented to the emergency room with midsternal chest pain on 06/01/2013 and was admitted for one day with negative cardiac workup. He was scheduled for an outpatient stress test, which he had on 06/07/2013 and it was negative. He has been seen by cardiology outpatient and needs no further w/u from their end at this time and had a loop recorder placed.   Her Past Medical History Is Significant For: Past Medical History  Diagnosis Date  . Syncope, near   . History of echocardiogram     Echocardiogram (01/2013): EF of 50-55%, normal wall motion, trivial AI, mild MR, mild to moderate LAE  . History of cardiovascular stress test     ETT-Myoview (06/07/13): No ischemia, EF 56%; normal study.  . DOE (dyspnea on exertion)   . Chest pain   . Dizziness   . OSA (obstructive sleep apnea)     Her Past Surgical History Is Significant For: Past Surgical History  Procedure Laterality Date  . Loop recorder implant  September 2014  . Loop recorder implant N/A 04/05/2013    Procedure: LOOP RECORDER IMPLANT;  Surgeon: Coralyn Mark, MD;  Location: [place] CATH LAB;  Service: Cardiovascular;  Laterality: N/A;    Her Family History Is Significant For: Family History  Problem Relation Age of Onset  . CAD Neg Hx     Her Social History Is Significant For: History   Social History  . Marital Status: Married    Spouse Name: N/A  . Number of Children: 1   . Years of Education: N/A   Occupational History  . 'retired    Social History Main Topics  . Smoking status: Former Smoker -- 3.00 packs/day for 30 years    Types: Cigarettes    Quit date: 02/01/1982  . Smokeless tobacco: Not on file  . Alcohol Use: No  . Drug Use: No  . Sexual Activity: Not on file   Other Topics Concern  . None   Social History Narrative    Her Allergies Are:  No Known Allergies:   Her Current Medications Are:  Outpatient Encounter Prescriptions as of 01/30/2015  Medication Sig  . alfuzosin (UROXATRAL) 10 MG 24 hr tablet Take 10 mg by mouth daily with breakfast.   . Multiple Vitamins-Minerals (MULTIVITAMIN PO) Take 1 tablet by mouth daily.  Marland Kitchen  omeprazole (PRILOSEC) 40 MG capsule Take 40 mg by mouth daily.   No facility-administered encounter medications on file as of 01/30/2015.  :  Review of Systems:  Out of a complete 14 point review of systems, all are reviewed and negative with the exception of these symptoms as listed below:   Review of Systems  All other systems reviewed and are negative.   Objective:  Neurologic Exam  Physical Exam Physical Examination:   Filed Vitals:   01/30/15 1406  BP: 128/62  Pulse: 68  Resp: 16    General Examination: The patient is a very pleasant 81 y.o. male in no acute distress. He appears well-developed and well-nourished and well groomed.   HEENT: Normocephalic, atraumatic, pupils are equal, round and reactive to light and accommodation. Funduscopic exam is normal. Extraocular tracking is good without limitation to gaze excursion or nystagmus noted. Normal smooth pursuit is noted. Hearing is grossly intact. Face is slight asymmetric, unchanged. Speech is clear with no dysarthria noted. There is no hypophonia. There is no lip, neck/head, jaw or voice tremor. Neck is supple with full range of passive and active motion. There are no carotid bruits on auscultation. Oropharynx exam reveals: mild mouth dryness,  adequate dental hygiene and moderate airway crowding, due to elongated uvula and redundant soft palate. Mallampati is class II. Tongue protrudes centrally and palate elevates symmetrically.   Chest: Clear to auscultation without wheezing, rhonchi or crackles noted.  Heart: S1+S2+0, regular and normal without murmurs, rubs or gallops noted. He does seem to have an occasional extra heartbeat with a pause.  Abdomen: Soft, non-tender and non-distended with normal bowel sounds appreciated on auscultation.  Extremities: There is no pitting edema in the distal lower extremities bilaterally. Pedal pulses are intact.  Skin: Warm and dry without trophic changes noted. There are no varicose veins. He has vitiligo.   Musculoskeletal: exam reveals no obvious joint deformities, tenderness or joint swelling or erythema.   Neurologically:  Mental status: The patient is awake, alert and oriented in all 4 spheres. His memory, attention, language and knowledge are appropriate. There is no aphasia, agnosia, apraxia or anomia. Speech is clear with normal prosody and enunciation. Thought process is linear. Mood is congruent and affect is normal.  Cranial nerves are as described above under HEENT exam. In addition, shoulder shrug is normal with equal shoulder height noted. Motor exam: Normal bulk, strength and tone is noted. There is no drift, tremor or rebound. Romberg is negative. Reflexes are 2+ throughout. Fine motor skills are intact. Cerebellar testing shows no dysmetria or intention tremor on finger to nose testing. There is no truncal or gait ataxia. Sensory exam is intact to light touch.  Gait, station and balance: he stands without difficulty. No veering to one side is noted. No leaning to one side is noted. No dizziness reported. Posture is age-appropriate and stance is narrow based. No problems turning are noted. He turns en bloc.   Assessment and Plan:   In summary, Elye Harmsen is a very pleasant 53-year  old male with and history of dizziness, and syncope in 2014, who presents for follow-up consultation of his obstructive sleep apnea, and established on treatment with CPAP. For his syncopal spell he had workup from my end of things in the form of brain imaging, EEG and sleep study. He still has an implantable loop monitor and place but had no events. He was seen by Dr. Rayann Heman in February 2016 with one-year follow-up recommended. He has an appointment  with his regular cardiologist in September. He is fully compliant with CPAP treatment and his treatment settings are adequate. He has received updated supplies. He has no new complaints and is doing well from my end of things. I suggested a one-year checkup, sooner if needed. I answered all her questions today and he and his wife were in agreement.  I spent 20 minutes in total face-to-face time with the patient, more than 50% of which was spent in counseling and coordination of care, reviewing test results, reviewing medication and discussing or reviewing the diagnosis of OSA and syncope, the prognosis and treatment options.

## 2015-02-11 ENCOUNTER — Ambulatory Visit (INDEPENDENT_AMBULATORY_CARE_PROVIDER_SITE_OTHER): Payer: Medicare Other | Admitting: *Deleted

## 2015-02-11 DIAGNOSIS — R55 Syncope and collapse: Secondary | ICD-10-CM

## 2015-02-13 NOTE — Progress Notes (Signed)
Loop recorder 

## 2015-02-25 LAB — CUP PACEART REMOTE DEVICE CHECK: Date Time Interrogation Session: 20160815082939

## 2015-03-06 ENCOUNTER — Encounter: Payer: Self-pay | Admitting: Internal Medicine

## 2015-03-12 ENCOUNTER — Ambulatory Visit (INDEPENDENT_AMBULATORY_CARE_PROVIDER_SITE_OTHER): Payer: Medicare Other | Admitting: *Deleted

## 2015-03-12 DIAGNOSIS — R55 Syncope and collapse: Secondary | ICD-10-CM | POA: Diagnosis not present

## 2015-03-13 NOTE — Progress Notes (Signed)
Loop recorder 

## 2015-03-22 LAB — CUP PACEART REMOTE DEVICE CHECK: Date Time Interrogation Session: 20160909130544

## 2015-03-22 NOTE — Progress Notes (Signed)
Carelink summary report received. Battery status OK. Normal device function. No new symptom episodes, tachy episodes, brady, or pause episodes. No new AF episodes. Monthly summary reports and ROV with JA in 08/2015. 

## 2015-04-01 ENCOUNTER — Encounter: Payer: Self-pay | Admitting: Cardiovascular Disease

## 2015-04-01 ENCOUNTER — Ambulatory Visit (INDEPENDENT_AMBULATORY_CARE_PROVIDER_SITE_OTHER): Payer: Medicare Other | Admitting: Cardiovascular Disease

## 2015-04-01 VITALS — BP 120/70 | HR 66 | Ht 72.0 in | Wt 208.1 lb

## 2015-04-01 DIAGNOSIS — R55 Syncope and collapse: Secondary | ICD-10-CM | POA: Diagnosis not present

## 2015-04-01 DIAGNOSIS — R42 Dizziness and giddiness: Secondary | ICD-10-CM | POA: Diagnosis not present

## 2015-04-01 NOTE — Patient Instructions (Signed)
Medication Instructions:  Your physician recommends that you continue on your current medications as directed. Please refer to the Current Medication list given to you today.   Labwork: None Ordered   Testing/Procedures: None Ordered   Follow-Up: Your physician wants you to follow-up in: 1 year with Dr. Nahser.  You will receive a reminder letter in the mail two months in advance. If you don't receive a letter, please call our office to schedule the follow-up appointment.      

## 2015-04-01 NOTE — Progress Notes (Signed)
Colton Bennett Date of Birth  [birth year]       [facility]    [place] [address]  [address] [place], [place]  [address]   [place], [place]  [address] [phone]     [phone]   Fax  [phone]    Fax [phone]  Problem List: 1. TIA 2. Dyspnea   History of Present Illness:  Colton Bennett is a 81 yo with a recent episode of collapse.  He was seen with his wife who provided most of the accurate history - he seems to play his symptoms down quite a bit.    He got out of his truck and collapsed in the parking lot.  He was talking and remained conscious the whole time.   He had severe nausea and vomitting after that.   He remained unsteady and was not able to walk for several hours.  He did not want to go to the doctor or ER.    He felt better after several days.  He were 30 day event monitor. He was found to have normal sinus rhythm. He had occasional episodes of frequent premature ventricular contractions-occasionally in a bigeminal pattern.  He did not have any symptoms while wearing the monitor.  He had an echocardiogram which revealed normal left ventricle systolic function with an ejection fraction of 50-55%. He had normal wall motion. He had trivial aortic insufficiency, mild mitral regurgitation. The left atrium was mildly to moderately dilated.  His EEG was normal.  He's had 2 different sleep studies. The first study was July 31 which revealed severe sleep apnea during REM sleep. He had a second sleep study the results of which are not known at this time.  His MRI was normal. The head MRA is normal.  September 27, 2013:  Mr. Peasley has had an implantable loop recorder placed.   He's had a few episodes of presyncope.  He exercises on occasion. He denies any chest pain or shortness breath.  He was found to have a 3 second pause with his ILR.    Sept. 16,2015:  Colton Bennett has done well.  No further syncopal episodes.   Still has  the implantable loop.    Has been busy working in his garden this summer.   Sept. 19 , 2016: Had an episode of dizziness 2 weeks ago .  No CP      Current Outpatient Prescriptions on File Prior to Visit  Medication Sig Dispense Refill  . alfuzosin (UROXATRAL) 10 MG 24 hr tablet Take 10 mg by mouth daily with breakfast.     . Multiple Vitamins-Minerals (MULTIVITAMIN PO) Take 1 tablet by mouth daily.    Marland Kitchen omeprazole (PRILOSEC) 40 MG capsule Take 40 mg by mouth daily.     No current facility-administered medications on file prior to visit.    No Known Allergies  Past Medical History  Diagnosis Date  . Syncope, near   . History of echocardiogram     Echocardiogram (01/2013): EF of 50-55%, normal wall motion, trivial AI, mild MR, mild to moderate LAE  . History of cardiovascular stress test     ETT-Myoview (06/07/13): No ischemia, EF 56%; normal study.  . DOE (dyspnea on exertion)   . Chest pain   . Dizziness   . OSA (obstructive sleep apnea)     Past Surgical History  Procedure Laterality Date  . Loop recorder implant  September 2014  . Loop recorder  implant N/A 04/05/2013    Procedure: LOOP RECORDER IMPLANT;  Surgeon: Gardiner Rhyme, MD;  Location: [facility] CATH [facility];  Service: Cardiovascular;  Laterality: N/A;    History  Smoking status  . Former Smoker -- 3.00 packs/day for 30 years  . Types: Cigarettes  . Quit date: 02/01/1982  Smokeless tobacco  . Not on file    History  Alcohol Use No    Family History  Problem Relation Age of Onset  . CAD Neg Hx     Reviw of Systems:  Reviewed in the HPI.  All other systems are negative.  Physical Exam: Blood pressure 120/70, pulse 66, height 6' (1.829 m), weight 94.403 kg (208 lb 1.9 oz). General: Well developed, well nourished, in no acute distress.  Head: Normocephalic, atraumatic, sclera non-icteric, mucus membranes are moist,   Neck: Supple. Carotids are 2 + without bruits. No JVD   Lungs: Clear   Heart: RR, normal  S1, S2, occasional PVCs  Abdomen: Soft, non-tender, non-distended with normal bowel sounds.  Msk:  Strength and tone are normal   Extremities: No clubbing or cyanosis. No edema.  Distal pedal pulses are 2+ and equal    Neuro: CN II - XII intact.  Alert and oriented X 3.   Psych:  Normal   ECG: Sept. 19, 2016:  NSR at 87. Normal ECG   Assessment / Plan:   1. Near-syncopal : The patient had an episode of dizziness 2 weeks ago. He has an implantable loop recorder. Interrogation of the loop does not reveal any significant pauses to explain his symptoms.   He has been instructed to use the time marker which will help identify times when he passes out.    I've reassured him that his heart rate and blood pressure are normal in the office today. If the loop recorder does not show any abnormal suspect that his episodes of near syncope are not related to a cardiac condition. I've encouraged him to see his primary medical doctor. I'll see him again in one year.    Nahser, Deloris Ping, MD  04/01/2015 11:14 AM    [facility] [address] [place],  [address] [place], [place]  [address] Pager [phone] Phone: [phone]; Fax: [phone]   [facility]  [address] [place], [place]  [address] [phone]   Fax [phone]

## 2015-04-03 ENCOUNTER — Encounter: Payer: Self-pay | Admitting: Cardiology

## 2015-04-11 ENCOUNTER — Ambulatory Visit (INDEPENDENT_AMBULATORY_CARE_PROVIDER_SITE_OTHER): Payer: Medicare Other | Admitting: *Deleted

## 2015-04-11 DIAGNOSIS — R55 Syncope and collapse: Secondary | ICD-10-CM | POA: Diagnosis not present

## 2015-04-12 NOTE — Progress Notes (Signed)
Loop recorder 

## 2015-04-15 ENCOUNTER — Encounter: Payer: Self-pay | Admitting: Internal Medicine

## 2015-04-18 ENCOUNTER — Encounter: Payer: Self-pay | Admitting: Internal Medicine

## 2015-04-18 ENCOUNTER — Ambulatory Visit (INDEPENDENT_AMBULATORY_CARE_PROVIDER_SITE_OTHER): Payer: Medicare Other | Admitting: *Deleted

## 2015-04-18 DIAGNOSIS — Z4509 Encounter for adjustment and management of other cardiac device: Secondary | ICD-10-CM

## 2015-04-18 LAB — CUP PACEART INCLINIC DEVICE CHECK
MDC IDC SESS DTM: 20161006115619
MDC IDC SET ZONE DETECTION INTERVAL: 2000 ms
MDC IDC SET ZONE DETECTION INTERVAL: 390 ms
Zone Setting Detection Interval: 3000 ms

## 2015-04-18 NOTE — Progress Notes (Signed)
Loop check in clinic for software update for premature RRT. Battery- good. R waves 0.62mV. Pt with 1 tachy episode (previously reviewed) lasting 27 seconds at 158bpm. 2 pause episodes (previously reviewed)-  both appropriate and nocturnal. Pt reports he wears a CPAP at night but may have removed it at the times of the episodes (both early morning hours). Monthly Carelink Summary Reports, ROV with JA in February.

## 2015-05-02 ENCOUNTER — Telehealth: Payer: Self-pay | Admitting: *Deleted

## 2015-05-02 NOTE — Telephone Encounter (Signed)
[facility].  Need to know if patient was symptomatic with pause episode on 04/22/15.

## 2015-05-02 NOTE — Telephone Encounter (Signed)
Patient returned call.  Patient states he does not recall experiencing any symptoms on 04/22/15 and thinks he may have been asleep at the time of the episode.  He denies any questions or concerns at this time and is aware to call with worsening symptoms.

## 2015-05-06 LAB — CUP PACEART REMOTE DEVICE CHECK: MDC IDC SESS DTM: 20160930023702

## 2015-05-06 NOTE — Progress Notes (Signed)
Carelink summary report received. Battery status OK. Normal device function. No new symptom, brady, pause, or AF episodes. 1 tachy episode, ?SVT, duration 27sec, peak V 158bpm, patient asymptomatic. Monthly summary reports and ROV with JA in 08/2015. ROV with device clinic on 04/18/15 to wand for false RRT.

## 2015-05-13 ENCOUNTER — Ambulatory Visit (INDEPENDENT_AMBULATORY_CARE_PROVIDER_SITE_OTHER): Payer: Medicare Other | Admitting: *Deleted

## 2015-05-13 DIAGNOSIS — R55 Syncope and collapse: Secondary | ICD-10-CM

## 2015-05-16 NOTE — Progress Notes (Signed)
Loop recorder 

## 2015-05-23 ENCOUNTER — Encounter: Payer: Self-pay | Admitting: Internal Medicine

## 2015-05-24 ENCOUNTER — Encounter: Payer: Self-pay | Admitting: Internal Medicine

## 2015-06-05 ENCOUNTER — Telehealth: Payer: Self-pay | Admitting: *Deleted

## 2015-06-05 NOTE — Telephone Encounter (Addendum)
[facility].  Need to know if patient was symptomatic with pause episode from 05/26/15.  Also need to request that patient send a manual transmission.

## 2015-06-08 LAB — CUP PACEART REMOTE DEVICE CHECK: MDC IDC SESS DTM: 20161030030614

## 2015-06-08 NOTE — Progress Notes (Signed)
Carelink summary report received. Battery status OK. Normal device function. No new symptom, tachy, brady, or AF episodes. 3 pause episodes, longest from available ECG ~4sec, patient asleep and asymptomatic. Patient instructed on use of symptom activator at 04/01/15 appointment with Dr. Elease HashimotoNahser. Will request full transmission from patient for review of episodes. Monthly summary reports and ROV with JA in 08/2015.

## 2015-06-10 ENCOUNTER — Ambulatory Visit (INDEPENDENT_AMBULATORY_CARE_PROVIDER_SITE_OTHER): Payer: Medicare Other | Admitting: *Deleted

## 2015-06-10 DIAGNOSIS — R55 Syncope and collapse: Secondary | ICD-10-CM

## 2015-06-10 NOTE — Telephone Encounter (Signed)
Patient returned call.  Successfully sent manual transmission.  Patient asymptomatic, aware to call with worsening symptoms, questions, or concerns.  He is appreciative of call and denies additional questions or concerns at this time.

## 2015-06-11 NOTE — Progress Notes (Signed)
Carelink Summary Report / Loop Recorder 

## 2015-06-17 ENCOUNTER — Telehealth: Payer: Self-pay | Admitting: *Deleted

## 2015-06-17 NOTE — Telephone Encounter (Signed)
Called patient regarding pause episodes on Carelink transmissions from 06/13/15 and 06/15/15.  Patient asymptomatic with both episodes (episode on 12/1 was nocturnal).  He is aware to call if he experiences symptoms in the future and has the device clinic phone number.  He denies questions or concerns at this time.

## 2015-06-25 ENCOUNTER — Encounter: Payer: Self-pay | Admitting: Internal Medicine

## 2015-07-04 ENCOUNTER — Encounter: Payer: Self-pay | Admitting: Internal Medicine

## 2015-07-10 ENCOUNTER — Ambulatory Visit (INDEPENDENT_AMBULATORY_CARE_PROVIDER_SITE_OTHER): Payer: Medicare Other | Admitting: *Deleted

## 2015-07-10 DIAGNOSIS — R55 Syncope and collapse: Secondary | ICD-10-CM | POA: Diagnosis not present

## 2015-07-11 NOTE — Progress Notes (Signed)
Carelink Summary Report / Loop Recorder 

## 2015-07-20 LAB — CUP PACEART REMOTE DEVICE CHECK: Date Time Interrogation Session: 20161129030949

## 2015-07-31 ENCOUNTER — Encounter: Payer: Self-pay | Admitting: Internal Medicine

## 2015-08-09 ENCOUNTER — Ambulatory Visit (INDEPENDENT_AMBULATORY_CARE_PROVIDER_SITE_OTHER): Payer: Medicare Other | Admitting: *Deleted

## 2015-08-09 DIAGNOSIS — R55 Syncope and collapse: Secondary | ICD-10-CM

## 2015-08-12 NOTE — Progress Notes (Signed)
Carelink Summary Report / Loop Recorder 

## 2015-08-17 LAB — CUP PACEART REMOTE DEVICE CHECK: MDC IDC SESS DTM: 20161229033627

## 2015-08-22 ENCOUNTER — Telehealth: Payer: Self-pay

## 2015-08-22 ENCOUNTER — Encounter: Payer: Self-pay | Admitting: Internal Medicine

## 2015-08-22 ENCOUNTER — Ambulatory Visit (INDEPENDENT_AMBULATORY_CARE_PROVIDER_SITE_OTHER): Payer: Medicare Other | Admitting: Internal Medicine

## 2015-08-22 VITALS — BP 120/72 | HR 68 | Ht 72.0 in | Wt 215.0 lb

## 2015-08-22 DIAGNOSIS — I495 Sick sinus syndrome: Secondary | ICD-10-CM | POA: Diagnosis not present

## 2015-08-22 DIAGNOSIS — Z01812 Encounter for preprocedural laboratory examination: Secondary | ICD-10-CM | POA: Diagnosis not present

## 2015-08-22 DIAGNOSIS — R42 Dizziness and giddiness: Secondary | ICD-10-CM

## 2015-08-22 DIAGNOSIS — G4733 Obstructive sleep apnea (adult) (pediatric): Secondary | ICD-10-CM

## 2015-08-22 DIAGNOSIS — Z4509 Encounter for adjustment and management of other cardiac device: Secondary | ICD-10-CM

## 2015-08-22 LAB — CUP PACEART INCLINIC DEVICE CHECK: Date Time Interrogation Session: 20170209154034

## 2015-08-22 NOTE — Telephone Encounter (Signed)
Pt scheduled for PPm implant on 2/27. There is opening in [facility] schedule for 2/10, tomorrow, if pt wants to move up otherwise can keep current date.  Called and left pt message to call back as there is opening.

## 2015-08-22 NOTE — Progress Notes (Signed)
PCP: Josue Hector, MD Primary Cardiologist:  Dr Elease Hashimoto Primary EP: Tomi Grandpre  Colton Bennett is a 82 y.o. male who presents today for routine electrophysiology followup.  Since last being seen in our clinic, the patient reports doing very well.  He remains active.  He has had several daytime pauses with symptoms of presyncope.  He describes this as a transient "dimming" of his vision with out full syncope.  This corresponds to pauses of up to 5 seconds on ILR interrogation.  Today, he denies symptoms of palpitations, chest pain, shortness of breath,  lower extremity edema.  The patient is otherwise without complaint today.   Past Medical History  Diagnosis Date  . Syncope, near   . History of echocardiogram     Echocardiogram (01/2013): EF of 50-55%, normal wall motion, trivial AI, mild MR, mild to moderate LAE  . History of cardiovascular stress test     ETT-Myoview (06/07/13): No ischemia, EF 56%; normal study.  . DOE (dyspnea on exertion)   . Chest pain   . Dizziness   . OSA (obstructive sleep apnea)    Past Surgical History  Procedure Laterality Date  . Loop recorder implant  September 2014  . Loop recorder implant N/A 04/05/2013    Procedure: LOOP RECORDER IMPLANT;  Surgeon: Gardiner Rhyme, MD;  Location: [facility] CATH LAB;  Service: Cardiovascular;  Laterality: N/A;    ROS- all systems are reviewed and negatives except as per HPI above  Current Outpatient Prescriptions  Medication Sig Dispense Refill  . alfuzosin (UROXATRAL) 10 MG 24 hr tablet Take 10 mg by mouth daily with breakfast.     . cyanocobalamin 2000 MCG tablet Take 2,500 mcg by mouth daily.    . Multiple Vitamins-Minerals (MULTIVITAMIN PO) Take 1 tablet by mouth daily.    Marland Kitchen omeprazole (PRILOSEC) 40 MG capsule Take 40 mg by mouth daily.     No current facility-administered medications for this visit.    Physical Exam: Filed Vitals:   08/22/15 1050  BP: 120/72  Pulse: 68  Height: 6' (1.829 m)  Weight: 215 lb  (97.523 kg)    GEN- The patient is well appearing, alert and oriented x 3 today.   Head- normocephalic, atraumatic Eyes-  Sclera clear, conjunctiva pink Ears- hearing intact Oropharynx- clear Lungs- Clear to ausculation bilaterally, normal work of breathing Heart- Regular rate and rhythm, no murmurs, rubs or gallops, PMI not laterally displaced GI- soft, NT, ND, + BS Extremities- no clubbing, cyanosis, or edema  Echo 2014 reveals preserved EF  Assessment and Plan:  1. Syncope Due to sinus pauses He continues to have daytime pauses with symptoms of presyncope (ILR reviewed).  No reversible causes have been found.  He has not had frank syncope. The patient has symptomatic sinus bradycardia.  I would therefore recommend pacemaker implantation at this time.  Risks, benefits, alternatives to pacemaker implantation were discussed in detail with the patient today. The patient understands that the risks include but are not limited to bleeding, infection, pneumothorax, perforation, tamponade, vascular damage, renal failure, MI, stroke, death,  and lead dislodgement and wishes to proceed. We will therefore schedule the procedure at the next available time.  Will remove ILR at time of PPM implantation.   2. OSA Compliance with CPAP is encouraged  Hillis Range MD, [facility] 08/22/2015 12:51 PM

## 2015-08-22 NOTE — Telephone Encounter (Signed)
Spoke with patient and have moved him to tomorrow due to a procedure being moved off the schedule.  He will be at the hospital at 8am.  NPO after midnight and will have his labs drawn at the hospital

## 2015-08-22 NOTE — Patient Instructions (Signed)
Medication Instructions:  Your physician recommends that you continue on your current medications as directed. Please refer to the Current Medication list given to you today.   Labwork: Your physician recommends that you return for lab work: On OR after 08/26/2015 (BMET/CBC)   Testing/Procedures: none  Follow-Up: Your physician recommends that you schedule a follow-up appointment in: 10-14 days, from 09/09/2015 in Device for Wound Check (prefer 3/9-3/10)  Your physician wants you to follow-up in: 3 months with Dr. Jacquiline Doe will receive a reminder letter in the mail two months in advance. If you don't receive a letter, please call our office to schedule the follow-up appointment.   Any Other Special Instructions Will Be Listed Below (If Applicable).     If you need a refill on your cardiac medications before your next appointment, please call your pharmacy.

## 2015-08-23 ENCOUNTER — Encounter (HOSPITAL_COMMUNITY): Payer: Self-pay | Admitting: Internal Medicine

## 2015-08-23 ENCOUNTER — Encounter (HOSPITAL_COMMUNITY)
Admission: RE | Disposition: A | Discharge status: Home / Self Care | Payer: Self-pay | Source: Ambulatory Visit | Attending: Internal Medicine

## 2015-08-23 ENCOUNTER — Ambulatory Visit (HOSPITAL_COMMUNITY)
Admission: RE | Admit: 2015-08-23 | Discharge: 2015-08-24 | Disposition: A | Discharge status: Home / Self Care | Payer: Medicare Other | Source: Ambulatory Visit | Attending: Internal Medicine | Admitting: Internal Medicine

## 2015-08-23 DIAGNOSIS — I495 Sick sinus syndrome: Secondary | ICD-10-CM | POA: Diagnosis not present

## 2015-08-23 DIAGNOSIS — Z4509 Encounter for adjustment and management of other cardiac device: Secondary | ICD-10-CM | POA: Insufficient documentation

## 2015-08-23 DIAGNOSIS — G4733 Obstructive sleep apnea (adult) (pediatric): Secondary | ICD-10-CM | POA: Diagnosis not present

## 2015-08-23 DIAGNOSIS — Z79899 Other long term (current) drug therapy: Secondary | ICD-10-CM | POA: Insufficient documentation

## 2015-08-23 DIAGNOSIS — R001 Bradycardia, unspecified: Secondary | ICD-10-CM | POA: Diagnosis present

## 2015-08-23 DIAGNOSIS — Z959 Presence of cardiac and vascular implant and graft, unspecified: Secondary | ICD-10-CM

## 2015-08-23 HISTORY — PX: EP IMPLANTABLE DEVICE: SHX172B

## 2015-08-23 HISTORY — DX: Obstructive sleep apnea (adult) (pediatric): G47.33

## 2015-08-23 HISTORY — DX: Sick sinus syndrome: I49.5

## 2015-08-23 HISTORY — DX: Gastro-esophageal reflux disease without esophagitis: K21.9

## 2015-08-23 HISTORY — DX: Presence of cardiac pacemaker: Z95.0

## 2015-08-23 HISTORY — DX: Dependence on other enabling machines and devices: Z99.89

## 2015-08-23 LAB — CBC
HEMATOCRIT: 38.9 % — AB (ref 39.0–52.0)
HEMOGLOBIN: 13.1 g/dL (ref 13.0–17.0)
MCH: 31 pg (ref 26.0–34.0)
MCHC: 33.7 g/dL (ref 30.0–36.0)
MCV: 92 fL (ref 78.0–100.0)
Platelets: 209 10*3/uL (ref 150–400)
RBC: 4.23 MIL/uL (ref 4.22–5.81)
RDW: 12.7 % (ref 11.5–15.5)
WBC: 6.6 10*3/uL (ref 4.0–10.5)

## 2015-08-23 LAB — BASIC METABOLIC PANEL
Anion gap: 7 (ref 5–15)
BUN: 11 mg/dL (ref 6–20)
CALCIUM: 9.2 mg/dL (ref 8.9–10.3)
CO2: 27 mmol/L (ref 22–32)
Chloride: 107 mmol/L (ref 101–111)
Creatinine, Ser: 0.97 mg/dL (ref 0.61–1.24)
GFR calc Af Amer: >60 mL/min (ref >60)
GLUCOSE: 106 mg/dL — AB (ref 65–99)
Potassium: 4.1 mmol/L (ref 3.5–5.1)
SODIUM: 141 mmol/L (ref 135–145)

## 2015-08-23 LAB — SURGICAL PCR SCREEN
MRSA, PCR: NEGATIVE
Staphylococcus aureus: NEGATIVE

## 2015-08-23 SURGERY — PACEMAKER IMPLANT
Anesthesia: LOCAL

## 2015-08-23 MED ORDER — SODIUM CHLORIDE 0.9 % IR SOLN
80.0000 mg | Status: AC
  Administered 2015-08-23: 80 mg
  Filled 2015-08-23: qty 2

## 2015-08-23 MED ORDER — ONDANSETRON HCL 4 MG/2ML IJ SOLN: 4.0000 mg | Freq: Four times a day (QID) | INTRAMUSCULAR | Status: DC | PRN

## 2015-08-23 MED ORDER — HYDROCODONE-ACETAMINOPHEN 5-325 MG PO TABS
1.0000 | ORAL_TABLET | ORAL | Status: DC | PRN
  Administered 2015-08-23: 18:00:00 1 via ORAL
  Administered 2015-08-23 – 2015-08-24 (×2): 2 via ORAL
  Filled 2015-08-23: qty 1
  Filled 2015-08-23 (×2): qty 2

## 2015-08-23 MED ORDER — ACETAMINOPHEN 325 MG PO TABS
325.0000 mg | ORAL_TABLET | ORAL | Status: DC | PRN
  Administered 2015-08-23: 650 mg via ORAL
  Filled 2015-08-23: qty 2

## 2015-08-23 MED ORDER — CEFAZOLIN SODIUM-DEXTROSE 2-3 GM-% IV SOLR
INTRAVENOUS | Status: AC
  Filled 2015-08-23: qty 50

## 2015-08-23 MED ORDER — HEPARIN (PORCINE) IN NACL 2-0.9 UNIT/ML-% IJ SOLN
INTRAMUSCULAR | Status: DC | PRN
  Administered 2015-08-23: 500 mL

## 2015-08-23 MED ORDER — IOHEXOL 350 MG/ML SOLN
INTRAVENOUS | Status: DC | PRN
Start: 2015-08-23 — End: 2015-08-23
  Administered 2015-08-23: 15 mL via INTRAVENOUS

## 2015-08-23 MED ORDER — ALFUZOSIN HCL ER 10 MG PO TB24
10.0000 mg | ORAL_TABLET | Freq: Every day | ORAL | Status: DC
  Filled 2015-08-23 (×2): qty 1

## 2015-08-23 MED ORDER — SODIUM CHLORIDE 0.9 % IR SOLN
Status: AC
  Filled 2015-08-23: qty 2

## 2015-08-23 MED ORDER — MUPIROCIN 2 % EX OINT
TOPICAL_OINTMENT | CUTANEOUS | Status: AC
  Administered 2015-08-23: 1 via TOPICAL
  Filled 2015-08-23: qty 22

## 2015-08-23 MED ORDER — FENTANYL CITRATE (PF) 100 MCG/2ML IJ SOLN
INTRAMUSCULAR | Status: AC
  Filled 2015-08-23: qty 2

## 2015-08-23 MED ORDER — YOU HAVE A PACEMAKER BOOK
Freq: Once | Status: AC
  Administered 2015-08-23: 23:00:00
  Filled 2015-08-23: qty 1

## 2015-08-23 MED ORDER — SODIUM CHLORIDE 0.9 % IV SOLN
INTRAVENOUS | Status: DC
Start: 2015-08-23 — End: 2015-08-23

## 2015-08-23 MED ORDER — LIDOCAINE HCL (PF) 1 % IJ SOLN
INTRAMUSCULAR | Status: AC
  Filled 2015-08-23: qty 60

## 2015-08-23 MED ORDER — MUPIROCIN 2 % EX OINT
1.0000 "application " | TOPICAL_OINTMENT | Freq: Once | CUTANEOUS | Status: AC
  Administered 2015-08-23: 1 via TOPICAL

## 2015-08-23 MED ORDER — SODIUM CHLORIDE 0.9% FLUSH
3.0000 mL | Freq: Two times a day (BID) | INTRAVENOUS | Status: DC
  Administered 2015-08-23: 3 mL via INTRAVENOUS

## 2015-08-23 MED ORDER — SODIUM CHLORIDE 0.9 % IV SOLN: 250.0000 mL | INTRAVENOUS | Status: DC | PRN

## 2015-08-23 MED ORDER — CHLORHEXIDINE GLUCONATE 4 % EX LIQD
60.0000 mL | Freq: Once | CUTANEOUS | Status: DC
  Filled 2015-08-23: qty 60

## 2015-08-23 MED ORDER — MIDAZOLAM HCL 5 MG/5ML IJ SOLN
INTRAMUSCULAR | Status: AC
  Filled 2015-08-23: qty 5

## 2015-08-23 MED ORDER — LIDOCAINE HCL (PF) 1 % IJ SOLN
INTRAMUSCULAR | Status: DC | PRN
Start: 2015-08-23 — End: 2015-08-23
  Administered 2015-08-23: 30 mL

## 2015-08-23 MED ORDER — CEFAZOLIN SODIUM-DEXTROSE 2-3 GM-% IV SOLR
2.0000 g | INTRAVENOUS | Status: AC
  Administered 2015-08-23: 2 g via INTRAVENOUS

## 2015-08-23 MED ORDER — SODIUM CHLORIDE 0.9% FLUSH: 3.0000 mL | INTRAVENOUS | Status: DC | PRN

## 2015-08-23 MED ORDER — MIDAZOLAM HCL 5 MG/5ML IJ SOLN
INTRAMUSCULAR | Status: DC | PRN
Start: 2015-08-23 — End: 2015-08-23
  Administered 2015-08-23: 1 mg via INTRAVENOUS

## 2015-08-23 MED ORDER — PANTOPRAZOLE SODIUM 40 MG PO TBEC
40.0000 mg | DELAYED_RELEASE_TABLET | Freq: Every day | ORAL | Status: DC
  Administered 2015-08-23: 15:00:00 40 mg via ORAL
  Filled 2015-08-23: qty 1

## 2015-08-23 MED ORDER — FENTANYL CITRATE (PF) 100 MCG/2ML IJ SOLN
INTRAMUSCULAR | Status: DC | PRN
  Administered 2015-08-23: 12.5 ug via INTRAVENOUS

## 2015-08-23 MED ORDER — HEPARIN (PORCINE) IN NACL 2-0.9 UNIT/ML-% IJ SOLN
INTRAMUSCULAR | Status: AC
  Filled 2015-08-23: qty 500

## 2015-08-23 MED ORDER — CEFAZOLIN SODIUM 1-5 GM-% IV SOLN
1.0000 g | Freq: Four times a day (QID) | INTRAVENOUS | Status: AC
  Administered 2015-08-23 – 2015-08-24 (×3): 1 g via INTRAVENOUS
  Filled 2015-08-23 (×3): qty 50

## 2015-08-23 SURGICAL SUPPLY — 7 items
CABLE SURGICAL S-101-97-12 (CABLE) ×2 IMPLANT
LEAD CAPSURE NOVUS 5076-52CM (Lead) ×2 IMPLANT
LEAD CAPSURE NOVUS 5076-58CM (Lead) ×2 IMPLANT
PAD DEFIB LIFELINK (PAD) ×2 IMPLANT
PPM ADVISA MRI DR A2DR01 (Pacemaker) ×2 IMPLANT
SHEATH CLASSIC 7F (SHEATH) ×4 IMPLANT
TRAY PACEMAKER INSERTION (PACKS) ×2 IMPLANT

## 2015-08-23 NOTE — Discharge Summary (Signed)
ELECTROPHYSIOLOGY PROCEDURE DISCHARGE SUMMARY    Patient ID: Colton Bennett,  MRN: [id number], DOB/AGE: June 13, 1934 82 y.o.  Admit date: 08/23/2015 Discharge date: 08/24/2015  Primary Care Physician: Josue Hector, MD Primary Cardiologist: Nahser Electrophysiologist: Maven Rosander  Primary Discharge Diagnosis:  Symptomatic bradycardia status post pacemaker implantation this admission  Secondary Discharge Diagnosis:  1.  OSA 2.  Pre-synceop  No Known Allergies   Procedures This Admission:  1.  Implantation of a MDT daul chamber PPM on 08/23/15 by Dr Johney Frame.  The patient received a MDT model number Advisa PPM with model number 5076 right atrial lead and 5076 right ventricular lead. His previously implanted loop recorder was explanted. There were no immediate post procedure complications. 2.  CXR on 08/24/15 demonstrated no pneumothorax status post device implantation.   Brief HPI: Colton Bennett is a 82 y.o. male with a history of dizziness and pre-syncope. He underwent ILR implantation with daytime pauses identified associated with symptoms. The patient has had symptomatic bradycardia without reversible causes identified.  Risks, benefits, and alternatives to PPM implantation were reviewed with the patient who wished to proceed.   Hospital Course:  The patient was admitted and underwent implantation of a MDT MRI compatible dual chamber pacemaker with details as outlined above.  He  was monitored on telemetry overnight which demonstrated sinus rhythm.  Left chest was without hematoma or ecchymosis.  The device was interrogated and found to be functioning normally.  CXR was obtained and demonstrated no pneumothorax status post device implantation.  Wound care, arm mobility, and restrictions were reviewed with the patient.  The patient was examined and considered stable for discharge to home.    Physical Exam: Filed Vitals:   08/23/15 2015 08/23/15 2146 08/24/15 0332 08/24/15 0801    BP:   121/58 119/59  Pulse:   58 63  Temp:   98.1 F (36.7 C) 98.3 F (36.8 C)  TempSrc:   Oral Oral  Resp: Height:      Weight:   222 lb 3.6 oz (100.8 kg)   SpO2:   96% 97%    GEN- The patient is well appearing, alert and oriented x 3 today.   HEENT: normocephalic, atraumatic; sclera clear, conjunctiva pink; hearing intact; oropharynx clear; neck supple, no JVP Lymph- no cervical lymphadenopathy Lungs- Clear to ausculation bilaterally, normal work of breathing.  No wheezes, rales, rhonchi Heart- Regular rate and rhythm, no murmurs, rubs or gallops, PMI not laterally displaced GI- soft, non-tender, non-distended, bowel sounds present, no hepatosplenomegaly Extremities- no clubbing, cyanosis, or edema; DP/PT/radial pulses 2+ bilaterally MS- no significant deformity or atrophy Skin- warm and dry, no rash or lesion, left chest without hematoma/ecchymosis Psych- euthymic mood, full affect Neuro- strength and sensation are intact   Labs:   Lab Results  Component Value Date   WBC 6.6 08/23/2015   HGB 13.1 08/23/2015   HCT 38.9* 08/23/2015   MCV 92.0 08/23/2015   PLT 209 08/23/2015     Recent Labs Lab 08/24/15 0301  NA 142  K 4.7  CL 107  CO2 26  BUN 13  CREATININE 1.24  CALCIUM 9.4  GLUCOSE 108*    Discharge Medications:    Medication List    TAKE these medications        alfuzosin 10 MG 24 hr tablet  Commonly known as:  UROXATRAL  Take 10 mg by mouth daily with breakfast.     cyanocobalamin 2000 MCG tablet  Take 2,500  mcg by mouth daily.     MULTIVITAMIN PO  Take 1 tablet by mouth daily.     omeprazole 40 MG capsule  Commonly known as:  PRILOSEC  Take 40 mg by mouth daily.        Disposition:   Follow-up Information    Follow up with [facility] On 09/05/2015.   Specialty:  Cardiology   Why:  at 4:30PM for wound check    Contact information:   [address], [address] [place]  [address] [phone]      Follow up with Hillis Range, MD On 12/11/2015.   Specialty:  Cardiology   Why:  at [facility] information:   [address] [place] [address] [phone]       Duration of Discharge Encounter: Greater than 30 minutes including physician time.  Randolm Idol MD  08/24/2015 8:31 AM

## 2015-08-23 NOTE — Progress Notes (Signed)
Patient remains in SB-SR with 1st degree block, 50;s to 70's rate at rest and 90's for a rate when OOB to bathroom

## 2015-08-23 NOTE — Interval H&P Note (Signed)
History and Physical Interval Note:  08/23/2015 11:12 AM  Colton Bennett  has presented today for surgery, with the diagnosis of sick sinus syndrome  The various methods of treatment have been discussed with the patient and family. After consideration of risks, benefits and other options for treatment, the patient has consented to  Procedure(s): Pacemaker Implant (N/A) Loop Recorder Removal (N/A) as a surgical intervention .  The patient's history has been reviewed, patient examined, no change in status, stable for surgery.  I have reviewed the patient's chart and labs.  Questions were answered to the patient's satisfaction.     Hillis Range

## 2015-08-23 NOTE — H&P (View-Only) (Signed)
PCP: NYLAND,LEONARD ROBERT, MD Primary Cardiologist:  Dr Nahser Primary EP: Juwana Thoreson  Colton Bennett is a 81 y.o. male who presents today for routine electrophysiology followup.  Since last being seen in our clinic, the patient reports doing very well.  He remains active.  He has had several daytime pauses with symptoms of presyncope.  He describes this as a transient "dimming" of his vision with out full syncope.  This corresponds to pauses of up to 5 seconds on ILR interrogation.  Today, he denies symptoms of palpitations, chest pain, shortness of breath,  lower extremity edema.  The patient is otherwise without complaint today.   Past Medical History  Diagnosis Date  . Syncope, near   . History of echocardiogram     Echocardiogram (01/2013): EF of 50-55%, normal wall motion, trivial AI, mild MR, mild to moderate LAE  . History of cardiovascular stress test     ETT-Myoview (06/07/13): No ischemia, EF 56%; normal study.  . DOE (dyspnea on exertion)   . Chest pain   . Dizziness   . OSA (obstructive sleep apnea)    Past Surgical History  Procedure Laterality Date  . Loop recorder implant  September 2014  . Loop recorder implant N/A 04/05/2013    Procedure: LOOP RECORDER IMPLANT;  Surgeon: [name] D Aubrey Blackard, MD;  Location: [facility];  Service: Cardiovascular;  Laterality: N/A;    ROS- all systems are reviewed and negatives except as per HPI above  Current Outpatient Prescriptions  Medication Sig Dispense Refill  . alfuzosin (UROXATRAL) 10 MG 24 hr tablet Take 10 mg by mouth daily with breakfast.     . cyanocobalamin 2000 MCG tablet Take 2,500 mcg by mouth daily.    . Multiple Vitamins-Minerals (MULTIVITAMIN PO) Take 1 tablet by mouth daily.    . omeprazole (PRILOSEC) 40 MG capsule Take 40 mg by mouth daily.     No current facility-administered medications for this visit.    Physical Exam: Filed Vitals:   08/22/15 1050  BP: 120/72  Pulse: 68  Height: 6' (1.829 m)  Weight: 215 lb  (97.523 kg)    GEN- The patient is well appearing, alert and oriented x 3 today.   Head- normocephalic, atraumatic Eyes-  Sclera clear, conjunctiva pink Ears- hearing intact Oropharynx- clear Lungs- Clear to ausculation bilaterally, normal work of breathing Heart- Regular rate and rhythm, no murmurs, rubs or gallops, PMI not laterally displaced GI- soft, NT, ND, + BS Extremities- no clubbing, cyanosis, or edema  Echo 2014 reveals preserved EF  Assessment and Plan:  1. Syncope Due to sinus pauses He continues to have daytime pauses with symptoms of presyncope (ILR reviewed).  No reversible causes have been found.  He has not had frank syncope. The patient has symptomatic sinus bradycardia.  I would therefore recommend pacemaker implantation at this time.  Risks, benefits, alternatives to pacemaker implantation were discussed in detail with the patient today. The patient understands that the risks include but are not limited to bleeding, infection, pneumothorax, perforation, tamponade, vascular damage, renal failure, MI, stroke, death,  and lead dislodgement and wishes to proceed. We will therefore schedule the procedure at the next available time.  Will remove ILR at time of PPM implantation.   2. OSA Compliance with CPAP is encouraged  Jeriko Kowalke MD, FACC 08/22/2015 12:51 PM   

## 2015-08-23 NOTE — Discharge Instructions (Signed)
° ° °  Supplemental Discharge Instructions for  Pacemaker/Defibrillator Patients  Activity No heavy lifting or vigorous activity with your left/right arm for 6 to 8 weeks.  Do not raise your left/right arm above your head for one week.  Gradually raise your affected arm as drawn below.           __      08/27/15                  08/28/15                    08/29/15                 08/30/15  NO DRIVING for   1 week  ; you may begin driving on  07/14/70   .  WOUND CARE - Keep the wound area clean and dry.  Do not get this area wet for one week. No showers for one week; you may shower on   08/30/15  . - The tape/steri-strips on your wound will fall off; do not pull them off.  No bandage is needed on the site.  DO  NOT apply any creams, oils, or ointments to the wound area. - If you notice any drainage or discharge from the wound, any swelling or bruising at the site, or you develop a fever > 101? F after you are discharged home, call the office at once.  Special Instructions - You are still able to use cellular telephones; use the ear opposite the side where you have your pacemaker/defibrillator.  Avoid carrying your cellular phone near your device. - When traveling through airports, show security personnel your identification card to avoid being screened in the metal detectors.  Ask the security personnel to use the hand wand. - Avoid arc welding equipment, MRI testing (magnetic resonance imaging), TENS units (transcutaneous nerve stimulators).  Call the office for questions about other devices. - Avoid electrical appliances that are in poor condition or are not properly grounded. - Microwave ovens are safe to be near or to operate.

## 2015-08-24 ENCOUNTER — Other Ambulatory Visit: Payer: Self-pay

## 2015-08-24 ENCOUNTER — Ambulatory Visit (HOSPITAL_COMMUNITY): Payer: Medicare Other

## 2015-08-24 DIAGNOSIS — G4733 Obstructive sleep apnea (adult) (pediatric): Secondary | ICD-10-CM | POA: Diagnosis not present

## 2015-08-24 DIAGNOSIS — I495 Sick sinus syndrome: Secondary | ICD-10-CM | POA: Diagnosis not present

## 2015-08-24 DIAGNOSIS — Z79899 Other long term (current) drug therapy: Secondary | ICD-10-CM | POA: Diagnosis not present

## 2015-08-24 DIAGNOSIS — Z4509 Encounter for adjustment and management of other cardiac device: Secondary | ICD-10-CM | POA: Diagnosis not present

## 2015-08-24 LAB — BASIC METABOLIC PANEL
Anion gap: 9 (ref 5–15)
BUN: 13 mg/dL (ref 6–20)
CALCIUM: 9.4 mg/dL (ref 8.9–10.3)
CO2: 26 mmol/L (ref 22–32)
Chloride: 107 mmol/L (ref 101–111)
Creatinine, Ser: 1.24 mg/dL (ref 0.61–1.24)
GFR calc Af Amer: >60 mL/min (ref >60)
GFR, EST NON AFRICAN AMERICAN: 54 mL/min — AB (ref >60)
GLUCOSE: 108 mg/dL — AB (ref 65–99)
POTASSIUM: 4.7 mmol/L (ref 3.5–5.1)
SODIUM: 142 mmol/L (ref 135–145)

## 2015-08-26 ENCOUNTER — Other Ambulatory Visit: Payer: Medicare Other

## 2015-09-05 ENCOUNTER — Encounter: Payer: Self-pay | Admitting: Internal Medicine

## 2015-09-05 ENCOUNTER — Ambulatory Visit (INDEPENDENT_AMBULATORY_CARE_PROVIDER_SITE_OTHER): Payer: Medicare Other | Admitting: *Deleted

## 2015-09-05 DIAGNOSIS — I495 Sick sinus syndrome: Secondary | ICD-10-CM | POA: Diagnosis not present

## 2015-09-05 LAB — CUP PACEART INCLINIC DEVICE CHECK
Battery Voltage: 3.14 V
Brady Statistic AS VP Percent: 0.05 %
Brady Statistic AS VS Percent: 99.83 %
Brady Statistic RA Percent Paced: 0.12 %
Implantable Lead Implant Date: 20170210
Implantable Lead Location: 753859
Implantable Lead Model: 5076
Implantable Lead Model: 5076
Lead Channel Impedance Value: 418 Ohm
Lead Channel Impedance Value: 456 Ohm
Lead Channel Pacing Threshold Amplitude: 0.75 V
Lead Channel Pacing Threshold Amplitude: 1.125 V
Lead Channel Sensing Intrinsic Amplitude: 1.875 mV
Lead Channel Sensing Intrinsic Amplitude: 15 mV
Lead Channel Sensing Intrinsic Amplitude: 2.5 mV
Lead Channel Setting Pacing Amplitude: 3.5 V
Lead Channel Setting Pacing Amplitude: 3.5 V
Lead Channel Setting Sensing Sensitivity: 2.8 mV
MDC IDC LEAD IMPLANT DT: 20170210
MDC IDC LEAD LOCATION: 753860
MDC IDC MSMT LEADCHNL RA IMPEDANCE VALUE: 342 Ohm
MDC IDC MSMT LEADCHNL RA PACING THRESHOLD PULSEWIDTH: 0.4 ms
MDC IDC MSMT LEADCHNL RV IMPEDANCE VALUE: 551 Ohm
MDC IDC MSMT LEADCHNL RV PACING THRESHOLD PULSEWIDTH: 0.4 ms
MDC IDC MSMT LEADCHNL RV SENSING INTR AMPL: 15 mV
MDC IDC SESS DTM: 20170223165642
MDC IDC SET LEADCHNL RV PACING PULSEWIDTH: 0.4 ms
MDC IDC STAT BRADY AP VP PERCENT: 0 %
MDC IDC STAT BRADY AP VS PERCENT: 0.12 %
MDC IDC STAT BRADY RV PERCENT PACED: 0.05 %

## 2015-09-05 NOTE — Progress Notes (Signed)
Wound check appointment. Steri-strips removed from linq explant and new ppm implant sites. Wounds without redness or edema. Incision edges approximated, wounds well healed. Normal device function. Thresholds, sensing, and impedances consistent with implant measurements. Device programmed at 3.5Vfor extra safety margin until 3 month visit. Histogram distribution appropriate for patient and level of activity. No mode switches or high ventricular rates noted. Patient educated about wound care, arm mobility, lifting restrictions. ROV with JA 12/11/15.

## 2015-09-10 ENCOUNTER — Encounter: Payer: Self-pay | Admitting: Internal Medicine

## 2015-09-23 ENCOUNTER — Ambulatory Visit: Payer: Self-pay

## 2015-09-28 LAB — CUP PACEART REMOTE DEVICE CHECK: MDC IDC SESS DTM: 20170128033715

## 2015-09-28 NOTE — Progress Notes (Signed)
Carelink summary report received. Battery status OK. Normal device function. No new symptom episodes, tachy episodes, or brady episodes. No new AF episodes. 1 pause episode- previously addressed. Monthly summary reports and ROV/PRN 

## 2015-10-07 ENCOUNTER — Telehealth: Payer: Self-pay | Admitting: Neurology

## 2015-10-07 NOTE — Telephone Encounter (Signed)
I spoke to patient and reminded him that his appt is in July. We need to show insurance at that time that in the last 30 days he has shown compliance. He also had some questions about pacemaker vs CPAP. I advised him that the pacemaker will keep his heart from skipping a beat and CPAP helps with his apenic events.

## 2015-10-07 NOTE — Telephone Encounter (Signed)
Pt is calling saying that his cpap is out of order and it will take a week for the parts to come in. Will this be a problem? He wants to make sure it will be ok with insurance. He says that he has had pace maker put in and wants to know if he will still need the cpap. Please call and advise thank you

## 2015-12-11 ENCOUNTER — Other Ambulatory Visit: Payer: Self-pay | Admitting: Internal Medicine

## 2015-12-11 ENCOUNTER — Encounter: Payer: Self-pay | Admitting: Internal Medicine

## 2015-12-11 ENCOUNTER — Ambulatory Visit (INDEPENDENT_AMBULATORY_CARE_PROVIDER_SITE_OTHER): Payer: Medicare Other | Admitting: Internal Medicine

## 2015-12-11 VITALS — BP 120/60 | HR 72 | Ht 72.0 in | Wt 215.0 lb

## 2015-12-11 DIAGNOSIS — G4733 Obstructive sleep apnea (adult) (pediatric): Secondary | ICD-10-CM

## 2015-12-11 DIAGNOSIS — I495 Sick sinus syndrome: Secondary | ICD-10-CM

## 2015-12-11 NOTE — Patient Instructions (Signed)
Medication Instructions:  Your physician recommends that you continue on your current medications as directed. Please refer to the Current Medication list given to you today.  Labwork: None ordered  Testing/Procedures: None ordered  Follow-Up: Remote monitoring is used to monitor your Pacemaker of ICD from home. This monitoring reduces the number of office visits required to check your device to one time per year. It allows us to keep an eye on the functioning of your device to ensure it is working properly. You are scheduled for a device check from home on 03/11/2016. You may send your transmission at any time that day. If you have a wireless device, the transmission will be sent automatically. After your physician reviews your transmission, you will receive a postcard with your next transmission date.  Your physician wants you to follow-up in: 1 year with Dr. Johney FrameAllred. You will receive a reminder letter in the mail two months in advance. If you don't receive a letter, please call our office to schedule the follow-up appointment.  If you need a refill on your cardiac medications before your next appointment, please call your pharmacy.  Thank you for choosing [facility]!!     Any Other Special Instructions Will Be Listed Below (If Applicable).

## 2015-12-11 NOTE — Progress Notes (Signed)
PCP: Colton Bennett,Colton ROBERT, MD Primary Cardiologist:  Colton Bennett  Colton Bennett is a 82 y.o. male who presents today for routine electrophysiology followup.  Since his recent pacemaker implantation, the patient reports doing very well.  Presyncope has resolved.  Today, he denies symptoms of palpitations, chest pain, shortness of breath,  lower extremity edema, dizziness, or syncope.  The patient is otherwise without complaint today.   Past Medical History  Diagnosis Date  . Syncope, near   . History of echocardiogram     Echocardiogram (01/2013): EF of 50-55%, normal wall motion, trivial AI, mild MR, mild to moderate LAE  . History of cardiovascular stress test     ETT-Myoview (06/07/13): No ischemia, EF 56%; normal study.  . DOE (dyspnea on exertion)   . Chest pain   . Dizziness   . Presence of permanent cardiac pacemaker   . OSA on CPAP   . GERD (gastroesophageal reflux disease)   . SSS (sick sinus syndrome) [facility](HCC)    Past Surgical History  Procedure Laterality Date  . Loop recorder implant  September 2014  . Loop recorder implant N/A 04/05/2013    Procedure: LOOP RECORDER IMPLANT;  Surgeon: Gardiner RhymeJames D Keiran Gaffey, MD;  Location: [facility] CATH [facility];  Service: Cardiovascular;  Laterality: N/A;  . Ep implantable device N/A 08/23/2015    MDT Advisa Colton MRI compatible pacemaker implanted by Colton Bennett for sick sinus and syncope  . Ep implantable device N/A 08/23/2015    Procedure: Loop Recorder Removal;  Surgeon: Colton RangeJames Bennett Biss, MD;  [facility];  Service: Cardiovascular;  Laterality: N/A;    ROS- all systems are reviewed and negative except as per HPI above  Current Outpatient Prescriptions  Medication Sig Dispense Refill  . alfuzosin (UROXATRAL) 10 MG 24 hr tablet Take 10 mg by mouth daily with breakfast.     . cyanocobalamin 2000 MCG tablet Take 2,500 mcg by mouth daily.    . Multiple Vitamins-Minerals (MULTIVITAMIN PO) Take 1 tablet by mouth daily.    Marland Kitchen. omeprazole (PRILOSEC) 40 MG  capsule Take 40 mg by mouth daily.     No current facility-administered medications for this visit.    Physical Exam: Filed Vitals:   12/11/15 1059  BP: 120/60  Pulse: 72  Height: 6' (1.829 m)  Weight: 215 lb (97.523 kg)    GEN- The patient is well appearing, alert and oriented x 3 today.   Head- normocephalic, atraumatic Eyes-  Sclera clear, conjunctiva pink Ears- hearing intact Oropharynx- clear Lungs- Clear to ausculation bilaterally, normal work of breathing Chest- pacemaker pocket is well healed Heart- Regular rate and rhythm, no murmurs, rubs or gallops, PMI not laterally displaced GI- soft, NT, ND, + BS Extremities- no clubbing, cyanosis, or edema  Pacemaker interrogation- reviewed in detail today,  See PACEART report  ekg today reveals sinus rhythm 74 bpm with PVCs, otherwise normal ekg  Assessment and Plan:  1. Sick sinus syndrome Normal pacemaker function See Pace Art report No changes today  2. OSA Compliance with CPAP is encouraged  carelink Follow-up with Colton Colton Bennett as scheduled  Return to see EP NP in 1 year  Colton RangeJames Kari Montero MD, [facility] 12/11/2015 11:35 AM

## 2015-12-12 LAB — CUP PACEART INCLINIC DEVICE CHECK
Battery Remaining Longevity: 138 mo
Brady Statistic AP VP Percent: 0 %
Brady Statistic AP VS Percent: 0.98 %
Brady Statistic AS VS Percent: 98.98 %
Implantable Lead Location: 753859
Implantable Lead Model: 5076
Lead Channel Impedance Value: 437 Ohm
Lead Channel Impedance Value: 475 Ohm
Lead Channel Pacing Threshold Amplitude: 0.75 V
Lead Channel Sensing Intrinsic Amplitude: 14.25 mV
Lead Channel Sensing Intrinsic Amplitude: 2.125 mV
Lead Channel Sensing Intrinsic Amplitude: 2.125 mV
Lead Channel Setting Pacing Amplitude: 2 V
Lead Channel Setting Pacing Amplitude: 2.5 V
MDC IDC LEAD IMPLANT DT: 20170210
MDC IDC LEAD IMPLANT DT: 20170210
MDC IDC LEAD LOCATION: 753860
MDC IDC MSMT BATTERY VOLTAGE: 3.07 V
MDC IDC MSMT LEADCHNL RA IMPEDANCE VALUE: 323 Ohm
MDC IDC MSMT LEADCHNL RA PACING THRESHOLD AMPLITUDE: 1 V
MDC IDC MSMT LEADCHNL RA PACING THRESHOLD PULSEWIDTH: 0.4 ms
MDC IDC MSMT LEADCHNL RV IMPEDANCE VALUE: 570 Ohm
MDC IDC MSMT LEADCHNL RV PACING THRESHOLD PULSEWIDTH: 0.4 ms
MDC IDC MSMT LEADCHNL RV SENSING INTR AMPL: 18.375 mV
MDC IDC SESS DTM: 20170531151453
MDC IDC SET LEADCHNL RV PACING PULSEWIDTH: 0.4 ms
MDC IDC SET LEADCHNL RV SENSING SENSITIVITY: 2.8 mV
MDC IDC STAT BRADY AS VP PERCENT: 0.04 %
MDC IDC STAT BRADY RA PERCENT PACED: 0.99 %
MDC IDC STAT BRADY RV PERCENT PACED: 0.04 %

## 2016-01-30 ENCOUNTER — Encounter: Payer: Self-pay | Admitting: Neurology

## 2016-01-30 ENCOUNTER — Ambulatory Visit (INDEPENDENT_AMBULATORY_CARE_PROVIDER_SITE_OTHER): Payer: Medicare Other | Admitting: Neurology

## 2016-01-30 VITALS — BP 140/66 | HR 78 | Resp 16 | Ht 72.0 in | Wt 205.0 lb

## 2016-01-30 DIAGNOSIS — Z9989 Dependence on other enabling machines and devices: Principal | ICD-10-CM

## 2016-01-30 DIAGNOSIS — G4733 Obstructive sleep apnea (adult) (pediatric): Secondary | ICD-10-CM

## 2016-01-30 DIAGNOSIS — Z950 Presence of cardiac pacemaker: Secondary | ICD-10-CM

## 2016-01-30 NOTE — Patient Instructions (Addendum)
Please continue using your CPAP regularly. While your insurance requires that you use CPAP at least 4 hours each night on 70% of the nights, I recommend, that you not skip any nights and use it throughout the night if you can. Getting used to CPAP and staying with the treatment long term does take time and patience and discipline. Untreated obstructive sleep apnea when it is moderate to severe can have an adverse impact on cardiovascular health and raise her risk for heart disease, arrhythmias, hypertension, congestive heart failure, stroke and diabetes. Untreated obstructive sleep apnea causes sleep disruption, nonrestorative sleep, and sleep deprivation. This can have an impact on your day to day functioning and cause daytime sleepiness and impairment of cognitive function, memory loss, mood disturbance, and problems focussing. Using CPAP regularly can improve these symptoms.  Keep up the good work! I will see you back in 12 months for sleep apnea check up.   Your exam is stable.

## 2016-01-30 NOTE — Progress Notes (Signed)
Subjective:    Patient ID: Colton Bennett is a 82 y.o. male.  HPI     Interim history:   Colton Bennett is a very pleasant 82 year old right-handed woman with an underlying medical history of reflux disease, syncope, sick sinus syndrome, with recent status post pacemaker placement in February 2017 under Dr. Rayann Heman, and benign prostate hypertrophy who presents for follow up consultation of his OSA, on CPAP therapy. The patient is unaccompanied today. I last saw him on 01/30/2015, at which time he reported doing well. He had no recent syncopal spell. He was compliant with CPAP therapy and I suggested a one-year checkup.  Today, 01/30/2016: I reviewed his CPAP compliance data from 11/27/2015 through 12/26/2015 which is a total of 30 days during which time he used his machine every night with percent used days greater than 4 hours at 97%, indicating excellent compliance with an average usage for all nights of 7 hours and 26 minutes, residual AHI 3.8 per hour, leak acceptable with the 95th percentile at 12.8 L/m on a pressure of 8 cm.   Today, 01/30/2016: He reports Doing well, is compliant with treatment, pacemaker does not bother him. No recent presyncopal or syncopal spell. Stays active, enjoys yard work and fishing. Recently received CPAP supplies. He had a pacemaker placed for sick sinus syndrome on 08/23/2015 and his implantable loop recorder was removed at the time. He had a recent follow-up with Dr. Rayann Heman on 12/11/2015 and I reviewed the note. He has been doing well and a one-year checkup was recommended.   Previously:   I saw him on 01/15/2014, at which time he reported using CPAP faithfully. He had no problems with using it. He had a loop recorder in place. He denied any recent palpitations, dizziness, chest pain, shortness of breath, or lightheadedness.  I reviewed his CPAP compliance data from 12/31/2014 through 01/29/2015 which is a total of 30 days during which time he used his machine every  night with percent used days greater than 4 hours at 97%, indicating excellent compliance with an average usage of 6 hours and 54 minutes, residual AHI at 2.8 per hour, leak at 2.8 L/m for the 95th percentile which is great, setting of 8 cm with no EPR.  I saw him on 07/17/2013, at which time he reported nausea and an upset stomach (after eating a cheeseburger and fries from [facility] the night prior). Her reported mild, intermittent dizziness, no vertigo. I also reviewed his compliance data from 06/17/2013 through 07/16/2013 which is a total of 30 days during which time he was 100% compliant with treatment with a percent used days greater than 4 hours of 100%, indicating excellent compliance. His residual AHI was 2.5 per hour indicating an appropriate treatment pressure of 8 cm with EPR of 2. His average usage was 7 hours and 59 minutes.   I reviewed his compliance data from 11/24/2013 through 12/23/2013 which is a total of 30 days during which time he used his CPAP every night, percent used days greater than 4 hours was 93%, indicating excellent compliance, average usage was 6 hours and 35 minutes, residual AHI low at 1.3 per hour and leak acceptable at 19.1 L per minute for the 95th percentile, pressure at 8 cm with EPR of 2.  I saw him on 05/18/2013 for followup of his syncope and collapse as well as after his sleep study. At the time, we discussed all his test results including blood work (ANA, CRP, CBC, CK, hemoglobin A1c, CMP,  serum electrophoresis, RF, RPR, ESR, TSH), echocardiogram, EEG, cardiac event monitor, and MRA neck and head and MRI of brain as well as his sleep study. His labs were essentially unremarkable, with the exception of his hemoglobin A1c at 5.8. His TSH was normal. His MRI brain showed white matter changes, and repeat MRI brain with contrast showed no abnormal contrast enhancement. His MRA neck and head were unremarkable. His EEG was unremarkable. He had a baseline sleep study on  02/08/2013 which demonstrated a total AHI of 12 per hour, a severe REM related OSA with a REM-related AHI of 48 per hour and an oxyhemoglobin desaturation nadir of 83%. Based on those results I asked him to return for CPAP titration study which he had on 03/21/2013, his CPAP was titrated from 5-9 cm with almost complete resolution of his sleep disordered breathing on a pressure of 8 cm. He was placed on CPAP at a pressure of 8 cm with large nasal pillows.   I reviewed his compliance data from 04/17/2013 through 05/17/2013 which is a total of 31 days during which time he used CPAP every day. His percent used days greater than 4 hours was 100% indicating excellent compliance. His average usage was 7 hours and 24 minutes. His leak was not very high. His residual AHI was 2.2 per hour indicating an appropriate treatment pressure of 8 cm of water with EPR of 2.   He had a 30 day Holter monitor which showed no sustained irregularities but some PVCs. Since then, in late September he had a implantable loop recorder placed.   Has been experiencing periods of dizziness and blurry vision as well as nausea and vomiting for the past several years, probably at least 2 years. On 01/12/13 he had parked his car in a parking lot and was on his way to the store and collapsed. He had no warning signs, no palpitations, and claims he had no LOC, no HA, SOB. Some 2 years ago, he had an episode of L facial weakness. No hx of staring spells, no twitching, no shaking, no automatism, except occasional lip smacking while driving or while eating.   He presented to the emergency room with midsternal chest pain on 06/01/2013 and was admitted for one day with negative cardiac workup. He was scheduled for an outpatient stress test, which he had on 06/07/2013 and it was negative. He has been seen by cardiology outpatient and needs no further w/u from their end at this time and had a loop recorder placed.    His Past Medical History Is  Significant For: Past Medical History  Diagnosis Date  . Syncope, near   . History of echocardiogram     Echocardiogram (01/2013): EF of 50-55%, normal wall motion, trivial AI, mild MR, mild to moderate LAE  . History of cardiovascular stress test     ETT-Myoview (06/07/13): No ischemia, EF 56%; normal study.  . DOE (dyspnea on exertion)   . Chest pain   . Dizziness   . Presence of permanent cardiac pacemaker   . OSA on CPAP   . GERD (gastroesophageal reflux disease)   . SSS (sick sinus syndrome) ([place])     His Past Surgical History Is Significant For: Past Surgical History  Procedure Laterality Date  . Loop recorder implant  September 2014  . Loop recorder implant N/A 04/05/2013    Procedure: LOOP RECORDER IMPLANT;  Surgeon: Coralyn Mark, MD;  Location: [facility];  Service: Cardiovascular;  Laterality: N/A;  .  Ep implantable device N/A 08/23/2015    MDT Advisa DR MRI compatible pacemaker implanted by Dr Rayann Heman for sick sinus and syncope  . Ep implantable device N/A 08/23/2015    Procedure: Loop Recorder Removal;  Surgeon: Thompson Grayer, MD;  Location: [facility];  Service: Cardiovascular;  Laterality: N/A;    His Family History Is Significant For: Family History  Problem Relation Age of Onset  . CAD Neg Hx     His Social History Is Significant For: Social History   Social History  . Marital Status: Married    Spouse Name: N/A  . Number of Children: 1  . Years of Education: N/A   Occupational History  . 'retired    Social History Main Topics  . Smoking status: Former Smoker -- 3.00 packs/day for 30 years    Types: Cigarettes    Quit date: 02/01/1982  . Smokeless tobacco: Never Used  . Alcohol Use: No  . Drug Use: No  . Sexual Activity: Not Asked   Other Topics Concern  . None   Social History Narrative    His Allergies Are:  No Known Allergies:   His Current Medications Are:  Outpatient Encounter Prescriptions as of 01/30/2016  Medication Sig   . alfuzosin (UROXATRAL) 10 MG 24 hr tablet Take 10 mg by mouth daily with breakfast.   . aspirin 81 MG tablet Take 81 mg by mouth daily.  . cyanocobalamin 2000 MCG tablet Take 2,500 mcg by mouth daily.  . Multiple Vitamins-Minerals (MULTIVITAMIN PO) Take 1 tablet by mouth daily.  Marland Kitchen omeprazole (PRILOSEC) 40 MG capsule Take 40 mg by mouth daily.   No facility-administered encounter medications on file as of 01/30/2016.  :  Review of Systems:  Out of a complete 14 point review of systems, all are reviewed and negative with the exception of these symptoms as listed below:   Review of Systems  Neurological:       Patient is here for f/u. States that he is doing well on CPAP. Asks if he takes a trip can he leave his CPAP at home?     Objective:  Neurologic Exam  Physical Exam Physical Examination:   Filed Vitals:   01/30/16 1301  BP: 140/66  Pulse: 78  Resp: 16    General Examination: The patient is a very pleasant 82 y.o. male in no acute distress. He appears well-developed and well-nourished and well groomed.   HEENT: Normocephalic, atraumatic, pupils are equal, round and reactive to light and accommodation. Extraocular tracking is good without limitation to gaze excursion or nystagmus noted. Normal smooth pursuit is noted. Hearing is grossly intact. Face is slight asymmetric, unchanged. Speech is clear with no dysarthria noted. There is no hypophonia. There is no lip, neck/head, jaw or voice tremor. Neck is supple with full range of passive and active motion. There are no carotid bruits on auscultation. Oropharynx exam reveals: mild mouth dryness, adequate dental hygiene and moderate airway crowding, due to elongated uvula and redundant soft palate. Mallampati is class II. Tongue protrudes centrally and palate elevates symmetrically.   Chest: Clear to auscultation without wheezing, rhonchi or crackles noted.  Heart: S1+S2+0, regular and normal without murmurs, rubs or gallops noted.    Abdomen: Soft, non-tender and non-distended with normal bowel sounds appreciated on auscultation.  Extremities: There is no pitting edema in the distal lower extremities bilaterally. Pedal pulses are intact.  Skin: Warm and dry without trophic changes noted. There are no varicose veins. He has  vitiligo.   Musculoskeletal: exam reveals no obvious joint deformities, tenderness or joint swelling or erythema.   Neurologically:  Mental status: The patient is awake, alert and oriented in all 4 spheres. His memory, attention, language and knowledge are appropriate. There is no aphasia, agnosia, apraxia or anomia. Speech is clear with normal prosody and enunciation. Thought process is linear. Mood is congruent and affect is normal.  Cranial nerves are as described above under HEENT exam. In addition, shoulder shrug is normal with equal shoulder height noted. Motor exam: Normal bulk, strength and tone is noted. There is no drift, tremor or rebound. Romberg is negative. Reflexes are 1+ throughout. Fine motor skills are intact. Cerebellar testing shows no dysmetria or intention tremor on finger to nose testing. There is no truncal or gait ataxia. Sensory exam is intact to light touch in the UEs and LEs.  Gait, station and balance: he stands without difficulty. No veering to one side is noted. No leaning to one side is noted. No dizziness reported. Posture is age-appropriate and stance is narrow based. No problems turning are noted. Tandem walk somwhat challenging, but in keeping with age.   Assessment and Plan:   In summary, Khalfani Weideman is a very pleasant 82 year old male with and history of dizziness, and syncope in 2014, who presents for follow-up consultation of his obstructive sleep apnea, well established on treatment with CPAP. For his syncopal spell he had workup from my end of things in the form of brain imaging, EEG and sleep study. He dad an implantable loop monitor and this was eventually removed  and a pacemaker was placed. He is doing well. He is compliant with CPAP therapy. Exam is stable. He has received his CPAP supplies recently. I suggested one year checkup, sooner as needed. I answered all his questions today and he was in agreement. I spent 15 minutes in total face-to-face time with the patient, more than 50% of which was spent in counseling and coordination of care, reviewing test results, reviewing medication and discussing or reviewing the diagnosis of OSA and syncope, the prognosis and treatment options.

## 2016-03-05 ENCOUNTER — Encounter: Payer: Self-pay | Admitting: Cardiovascular Disease

## 2016-03-11 ENCOUNTER — Ambulatory Visit (INDEPENDENT_AMBULATORY_CARE_PROVIDER_SITE_OTHER): Payer: Medicare Other | Admitting: *Deleted

## 2016-03-11 DIAGNOSIS — I495 Sick sinus syndrome: Secondary | ICD-10-CM

## 2016-03-11 NOTE — Progress Notes (Signed)
Remote pacemaker transmission.   

## 2016-03-13 ENCOUNTER — Encounter: Payer: Self-pay | Admitting: Cardiology

## 2016-03-18 LAB — CUP PACEART REMOTE DEVICE CHECK
Brady Statistic AP VP Percent: 0.01 %
Brady Statistic RA Percent Paced: 2.8 %
Brady Statistic RV Percent Paced: 0.05 %
Date Time Interrogation Session: 20170830144432
Implantable Lead Implant Date: 20170210
Implantable Lead Location: 753860
Implantable Lead Model: 5076
Lead Channel Impedance Value: 437 Ohm
Lead Channel Impedance Value: 570 Ohm
Lead Channel Pacing Threshold Pulse Width: 0.4 ms
Lead Channel Sensing Intrinsic Amplitude: 1.625 mV
Lead Channel Sensing Intrinsic Amplitude: 14.5 mV
Lead Channel Setting Pacing Amplitude: 2 V
Lead Channel Setting Pacing Amplitude: 2.5 V
Lead Channel Setting Pacing Pulse Width: 0.4 ms
Lead Channel Setting Sensing Sensitivity: 2.8 mV
MDC IDC LEAD IMPLANT DT: 20170210
MDC IDC LEAD LOCATION: 753859
MDC IDC MSMT BATTERY REMAINING LONGEVITY: 127 mo
MDC IDC MSMT BATTERY VOLTAGE: 3.04 V
MDC IDC MSMT LEADCHNL RA IMPEDANCE VALUE: 342 Ohm
MDC IDC MSMT LEADCHNL RA IMPEDANCE VALUE: 475 Ohm
MDC IDC MSMT LEADCHNL RA PACING THRESHOLD AMPLITUDE: 1 V
MDC IDC MSMT LEADCHNL RA SENSING INTR AMPL: 1.625 mV
MDC IDC MSMT LEADCHNL RV PACING THRESHOLD AMPLITUDE: 0.75 V
MDC IDC MSMT LEADCHNL RV PACING THRESHOLD PULSEWIDTH: 0.4 ms
MDC IDC MSMT LEADCHNL RV SENSING INTR AMPL: 14.5 mV
MDC IDC STAT BRADY AP VS PERCENT: 2.79 %
MDC IDC STAT BRADY AS VP PERCENT: 0.04 %
MDC IDC STAT BRADY AS VS PERCENT: 97.17 %

## 2016-03-19 ENCOUNTER — Ambulatory Visit (INDEPENDENT_AMBULATORY_CARE_PROVIDER_SITE_OTHER): Payer: Medicare Other | Admitting: Cardiovascular Disease

## 2016-03-19 ENCOUNTER — Encounter: Payer: Self-pay | Admitting: Cardiovascular Disease

## 2016-03-19 VITALS — BP 120/60 | HR 72 | Ht 72.0 in | Wt 211.0 lb

## 2016-03-19 DIAGNOSIS — I495 Sick sinus syndrome: Secondary | ICD-10-CM

## 2016-03-19 NOTE — Progress Notes (Signed)
Colton Bennett Date of Birth  1934-05-20       [facility]    [place] [address], [address]  [address], [address] [place], [place]  [address]   [place], [place]  [address] [phone]     [phone]   Fax  [phone]    Fax [phone]  Problem List: 1. TIA 2. Dyspnea   History of Present Illness:  Colton Bennett is a 82 yo with a recent episode of collapse.  He was seen with his wife who provided most of the accurate history - he seems to play his symptoms down quite a bit.    He got out of his truck and collapsed in the parking lot.  He was talking and remained conscious the whole time.   He had severe nausea and vomitting after that.   He remained unsteady and was not able to walk for several hours.  He did not want to go to the doctor or ER.    He felt better after several days.  He were 30 day event monitor. He was found to have normal sinus rhythm. He had occasional episodes of frequent premature ventricular contractions-occasionally in a bigeminal pattern.  He did not have any symptoms while wearing the monitor.  He had an echocardiogram which revealed normal left ventricle systolic function with an ejection fraction of 50-55%. He had normal wall motion. He had trivial aortic insufficiency, mild mitral regurgitation. The left atrium was mildly to moderately dilated.  His EEG was normal.  He's had 2 different sleep studies. The first study was July 31 which revealed severe sleep apnea during REM sleep. He had a second sleep study the results of which are not known at this time.  His MRI was normal. The head MRA is normal.  September 27, 2013:  Colton Bennett has had an implantable loop recorder placed.   He's had a few episodes of presyncope.  He exercises on occasion. He denies any chest pain or shortness breath.  He was found to have a 3 second pause with his ILR.    Sept. 16,2015:  Colton Bennett has done well.  No further syncopal episodes.   Still has  the implantable loop.    Has been busy working in his garden this summer.   Sept. 19 , 2016: Had an episode of dizziness 2 weeks ago .  No CP   Sept. 7, 2017:  Colton Bennett is seen today for follow up visit No further passing out episodes.  No CP or dyspnea    Current Outpatient Prescriptions on File Prior to Visit  Medication Sig Dispense Refill  . alfuzosin (UROXATRAL) 10 MG 24 hr tablet Take 10 mg by mouth daily with breakfast.     . aspirin 81 MG tablet Take 81 mg by mouth daily.    . cyanocobalamin 2000 MCG tablet Take 2,500 mcg by mouth daily.    . Multiple Vitamins-Minerals (MULTIVITAMIN PO) Take 1 tablet by mouth daily.    Marland Kitchen omeprazole (PRILOSEC) 40 MG capsule Take 40 mg by mouth daily.     No current facility-administered medications on file prior to visit.     No Known Allergies  Past Medical History:  Diagnosis Date  . Chest pain   . Dizziness   . DOE (dyspnea on exertion)   . GERD (gastroesophageal reflux disease)   . History of cardiovascular stress test    ETT-Myoview (06/07/13): No ischemia, EF 56%; normal study.  Marland Kitchen  History of echocardiogram    Echocardiogram (01/2013): EF of 50-55%, normal wall motion, trivial AI, mild MR, mild to moderate LAE  . OSA on CPAP   . Presence of permanent cardiac pacemaker   . SSS (sick sinus syndrome) ([facility])   . Syncope, near     Past Surgical History:  Procedure Laterality Date  . EP IMPLANTABLE DEVICE N/A 08/23/2015   MDT Advisa DR MRI compatible pacemaker implanted by Dr Johney FrameAllred for sick sinus and syncope  . EP IMPLANTABLE DEVICE N/A 08/23/2015   Procedure: Loop Recorder Removal;  Surgeon: Hillis RangeJames Allred, MD;  [facility];  Service: Cardiovascular;  Laterality: N/A;  . LOOP RECORDER IMPLANT  September 2014  . LOOP RECORDER IMPLANT N/A 04/05/2013   Procedure: LOOP RECORDER IMPLANT;  Surgeon: Gardiner RhymeJames D Allred, MD;  Location: [facility];  Service: Cardiovascular;  Laterality: N/A;    History  Smoking Status  . Former  Smoker  . Packs/day: 3.00  . Years: 30.00  . Types: Cigarettes  . Quit date: 02/01/1982  Smokeless Tobacco  . Never Used    History  Alcohol Use No    Family History  Problem Relation Age of Onset  . CAD Neg Hx     Reviw of Systems:  Reviewed in the HPI.  All other systems are negative.  Physical Exam: Blood pressure 120/60, pulse 72, height 6' (1.829 m), weight 211 lb (95.7 kg). General: Well developed, well nourished, in no acute distress.  Head: Normocephalic, atraumatic, sclera non-icteric, mucus membranes are moist,   Neck: Supple. Carotids are 2 + without bruits. No JVD   Lungs: Clear   Heart: RR, normal S1, S2, occasional PVCs  Abdomen: Soft, non-tender, non-distended with normal bowel sounds.  Msk:  Strength and tone are normal   Extremities: No clubbing or cyanosis. No edema.  Distal pedal pulses are 2+ and equal    Neuro: CN II - XII intact.  Alert and oriented X 3.   Psych:  Normal   ECG: NSR at 72.   Normal.    Assessment / Plan:   1. Near-syncopy - s/p pacer    Doing well.  No further episodes of syncope Pacer is followed by EP  No CP or dyspnea   Will see I 1 year.      Kristeen MissPhilip Zyron Deeley, MD  03/19/2016 11:57 AM    [facility] [address],  [address] [place], [place]  [address] Pager [phone] Phone: [phone]; Fax: [phone]

## 2016-03-19 NOTE — Patient Instructions (Signed)
Medication Instructions:  Your physician recommends that you continue on your current medications as directed. Please refer to the Current Medication list given to you today.   Labwork: None Ordered   Testing/Procedures: None Ordered   Follow-Up: Your physician wants you to follow-up in: 1 year with Dr. Nahser.  You will receive a reminder letter in the mail two months in advance. If you don't receive a letter, please call our office to schedule the follow-up appointment.   If you need a refill on your cardiac medications before your next appointment, please call your pharmacy.   Thank you for choosing [facility]! Deforrest Bogle, RN [phone]    

## 2016-03-27 ENCOUNTER — Encounter: Payer: Self-pay | Admitting: Cardiology

## 2016-06-10 ENCOUNTER — Ambulatory Visit (INDEPENDENT_AMBULATORY_CARE_PROVIDER_SITE_OTHER): Payer: Medicare Other | Admitting: *Deleted

## 2016-06-10 DIAGNOSIS — I495 Sick sinus syndrome: Secondary | ICD-10-CM | POA: Diagnosis not present

## 2016-06-10 NOTE — Progress Notes (Signed)
Remote pacemaker transmission.   

## 2016-06-19 ENCOUNTER — Encounter: Payer: Self-pay | Admitting: Cardiology

## 2016-07-14 LAB — CUP PACEART REMOTE DEVICE CHECK
Battery Voltage: 3.03 V
Brady Statistic AP VP Percent: 0.01 %
Brady Statistic RA Percent Paced: 1.24 %
Brady Statistic RV Percent Paced: 0.04 %
Implantable Lead Implant Date: 20170210
Implantable Lead Model: 5076
Implantable Pulse Generator Implant Date: 20170210
Lead Channel Impedance Value: 456 Ohm
Lead Channel Impedance Value: 494 Ohm
Lead Channel Impedance Value: 570 Ohm
Lead Channel Pacing Threshold Amplitude: 0.5 V
Lead Channel Pacing Threshold Amplitude: 0.875 V
Lead Channel Pacing Threshold Pulse Width: 0.4 ms
Lead Channel Setting Pacing Amplitude: 2 V
Lead Channel Setting Sensing Sensitivity: 2.8 mV
MDC IDC LEAD IMPLANT DT: 20170210
MDC IDC LEAD LOCATION: 753859
MDC IDC LEAD LOCATION: 753860
MDC IDC MSMT BATTERY REMAINING LONGEVITY: 120 mo
MDC IDC MSMT LEADCHNL RA IMPEDANCE VALUE: 342 Ohm
MDC IDC MSMT LEADCHNL RA SENSING INTR AMPL: 1.5 mV
MDC IDC MSMT LEADCHNL RA SENSING INTR AMPL: 1.5 mV
MDC IDC MSMT LEADCHNL RV PACING THRESHOLD PULSEWIDTH: 0.4 ms
MDC IDC MSMT LEADCHNL RV SENSING INTR AMPL: 16.125 mV
MDC IDC MSMT LEADCHNL RV SENSING INTR AMPL: 16.125 mV
MDC IDC SESS DTM: 20171129153031
MDC IDC SET LEADCHNL RV PACING AMPLITUDE: 2.5 V
MDC IDC SET LEADCHNL RV PACING PULSEWIDTH: 0.4 ms
MDC IDC STAT BRADY AP VS PERCENT: 1.23 %
MDC IDC STAT BRADY AS VP PERCENT: 0.04 %
MDC IDC STAT BRADY AS VS PERCENT: 98.73 %

## 2016-09-08 ENCOUNTER — Encounter: Payer: Self-pay | Admitting: Internal Medicine

## 2016-09-09 ENCOUNTER — Ambulatory Visit (INDEPENDENT_AMBULATORY_CARE_PROVIDER_SITE_OTHER): Payer: Medicare Other | Admitting: *Deleted

## 2016-09-09 DIAGNOSIS — I495 Sick sinus syndrome: Secondary | ICD-10-CM

## 2016-09-09 NOTE — Progress Notes (Signed)
Remote pacemaker transmission.   

## 2016-09-10 ENCOUNTER — Encounter: Payer: Self-pay | Admitting: Cardiology

## 2016-09-11 LAB — CUP PACEART REMOTE DEVICE CHECK
Battery Remaining Longevity: 114 mo
Brady Statistic AP VS Percent: 0.66 %
Brady Statistic AS VP Percent: 0.04 %
Brady Statistic RA Percent Paced: 0.66 %
Implantable Lead Implant Date: 20170210
Implantable Lead Implant Date: 20170210
Implantable Lead Location: 753859
Implantable Lead Location: 753860
Lead Channel Impedance Value: 323 Ohm
Lead Channel Pacing Threshold Amplitude: 0.75 V
Lead Channel Pacing Threshold Pulse Width: 0.4 ms
Lead Channel Pacing Threshold Pulse Width: 0.4 ms
Lead Channel Sensing Intrinsic Amplitude: 0.75 mV
Lead Channel Sensing Intrinsic Amplitude: 0.75 mV
Lead Channel Sensing Intrinsic Amplitude: 15.25 mV
Lead Channel Setting Pacing Pulse Width: 0.4 ms
MDC IDC MSMT BATTERY VOLTAGE: 3.02 V
MDC IDC MSMT LEADCHNL RA IMPEDANCE VALUE: 380 Ohm
MDC IDC MSMT LEADCHNL RA PACING THRESHOLD AMPLITUDE: 0.625 V
MDC IDC MSMT LEADCHNL RV IMPEDANCE VALUE: 437 Ohm
MDC IDC MSMT LEADCHNL RV IMPEDANCE VALUE: 570 Ohm
MDC IDC MSMT LEADCHNL RV SENSING INTR AMPL: 15.25 mV
MDC IDC PG IMPLANT DT: 20170210
MDC IDC SESS DTM: 20180228151859
MDC IDC SET LEADCHNL RA PACING AMPLITUDE: 2 V
MDC IDC SET LEADCHNL RV PACING AMPLITUDE: 2.5 V
MDC IDC SET LEADCHNL RV SENSING SENSITIVITY: 2.8 mV
MDC IDC STAT BRADY AP VP PERCENT: 0 %
MDC IDC STAT BRADY AS VS PERCENT: 99.3 %
MDC IDC STAT BRADY RV PERCENT PACED: 0.04 %

## 2016-09-28 IMAGING — CR DG CHEST 2V
2 series · 2 of 2 positions shown · non-contrast
Comparison: 06/01/2013

CLINICAL DATA: Cardiac device in place

EXAM:
CHEST  2 VIEW

[chest pa]
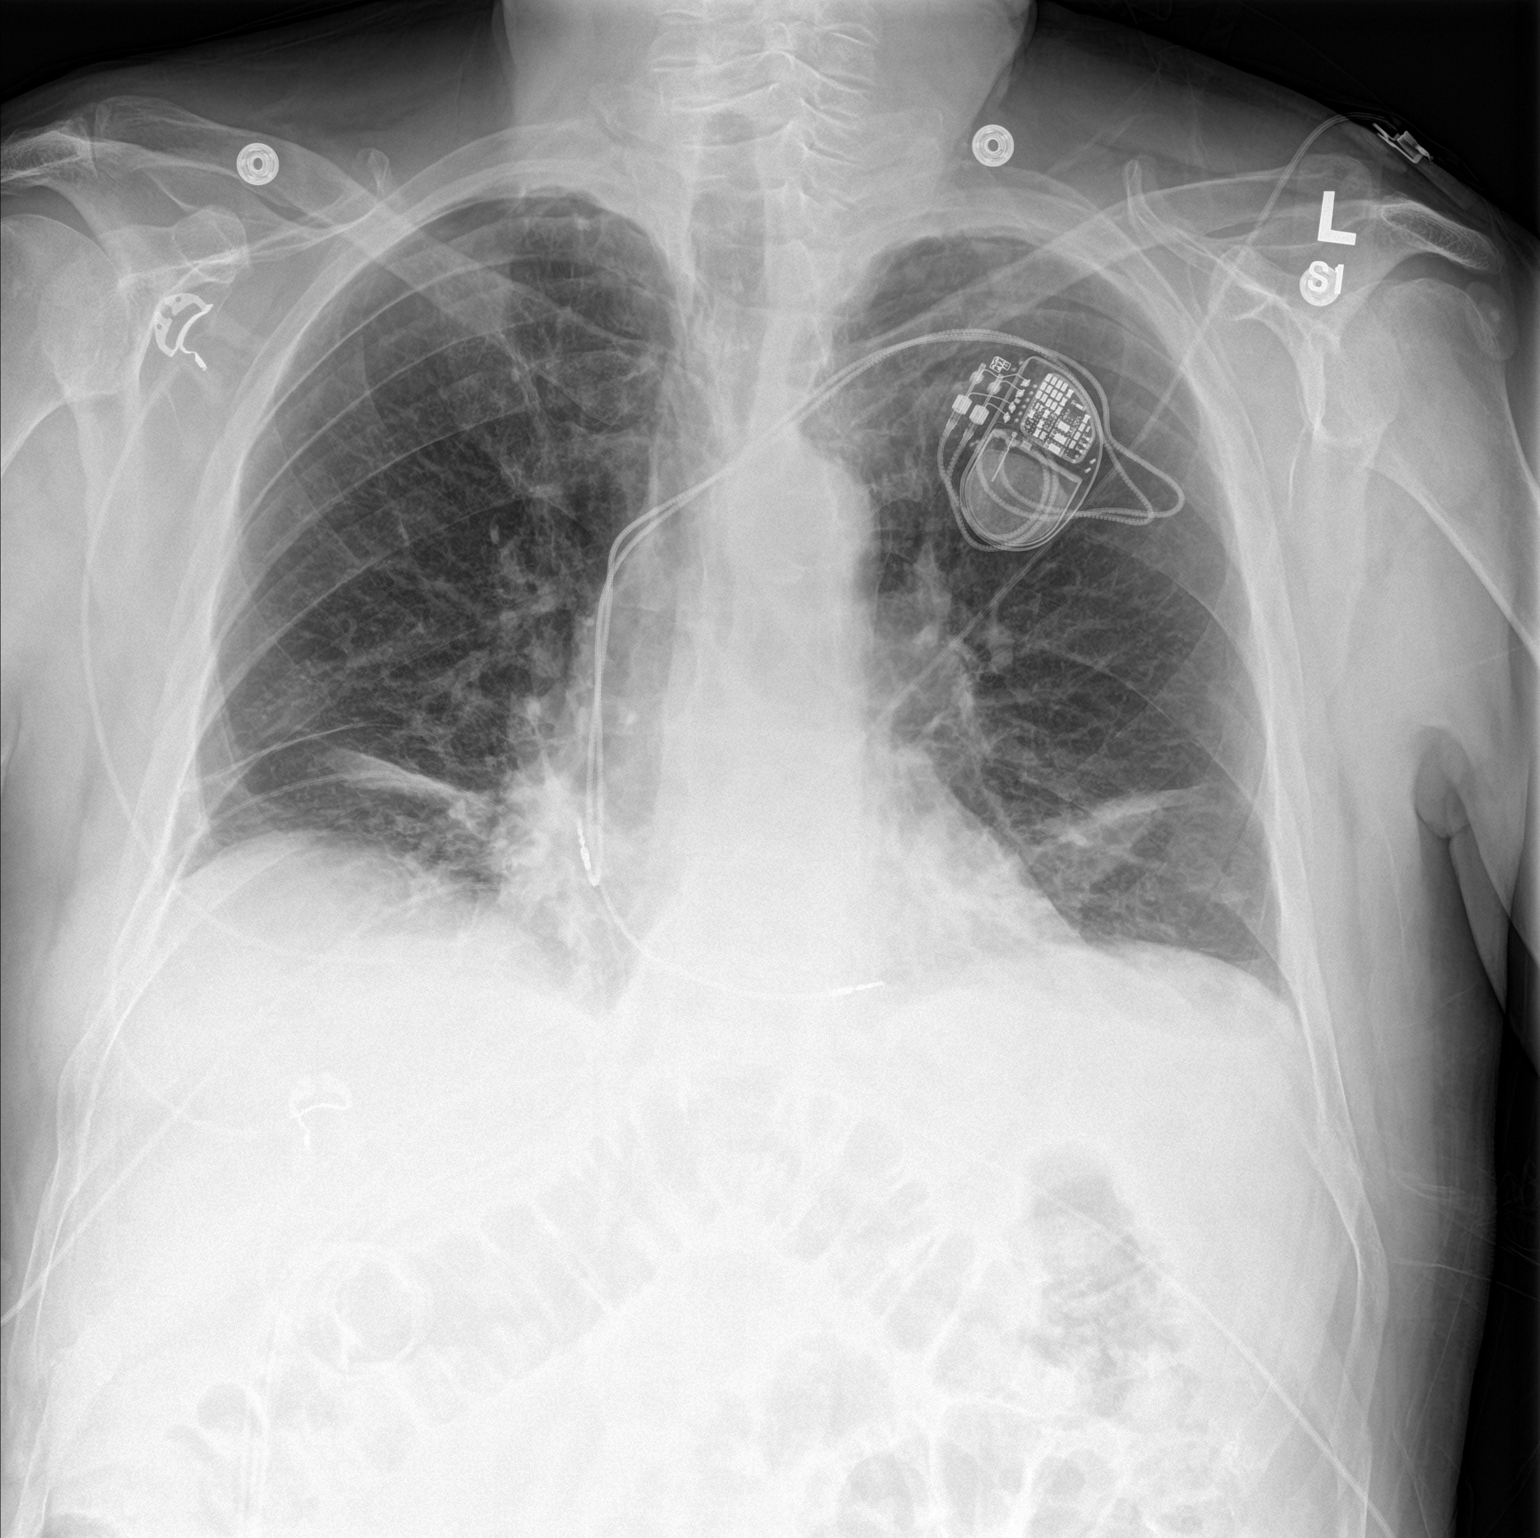

[chest lat]
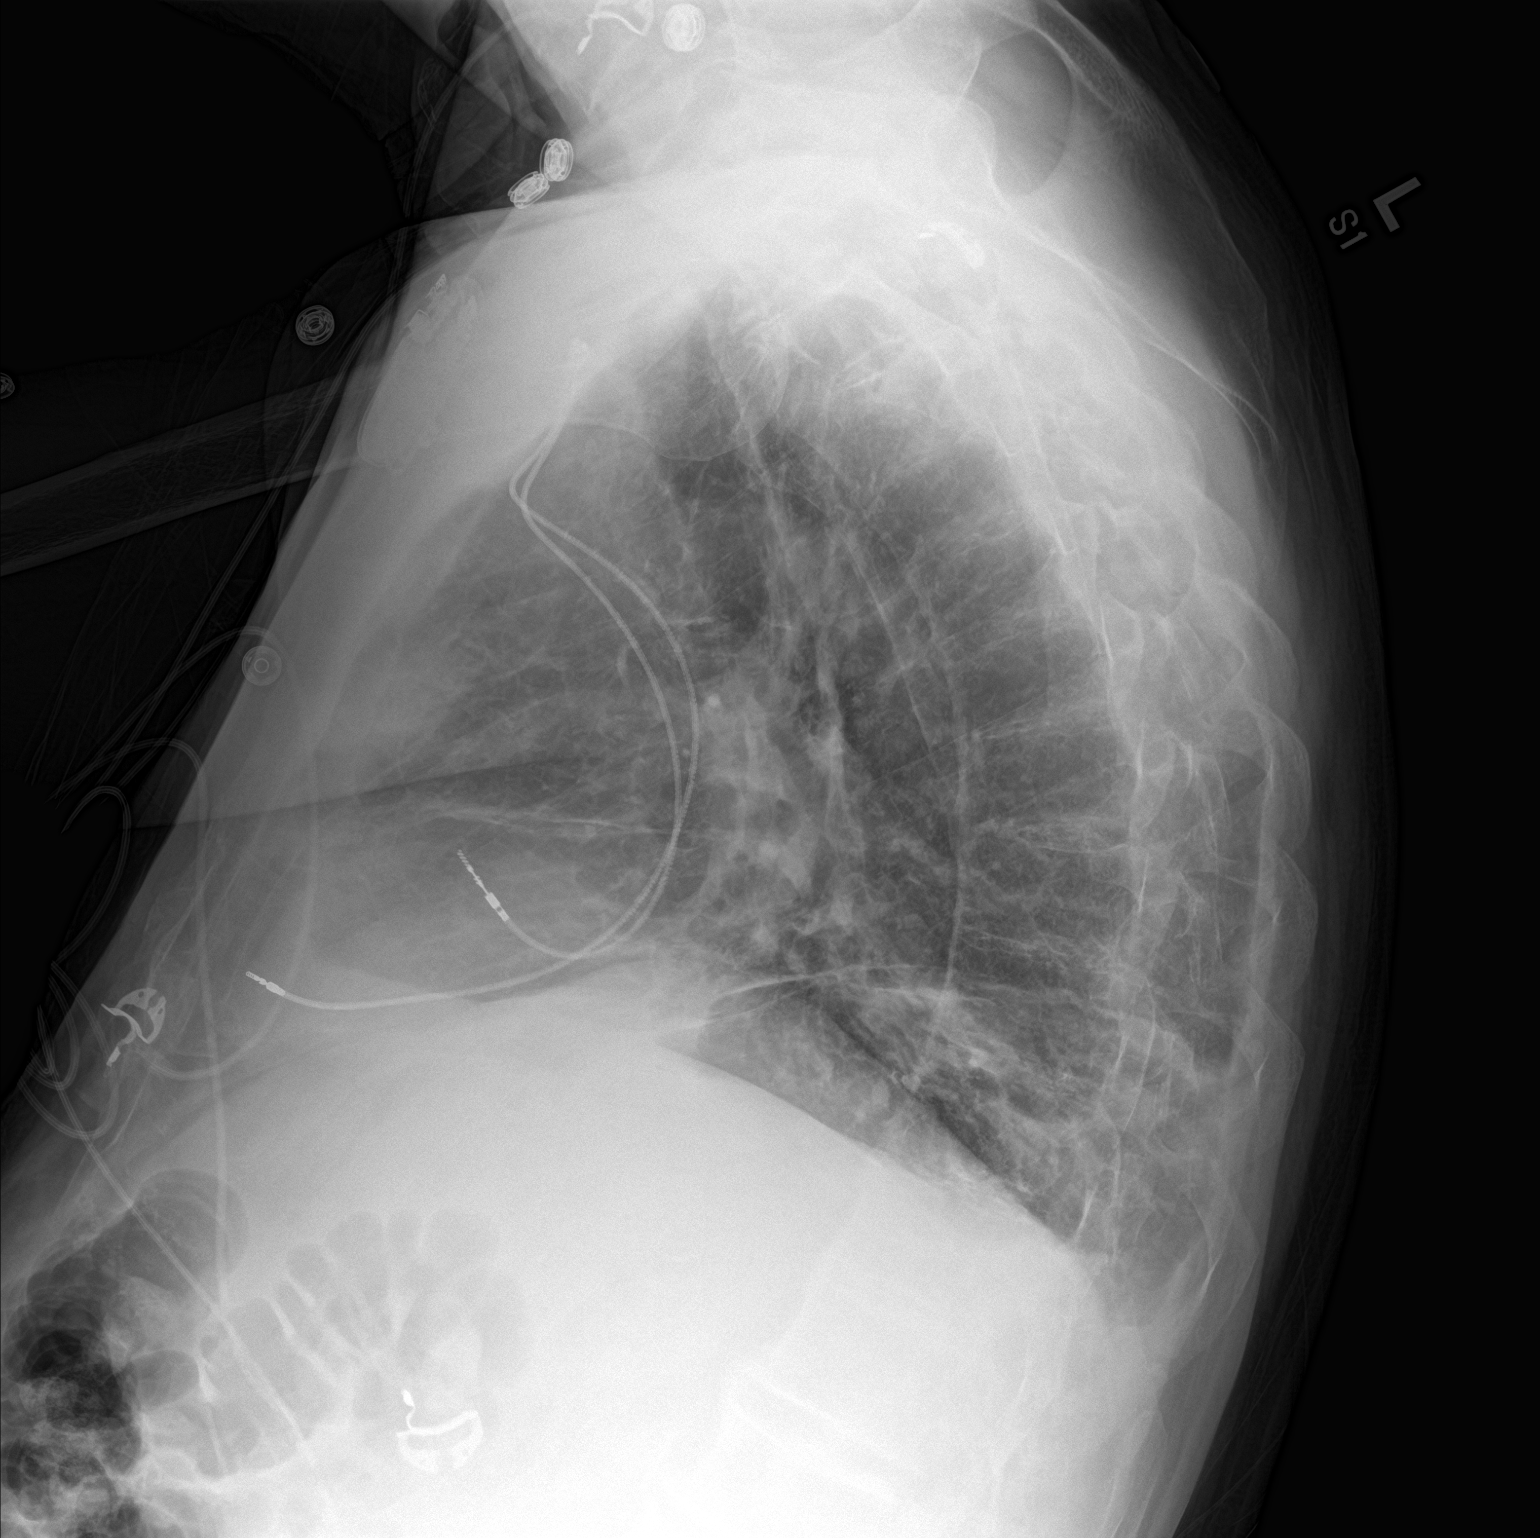

[2 of 2 positions shown; findings below may reference images not displayed]

FINDINGS: New dual-chamber pacer on the left with leads in unremarkable
position. Over the right atrial appendage and right ventricle. No
pneumothorax, mediastinal widening, or new cardiomegaly.

Increased bibasilar atelectasis.

Thoracic ankylosis with the appearance of syndesmophytes, suggesting
ankylosing spondylitis.
IMPRESSION: 1. No acute finding after pacer insertion.
2. Bibasilar atelectasis.

## 2016-12-14 ENCOUNTER — Encounter: Payer: Self-pay | Admitting: Internal Medicine

## 2016-12-14 ENCOUNTER — Ambulatory Visit (INDEPENDENT_AMBULATORY_CARE_PROVIDER_SITE_OTHER): Payer: Medicare Other | Admitting: Internal Medicine

## 2016-12-14 VITALS — BP 118/61 | HR 78 | Ht 72.0 in | Wt 212.2 lb

## 2016-12-14 DIAGNOSIS — I495 Sick sinus syndrome: Secondary | ICD-10-CM

## 2016-12-14 DIAGNOSIS — G4733 Obstructive sleep apnea (adult) (pediatric): Secondary | ICD-10-CM

## 2016-12-14 LAB — CUP PACEART INCLINIC DEVICE CHECK
Brady Statistic AS VP Percent: 0.04 %
Brady Statistic RA Percent Paced: 1.48 %
Brady Statistic RV Percent Paced: 0.04 %
Date Time Interrogation Session: 20180604142539
Implantable Lead Implant Date: 20170210
Implantable Lead Location: 753859
Implantable Lead Model: 5076
Lead Channel Impedance Value: 456 Ohm
Lead Channel Impedance Value: 475 Ohm
Lead Channel Pacing Threshold Amplitude: 0.75 V
Lead Channel Pacing Threshold Pulse Width: 0.4 ms
Lead Channel Setting Pacing Amplitude: 2.5 V
Lead Channel Setting Sensing Sensitivity: 2.8 mV
MDC IDC LEAD IMPLANT DT: 20170210
MDC IDC LEAD LOCATION: 753860
MDC IDC MSMT BATTERY REMAINING LONGEVITY: 109 mo
MDC IDC MSMT BATTERY VOLTAGE: 3.02 V
MDC IDC MSMT LEADCHNL RA IMPEDANCE VALUE: 342 Ohm
MDC IDC MSMT LEADCHNL RA SENSING INTR AMPL: 1.75 mV
MDC IDC MSMT LEADCHNL RV IMPEDANCE VALUE: 570 Ohm
MDC IDC MSMT LEADCHNL RV PACING THRESHOLD AMPLITUDE: 0.5 V
MDC IDC MSMT LEADCHNL RV PACING THRESHOLD PULSEWIDTH: 0.4 ms
MDC IDC MSMT LEADCHNL RV SENSING INTR AMPL: 15 mV
MDC IDC PG IMPLANT DT: 20170210
MDC IDC SET LEADCHNL RA PACING AMPLITUDE: 2 V
MDC IDC SET LEADCHNL RV PACING PULSEWIDTH: 0.4 ms
MDC IDC STAT BRADY AP VP PERCENT: 0.01 %
MDC IDC STAT BRADY AP VS PERCENT: 1.47 %
MDC IDC STAT BRADY AS VS PERCENT: 98.48 %

## 2016-12-14 NOTE — Patient Instructions (Signed)
Medication Instructions:  Your physician recommends that you continue on your current medications as directed. Please refer to the Current Medication list given to you today.   Labwork: None ordered   Testing/Procedures: None ordered   Follow-Up: Your physician wants you to follow-up in: 12 months with Gypsy BalsamAmber Seiler, NP You will receive a reminder letter in the mail two months in advance. If you don't receive a letter, please call our office to schedule the follow-up appointment.   Remote monitoring is used to monitor your Pacemaker from home. This monitoring reduces the number of office visits required to check your device to one time per year. It allows us to keep an eye on the functioning of your device to ensure it is working properly. You are scheduled for a device check from home on 03/15/17. You may send your transmission at any time that day. If you have a wireless device, the transmission will be sent automatically. After your physician reviews your transmission, you will receive a postcard with your next transmission date.    Any Other Special Instructions Will Be Listed Below (If Applicable).     If you need a refill on your cardiac medications before your next appointment, please call your pharmacy.

## 2016-12-14 NOTE — Progress Notes (Signed)
PCP: Joette CatchingNyland, Leonard, MD Primary Cardiologist:  Dr Scherrie GerlachNahser  Colton Bennett is a 83 y.o. male who presents today for routine electrophysiology followup.  Since last being seen in our clinic, the patient reports doing very well.  He still enjoys fishing.  He works out regularly at [employer] without limitation.  Today, he denies symptoms of palpitations, chest pain, shortness of breath,  lower extremity edema, dizziness, presyncope, or syncope.  The patient is otherwise without complaint today.   Past Medical History:  Diagnosis Date  . Chest pain   . Dizziness   . DOE (dyspnea on exertion)   . GERD (gastroesophageal reflux disease)   . History of cardiovascular stress test    ETT-Myoview (06/07/13): No ischemia, EF 56%; normal study.  . History of echocardiogram    Echocardiogram (01/2013): EF of 50-55%, normal wall motion, trivial AI, mild MR, mild to moderate LAE  . OSA on CPAP   . Presence of permanent cardiac pacemaker   . SSS (sick sinus syndrome) ([facility])   . Syncope, near    Past Surgical History:  Procedure Laterality Date  . EP IMPLANTABLE DEVICE N/A 08/23/2015   MDT Advisa DR MRI compatible pacemaker implanted by Dr Johney FrameAllred for sick sinus and syncope  . EP IMPLANTABLE DEVICE N/A 08/23/2015   Procedure: Loop Recorder Removal;  Surgeon: Hillis RangeJames Demetre Monaco, MD;  [facility];  Service: Cardiovascular;  Laterality: N/A;  . LOOP RECORDER IMPLANT  September 2014  . LOOP RECORDER IMPLANT N/A 04/05/2013   Procedure: LOOP RECORDER IMPLANT;  Surgeon: Gardiner RhymeJames D Rahkeem Senft, MD;  Location: [facility];  Service: Cardiovascular;  Laterality: N/A;    ROS- all systems are reviewed and negative except as per HPI above  Current Outpatient Prescriptions  Medication Sig Dispense Refill  . alfuzosin (UROXATRAL) 10 MG 24 hr tablet Take 10 mg by mouth daily with breakfast.     . aspirin 81 MG tablet Take 81 mg by mouth daily.    . cyanocobalamin 2000 MCG tablet Take 2,500 mcg by mouth daily.     . Multiple Vitamins-Minerals (MULTIVITAMIN PO) Take 1 tablet by mouth daily.    Marland Kitchen. omeprazole (PRILOSEC) 40 MG capsule Take 40 mg by mouth daily.     No current facility-administered medications for this visit.     Physical Exam: Vitals:   12/14/16 1001  BP: 118/61  Pulse: 78  Weight: 212 lb 3.2 oz (96.3 kg)  Height: 6' (1.829 m)    GEN- The patient is well appearing, alert and oriented x 3 today.   Head- normocephalic, atraumatic Eyes-  Sclera clear, conjunctiva pink Ears- hearing intact Oropharynx- clear Lungs- Clear to ausculation bilaterally, normal work of breathing Chest- pacemaker pocket is well healed Heart- Regular rate and rhythm, no murmurs, rubs or gallops, PMI not laterally displaced GI- soft, NT, ND, + BS Extremities- no clubbing, cyanosis, or edema  Pacemaker interrogation- reviewed in detail today,  See PACEART report  Assessment and Plan:  1. Symptomatic sinus bradycardia Normal pacemaker function See Pace Art report No changes today  2. OSA Compliance with CPAP encouraged  carelink Return to see EP NP in 1 year  Hillis RangeJames Damarie Schoolfield MD, [facility] 12/14/2016 10:56 AM

## 2017-02-01 ENCOUNTER — Ambulatory Visit: Payer: Medicare Other | Admitting: Neurology

## 2017-03-16 ENCOUNTER — Ambulatory Visit (INDEPENDENT_AMBULATORY_CARE_PROVIDER_SITE_OTHER): Payer: Medicare Other | Admitting: *Deleted

## 2017-03-16 ENCOUNTER — Telehealth: Payer: Self-pay | Admitting: Internal Medicine

## 2017-03-16 DIAGNOSIS — I495 Sick sinus syndrome: Secondary | ICD-10-CM

## 2017-03-16 NOTE — Telephone Encounter (Signed)
I helped Mr. Colton Bennett send a transmission- successful. He is appreciative and denies any other concerns.

## 2017-03-16 NOTE — Telephone Encounter (Signed)
New Message    1. Has your device fired? No  2. Is you device beeping? No  3. Are you experiencing draining or swelling at device site? No  4. Are you calling to see if we received your device transmission? Yes  5. Have you passed out? No

## 2017-03-17 NOTE — Progress Notes (Signed)
Remote pacemaker transmission.   

## 2017-03-23 LAB — CUP PACEART REMOTE DEVICE CHECK
Brady Statistic AP VP Percent: 0.01 %
Brady Statistic AP VS Percent: 1.69 %
Brady Statistic AS VS Percent: 98.26 %
Brady Statistic RV Percent Paced: 0.04 %
Date Time Interrogation Session: 20180904134120
Implantable Lead Implant Date: 20170210
Implantable Lead Location: 753860
Implantable Lead Model: 5076
Lead Channel Impedance Value: 456 Ohm
Lead Channel Impedance Value: 627 Ohm
Lead Channel Pacing Threshold Amplitude: 0.75 V
Lead Channel Pacing Threshold Amplitude: 0.75 V
Lead Channel Sensing Intrinsic Amplitude: 1.125 mV
Lead Channel Sensing Intrinsic Amplitude: 1.125 mV
Lead Channel Sensing Intrinsic Amplitude: 14.25 mV
Lead Channel Sensing Intrinsic Amplitude: 14.25 mV
Lead Channel Setting Pacing Pulse Width: 0.4 ms
MDC IDC LEAD IMPLANT DT: 20170210
MDC IDC LEAD LOCATION: 753859
MDC IDC MSMT BATTERY REMAINING LONGEVITY: 109 mo
MDC IDC MSMT BATTERY VOLTAGE: 3.02 V
MDC IDC MSMT LEADCHNL RA IMPEDANCE VALUE: 304 Ohm
MDC IDC MSMT LEADCHNL RA IMPEDANCE VALUE: 456 Ohm
MDC IDC MSMT LEADCHNL RA PACING THRESHOLD PULSEWIDTH: 0.4 ms
MDC IDC MSMT LEADCHNL RV PACING THRESHOLD PULSEWIDTH: 0.4 ms
MDC IDC PG IMPLANT DT: 20170210
MDC IDC SET LEADCHNL RA PACING AMPLITUDE: 2 V
MDC IDC SET LEADCHNL RV PACING AMPLITUDE: 2.5 V
MDC IDC SET LEADCHNL RV SENSING SENSITIVITY: 2.8 mV
MDC IDC STAT BRADY AS VP PERCENT: 0.04 %
MDC IDC STAT BRADY RA PERCENT PACED: 1.7 %

## 2017-03-24 ENCOUNTER — Encounter: Payer: Self-pay | Admitting: Cardiology

## 2017-04-05 ENCOUNTER — Encounter: Payer: Self-pay | Admitting: Cardiovascular Disease

## 2017-04-05 ENCOUNTER — Ambulatory Visit (INDEPENDENT_AMBULATORY_CARE_PROVIDER_SITE_OTHER): Payer: Medicare Other | Admitting: Cardiovascular Disease

## 2017-04-05 VITALS — BP 120/70 | HR 58 | Ht 72.0 in | Wt 207.1 lb

## 2017-04-05 DIAGNOSIS — I495 Sick sinus syndrome: Secondary | ICD-10-CM | POA: Diagnosis not present

## 2017-04-05 DIAGNOSIS — R55 Syncope and collapse: Secondary | ICD-10-CM | POA: Diagnosis not present

## 2017-04-05 NOTE — Patient Instructions (Signed)
Medication Instructions:  STOP Aspirin   Labwork: None Ordered   Testing/Procedures: None Ordered   Follow-Up: Your physician wants you to follow-up in: 1 year with Dr. Nahser.  You will receive a reminder letter in the mail two months in advance. If you don't receive a letter, please call our office to schedule the follow-up appointment.   If you need a refill on your cardiac medications before your next appointment, please call your pharmacy.   Thank you for choosing [facility]! Kiauna Zywicki, RN [phone]    

## 2017-04-05 NOTE — Progress Notes (Signed)
[name] Date of Birth  1933-10-14       [facility]    [place] [address]  [address] [place], [place]  [address]   [place], [place]  [address] [phone]     [phone]   Fax  [phone]    Fax [phone]  Problem List: 1.  Syncope  2. Dyspnea 3.  Sick sinus syndrome -  S/p pacer    History of Present Illness:  Giani is a 83 yo with a recent episode of collapse.  He was seen with his wife who provided most of the accurate history - he seems to play his symptoms down quite a bit.    He got out of his truck and collapsed in the parking lot.  He was talking and remained conscious the whole time.   He had severe nausea and vomitting after that.   He remained unsteady and was not able to walk for several hours.  He did not want to go to the doctor or ER.    He felt better after several days.  He were 30 day event monitor. He was found to have normal sinus rhythm. He had occasional episodes of frequent premature ventricular contractions-occasionally in a bigeminal pattern.  He did not have any symptoms while wearing the monitor.  He had an echocardiogram which revealed normal left ventricle systolic function with an ejection fraction of 50-55%. He had normal wall motion. He had trivial aortic insufficiency, mild mitral regurgitation. The left atrium was mildly to moderately dilated.  His EEG was normal.  He's had 2 different sleep studies. The first study was July 31 which revealed severe sleep apnea during REM sleep. He had a second sleep study the results of which are not known at this time.  His MRI was normal. The head MRA is normal.  September 27, 2013:  Mr. Rammel has had an implantable loop recorder placed.   He's had a few episodes of presyncope.  He exercises on occasion. He denies any chest pain or shortness breath.  He was found to have a 3 second pause with his ILR.    Sept. 16,2015:  Griselda has done well.   No further syncopal episodes.   Still has the implantable loop.    Has been busy working in his garden this summer.   Sept. 19 , 2016: Had an episode of dizziness 2 weeks ago .  No CP   Sept. 7, 2017:  Al is seen today for follow up visit No further passing out episodes.  No CP or dyspnea  Sept. 24, 2018  Al is seen , some DOE (according to wife) but he thinks hes doing fine .  No CP , or dizziness.  Gets pacer checked remotely    Current Outpatient Prescriptions on File Prior to Visit  Medication Sig Dispense Refill  . alfuzosin (UROXATRAL) 10 MG 24 hr tablet Take 10 mg by mouth daily with breakfast.     . aspirin 81 MG tablet Take 81 mg by mouth daily.    . cyanocobalamin 2000 MCG tablet Take 2,500 mcg by mouth daily.    . Multiple Vitamins-Minerals (MULTIVITAMIN PO) Take 1 tablet by mouth daily.    Marland Kitchen omeprazole (PRILOSEC) 40 MG capsule Take 40 mg by mouth daily.     No current facility-administered medications on file prior to visit.     No Known Allergies  Past Medical History:  Diagnosis Date  .  Chest pain   . Dizziness   . DOE (dyspnea on exertion)   . GERD (gastroesophageal reflux disease)   . History of cardiovascular stress test    ETT-Myoview (06/07/13): No ischemia, EF 56%; normal study.  . History of echocardiogram    Echocardiogram (01/2013): EF of 50-55%, normal wall motion, trivial AI, mild MR, mild to moderate LAE  . OSA on CPAP   . Presence of permanent cardiac pacemaker   . SSS (sick sinus syndrome) (HCC)   . Syncope, near     Past Surgical History:  Procedure Laterality Date  . EP IMPLANTABLE DEVICE N/A 08/23/2015   MDT Advisa DR MRI compatible pacemaker implanted by Dr Johney Frame for sick sinus and syncope  . EP IMPLANTABLE DEVICE N/A 08/23/2015   Procedure: Loop Recorder Removal;  Surgeon: Hillis Range, MD;  Location: [facility] INVASIVE CV LAB;  Service: Cardiovascular;  Laterality: N/A;  . LOOP RECORDER IMPLANT  September 2014  . LOOP RECORDER IMPLANT N/A  04/05/2013   Procedure: LOOP RECORDER IMPLANT;  Surgeon: Gardiner Rhyme, MD;  Location: [facility] CATH LAB;  Service: Cardiovascular;  Laterality: N/A;    History  Smoking Status  . Former Smoker  . Packs/day: 3.00  . Years: 30.00  . Types: Cigarettes  . Quit date: 02/01/1982  Smokeless Tobacco  . Never Used    History  Alcohol Use No    Family History  Problem Relation Age of Onset  . CAD Neg Hx     Reviw of Systems:  Reviewed in the HPI.  All other systems are negative.  Physical Exam: Blood pressure 120/70, pulse (!) 58, height 6' (1.829 m), weight 207 lb 1.9 oz (93.9 kg), SpO2 99 %. General: Well developed, well nourished, in no acute distress.  Head: Normocephalic, atraumatic, sclera non-icteric, mucus membranes are moist,   Neck: Supple. Carotids are 2 + without bruits. No JVD   Lungs: Clear   Heart: RR, normal S1, S2, occasional PVCs  Abdomen: Soft, non-tender, non-distended with normal bowel sounds.  Msk:  Strength and tone are normal   Extremities: No clubbing or cyanosis. No edema.  Distal pedal pulses are 2+ and equal    Neuro: CN II - XII intact.  Alert and oriented X 3.   Psych:  Normal   ECG:  Setp.  24, 2018:   sinus brady at 58.   Otherwise normal    Assessment / Plan:   1.  Sick sinus syndrome - s/p pacer    He's not had any further episodes of presyncope following his pacemaker. Able to do all of his normal activity is without any serious problems.  No CP or dyspnea   Will see in 1 year.      Kristeen Miss, MD  04/05/2017 10:48 AM    [facility] [address] [place],  [address] [place], [place]  [address] Pager [phone] Phone: [phone]; Fax: [phone]

## 2017-06-15 ENCOUNTER — Telehealth: Payer: Self-pay | Admitting: Cardiology

## 2017-06-15 ENCOUNTER — Ambulatory Visit (INDEPENDENT_AMBULATORY_CARE_PROVIDER_SITE_OTHER): Payer: Medicare Other | Admitting: *Deleted

## 2017-06-15 DIAGNOSIS — I495 Sick sinus syndrome: Secondary | ICD-10-CM

## 2017-06-15 NOTE — Telephone Encounter (Signed)
Spoke with pt and reminded pt of remote transmission that is due today. Pt verbalized understanding.   

## 2017-06-16 ENCOUNTER — Encounter: Payer: Self-pay | Admitting: Cardiology

## 2017-06-16 LAB — CUP PACEART REMOTE DEVICE CHECK
Brady Statistic AP VP Percent: 0 %
Brady Statistic AP VS Percent: 1.47 %
Brady Statistic AS VP Percent: 0.04 %
Brady Statistic AS VS Percent: 98.49 %
Implantable Lead Implant Date: 20170210
Implantable Lead Location: 753859
Implantable Lead Location: 753860
Implantable Lead Model: 5076
Lead Channel Impedance Value: 304 Ohm
Lead Channel Impedance Value: 456 Ohm
Lead Channel Pacing Threshold Pulse Width: 0.4 ms
Lead Channel Pacing Threshold Pulse Width: 0.4 ms
Lead Channel Sensing Intrinsic Amplitude: 0.75 mV
Lead Channel Sensing Intrinsic Amplitude: 13.5 mV
Lead Channel Sensing Intrinsic Amplitude: 13.5 mV
Lead Channel Setting Pacing Amplitude: 2 V
Lead Channel Setting Pacing Amplitude: 2.5 V
Lead Channel Setting Pacing Pulse Width: 0.4 ms
MDC IDC LEAD IMPLANT DT: 20170210
MDC IDC MSMT BATTERY REMAINING LONGEVITY: 107 mo
MDC IDC MSMT BATTERY VOLTAGE: 3.02 V
MDC IDC MSMT LEADCHNL RA IMPEDANCE VALUE: 475 Ohm
MDC IDC MSMT LEADCHNL RA PACING THRESHOLD AMPLITUDE: 0.875 V
MDC IDC MSMT LEADCHNL RA SENSING INTR AMPL: 0.75 mV
MDC IDC MSMT LEADCHNL RV IMPEDANCE VALUE: 627 Ohm
MDC IDC MSMT LEADCHNL RV PACING THRESHOLD AMPLITUDE: 0.625 V
MDC IDC PG IMPLANT DT: 20170210
MDC IDC SESS DTM: 20181204202430
MDC IDC SET LEADCHNL RV SENSING SENSITIVITY: 2.8 mV
MDC IDC STAT BRADY RA PERCENT PACED: 1.48 %
MDC IDC STAT BRADY RV PERCENT PACED: 0.04 %

## 2017-06-16 NOTE — Progress Notes (Signed)
Remote pacemaker transmission.   

## 2017-09-14 ENCOUNTER — Ambulatory Visit (INDEPENDENT_AMBULATORY_CARE_PROVIDER_SITE_OTHER): Payer: Medicare Other | Admitting: *Deleted

## 2017-09-14 DIAGNOSIS — I495 Sick sinus syndrome: Secondary | ICD-10-CM

## 2017-09-14 LAB — CUP PACEART REMOTE DEVICE CHECK
Battery Remaining Longevity: 104 mo
Brady Statistic AP VP Percent: 0.01 %
Brady Statistic AP VS Percent: 0.87 %
Brady Statistic AS VS Percent: 99.08 %
Brady Statistic RV Percent Paced: 0.05 %
Implantable Lead Implant Date: 20170210
Implantable Lead Location: 753859
Implantable Lead Model: 5076
Implantable Lead Model: 5076
Lead Channel Impedance Value: 475 Ohm
Lead Channel Impedance Value: 646 Ohm
Lead Channel Pacing Threshold Amplitude: 0.625 V
Lead Channel Sensing Intrinsic Amplitude: 1.875 mV
Lead Channel Sensing Intrinsic Amplitude: 14.875 mV
Lead Channel Setting Pacing Amplitude: 2.5 V
Lead Channel Setting Sensing Sensitivity: 2.8 mV
MDC IDC LEAD IMPLANT DT: 20170210
MDC IDC LEAD LOCATION: 753860
MDC IDC MSMT BATTERY VOLTAGE: 3.02 V
MDC IDC MSMT LEADCHNL RA IMPEDANCE VALUE: 342 Ohm
MDC IDC MSMT LEADCHNL RA PACING THRESHOLD AMPLITUDE: 0.875 V
MDC IDC MSMT LEADCHNL RA PACING THRESHOLD PULSEWIDTH: 0.4 ms
MDC IDC MSMT LEADCHNL RA SENSING INTR AMPL: 1.875 mV
MDC IDC MSMT LEADCHNL RV IMPEDANCE VALUE: 475 Ohm
MDC IDC MSMT LEADCHNL RV PACING THRESHOLD PULSEWIDTH: 0.4 ms
MDC IDC MSMT LEADCHNL RV SENSING INTR AMPL: 14.875 mV
MDC IDC PG IMPLANT DT: 20170210
MDC IDC SESS DTM: 20190305140911
MDC IDC SET LEADCHNL RA PACING AMPLITUDE: 2 V
MDC IDC SET LEADCHNL RV PACING PULSEWIDTH: 0.4 ms
MDC IDC STAT BRADY AS VP PERCENT: 0.04 %
MDC IDC STAT BRADY RA PERCENT PACED: 0.88 %

## 2017-09-14 NOTE — Progress Notes (Signed)
Remote pacemaker transmission.   

## 2017-09-15 ENCOUNTER — Encounter: Payer: Self-pay | Admitting: Cardiology

## 2017-09-30 ENCOUNTER — Telehealth: Payer: Self-pay | Admitting: Cardiovascular Disease

## 2017-09-30 NOTE — Telephone Encounter (Signed)
New message     STAT if patient feels like he/she is going to faint   1) Are you dizzy now?  A little bit right now , it started a few days ago   2) Do you feel faint or have you passed out? No but he did have a fall   3) Do you have any other symptoms? No other symptoms bp is fine   4) Have you checked your HR and BP (record if available)?  Yesterday 133/72

## 2017-09-30 NOTE — Telephone Encounter (Signed)
Spoke with pt, pt stated that he some episodes on Monday and Tuesday of this week, asked pt to send in a transmission, pt stated that he would and I would review it and call him back.

## 2017-09-30 NOTE — Telephone Encounter (Signed)
Spoke with pt informed him that pacemaker transmission was normal there no fast heart rates and that the pacemaker was functioning normally, asked pt to take BP in the morning when he first gets up to see if that could be a cause, pt also stated that he hasn't had any episodes since.

## 2017-10-04 ENCOUNTER — Encounter: Payer: Self-pay | Admitting: Physician Assistant

## 2017-10-04 ENCOUNTER — Ambulatory Visit (INDEPENDENT_AMBULATORY_CARE_PROVIDER_SITE_OTHER): Payer: Medicare Other | Admitting: Physician Assistant

## 2017-10-04 VITALS — BP 141/68 | HR 60 | Ht 72.0 in | Wt 209.0 lb

## 2017-10-04 DIAGNOSIS — R42 Dizziness and giddiness: Secondary | ICD-10-CM | POA: Diagnosis not present

## 2017-10-04 DIAGNOSIS — R0609 Other forms of dyspnea: Secondary | ICD-10-CM

## 2017-10-04 DIAGNOSIS — G4733 Obstructive sleep apnea (adult) (pediatric): Secondary | ICD-10-CM

## 2017-10-04 DIAGNOSIS — I495 Sick sinus syndrome: Secondary | ICD-10-CM

## 2017-10-04 NOTE — Progress Notes (Signed)
Cardiology Office Note    Date:  10/04/2017   ID:  Colton Criglerlton F Wiacek, DOB 08/06/1933, MRN [id number]  PCP:  Joette CatchingNyland, Leonard, MD  Cardiologist:  Dr. Elease HashimotoNahser Electrophysiologist: Dr. Johney FrameAllred  Chief Complaint: Dizziness  History of Present Illness:   Colton Bennett is a 84 y.o. male with hx of syncope, symptomatic sinus bradycardia s/p pacemaker, OSA on CPAP presents for dizziness.   Echo 01/2013 showed normal LVEF of 50-55%, no WM abnormality, mild MR, mild to moderately dilated LA. Normal Myoview 05/2013.   He was doing well on cardiac stand poin when last seen by Dr. Johney FrameAllred 12/2016 & Dr. Elease HashimotoNahser 03/2017.  Recently called for dizziness. Normal device function per telephone note 09/30/17.  Here today for follow up.  Patient had one episode of dizziness on 09/27/17.  He stand up and went to the bathroom.  On his way back, he felt sudden dizziness without any prodrome of chest pain, shortness of breath no syncope.  Symptoms lasted for about 20 minutes.   No recurrence.  She felt weak for a few days but now back to normal.  Patient was treated with severe nausea vomiting and diarrhea beginning of March.  Now resolved.  Patient goes for exercise 3 times per week.  He does treadmill, leg press, and upper extremity exercise for approximately 75-90 minutes without any limitation.  Wife has noted intermittent dyspnea on exertion however patient denies.  Past Medical History:  Diagnosis Date  . Chest pain   . Dizziness   . DOE (dyspnea on exertion)   . GERD (gastroesophageal reflux disease)   . History of cardiovascular stress test    ETT-Myoview (06/07/13): No ischemia, EF 56%; normal study.  . History of echocardiogram    Echocardiogram (01/2013): EF of 50-55%, normal wall motion, trivial AI, mild MR, mild to moderate LAE  . OSA on CPAP   . Presence of permanent cardiac pacemaker   . SSS (sick sinus syndrome) (HCC)   . Syncope, near     Past Surgical History:  Procedure Laterality Date  . EP  IMPLANTABLE DEVICE N/A 08/23/2015   MDT Advisa DR MRI compatible pacemaker implanted by Dr Johney FrameAllred for sick sinus and syncope  . EP IMPLANTABLE DEVICE N/A 08/23/2015   Procedure: Loop Recorder Removal;  Surgeon: Hillis RangeJames Allred, MD;  [facility];  Service: Cardiovascular;  Laterality: N/A;  . LOOP RECORDER IMPLANT  September 2014  . LOOP RECORDER IMPLANT N/A 04/05/2013   Procedure: LOOP RECORDER IMPLANT;  Surgeon: Gardiner RhymeJames D Allred, MD;  Location: [facility] CATH LAB;  Service: Cardiovascular;  Laterality: N/A;    Current Medications: Prior to Admission medications   Medication Sig Start Date End Date Taking? Authorizing Provider  alfuzosin (UROXATRAL) 10 MG 24 hr tablet Take 10 mg by mouth daily with breakfast.     [provider]  cyanocobalamin 2000 MCG tablet Take 2,500 mcg by mouth daily.    [provider]  Multiple Vitamins-Minerals (MULTIVITAMIN PO) Take 1 tablet by mouth daily.    [provider]  omeprazole (PRILOSEC) 40 MG capsule Take 40 mg by mouth daily.    [provider]    Allergies:   Patient has no known allergies.   Social History   Socioeconomic History  . Marital status: Married    Spouse name: Not on file  . Number of children: 1  . Years of education: Not on file  . Highest education level: Not on file  Occupational History  .  Occupation: 'retired  Engineer, production  . Financial resource strain: Not on file  . Food insecurity:    Worry: Not on file    Inability: Not on file  . Transportation needs:    Medical: Not on file    Non-medical: Not on file  Tobacco Use  . Smoking status: Former Smoker    Packs/day: 3.00    Years: 30.00    Pack years: 90.00    Types: Cigarettes    Last attempt to quit: 02/01/1982    Years since quitting: 35.6  . Smokeless tobacco: Never Used  Substance and Sexual Activity  . Alcohol use: No    Alcohol/week: 0.0 oz  . Drug use: No  . Sexual activity: Not on file  Lifestyle  . Physical  activity:    Days per week: Not on file    Minutes per session: Not on file  . Stress: Not on file  Relationships  . Social connections:    Talks on phone: Not on file    Gets together: Not on file    Attends religious service: Not on file    Active member of club or organization: Not on file    Attends meetings of clubs or organizations: Not on file    Relationship status: Not on file  Other Topics Concern  . Not on file  Social History Narrative  . Not on file     Family History:  The patient's family history is not on file.   ROS:   Please see the history of present illness.    ROS All other systems reviewed and are negative.   PHYSICAL EXAM:   VS:  BP (!) 141/68   Pulse 60   Ht 6' (1.829 m)   Wt 209 lb (94.8 kg)   SpO2 98%   BMI 28.35 kg/m    GEN: Well nourished, well developed, in no acute distress  HEENT: normal  Neck: no JVD, carotid bruits, or masses Cardiac: RRR; no murmurs, rubs, or gallops,no edema  Respiratory:  clear to auscultation bilaterally, normal work of breathing GI: soft, nontender, nondistended, + BS MS: no deformity or atrophy  Skin: warm and dry, no rash Neuro:  Alert and Oriented x 3, Strength and sensation are intact Psych: euthymic mood, full affect  Wt Readings from Last 3 Encounters:  10/04/17 209 lb (94.8 kg)  04/05/17 207 lb 1.9 oz (93.9 kg)  12/14/16 212 lb 3.2 oz (96.3 kg)      Studies/Labs Reviewed:   EKG:  EKG is ordered today.  The ekg ordered today demonstrates sinus bradycardia at rate of 59 bpm.  No acute changes.  Recent Labs: No results found for requested labs within last 8760 hours.   Lipid Panel No results found for: CHOL, TRIG, HDL, CHOLHDL, VLDL, LDLCALC, LDLDIRECT  Additional studies/ records that were reviewed today include:   AS summarized above  ASSESSMENT & PLAN:    1. Dizziness No loss of consciousness. Likely precipitated by acute viral illness with vomiting and diarrhea.  No recurrence.  His  symptoms has been resolved.  He is not orthostatic by vitals today.  No further workup needed.  EKG reassuring.  2. OSA on CPAP -Compliant.    3. SSS s/p pacemker - Normal device function as noted above.  4. DOE -Intermittently noted by wife.  However, patient denies it.  He does exercise without any limitation.  Encouraged to continue exercise regimen.  Advised to watch on salt intake.  He is  euvolemic on exam.  No workup needed currently.  Medication Adjustments/Labs and Tests Ordered: Current medicines are reviewed at length with the patient today.  Concerns regarding medicines are outlined above.  Medication changes, Labs and Tests ordered today are listed in the Patient Instructions below. Patient Instructions  Medication Instructions:   Your physician recommends that you continue on your current medications as directed. Please refer to the Current Medication list given to you today.   If you need a refill on your cardiac medications before your next appointment, please call your pharmacy.  Labwork: NONE ORDERED  TODAY    Testing/Procedures: NONE ORDERED  TODAY    Follow-Up: WITH DR Elease Hashimoto IN SEPTEMBER    Any Other Special Instructions Will Be Listed Below (If Applicable).                                                                                                                                                      Lorelei Pont, [place]  10/04/2017 12:04 PM    [facility] [address] [place], [place], [place]  [address] Phone: [phone]; Fax: [phone]

## 2017-10-04 NOTE — Patient Instructions (Addendum)
Medication Instructions:   Your physician recommends that you continue on your current medications as directed. Please refer to the Current Medication list given to you today.   If you need a refill on your cardiac medications before your next appointment, please call your pharmacy.  Labwork: NONE ORDERED  TODAY    Testing/Procedures: NONE ORDERED  TODAY    Follow-Up: WITH DR Elease HashimotoNAHSER IN SEPTEMBER    Any Other Special Instructions Will Be Listed Below (If Applicable).

## 2017-12-14 ENCOUNTER — Ambulatory Visit (INDEPENDENT_AMBULATORY_CARE_PROVIDER_SITE_OTHER): Payer: Medicare Other | Admitting: *Deleted

## 2017-12-14 DIAGNOSIS — I495 Sick sinus syndrome: Secondary | ICD-10-CM

## 2017-12-14 NOTE — Progress Notes (Signed)
Remote pacemaker transmission.   

## 2017-12-19 NOTE — Progress Notes (Signed)
Electrophysiology Office Note Date: 12/19/2017  ID:  Colton Bennett, DOB 02/22/1934, MRN [id number]  PCP: Joette CatchingNyland, Leonard, MD Primary Cardiologist: Nahser Electrophysiologist: Allred  CC: Pacemaker follow-up  Colton Bennett is a 84 y.o. male seen today for Dr Johney FrameAllred.  He presents today for routine electrophysiology followup.  Since last being seen in our clinic, the patient reports doing very well.  He denies chest pain, palpitations, dyspnea, PND, orthopnea, nausea, vomiting, dizziness, syncope, edema, weight gain, or early satiety.   Past Medical History:  Diagnosis Date  . Chest pain   . Dizziness   . DOE (dyspnea on exertion)   . GERD (gastroesophageal reflux disease)   . History of cardiovascular stress test    ETT-Myoview (06/07/13): No ischemia, EF 56%; normal study.  . History of echocardiogram    Echocardiogram (01/2013): EF of 50-55%, normal wall motion, trivial AI, mild MR, mild to moderate LAE  . OSA on CPAP   . Presence of permanent cardiac pacemaker   . SSS (sick sinus syndrome) (HCC)   . Syncope, near    Past Surgical History:  Procedure Laterality Date  . EP IMPLANTABLE DEVICE N/A 08/23/2015   MDT Advisa DR MRI compatible pacemaker implanted by Dr Johney FrameAllred for sick sinus and syncope  . EP IMPLANTABLE DEVICE N/A 08/23/2015   Procedure: Loop Recorder Removal;  Surgeon: Hillis RangeJames Allred, MD;  [facility];  Service: Cardiovascular;  Laterality: N/A;  . LOOP RECORDER IMPLANT  September 2014  . LOOP RECORDER IMPLANT N/A 04/05/2013   Procedure: LOOP RECORDER IMPLANT;  Surgeon: Gardiner RhymeJames D Allred, MD;  Location: [facility] CATH LAB;  Service: Cardiovascular;  Laterality: N/A;    Current Outpatient Medications  Medication Sig Dispense Refill  . alfuzosin (UROXATRAL) 10 MG 24 hr tablet Take 10 mg by mouth daily with breakfast.     . aspirin 81 MG tablet Take 81 mg by mouth daily.    . cyanocobalamin 2000 MCG tablet Take 2,500 mcg by mouth daily.    Marland Kitchen. omeprazole (PRILOSEC)  40 MG capsule Take 40 mg by mouth daily.     No current facility-administered medications for this visit.     Allergies:   Patient has no known allergies.   Social History: Social History   Socioeconomic History  . Marital status: Married    Spouse name: Not on file  . Number of children: 1  . Years of education: Not on file  . Highest education level: Not on file  Occupational History  . Occupation: 'retired  Engineer, productionocial Needs  . Financial resource strain: Not on file  . Food insecurity:    Worry: Not on file    Inability: Not on file  . Transportation needs:    Medical: Not on file    Non-medical: Not on file  Tobacco Use  . Smoking status: Former Smoker    Packs/day: 3.00    Years: 30.00    Pack years: 90.00    Types: Cigarettes    Last attempt to quit: 02/01/1982    Years since quitting: 35.9  . Smokeless tobacco: Never Used  Substance and Sexual Activity  . Alcohol use: No    Alcohol/week: 0.0 oz  . Drug use: No  . Sexual activity: Not on file  Lifestyle  . Physical activity:    Days per week: Not on file    Minutes per session: Not on file  . Stress: Not on file  Relationships  . Social connections:  Talks on phone: Not on file    Gets together: Not on file    Attends religious service: Not on file    Active member of club or organization: Not on file    Attends meetings of clubs or organizations: Not on file    Relationship status: Not on file  . Intimate partner violence:    Fear of current or ex partner: Not on file    Emotionally abused: Not on file    Physically abused: Not on file    Forced sexual activity: Not on file  Other Topics Concern  . Not on file  Social History Narrative  . Not on file    Family History: Family History  Problem Relation Age of Onset  . CAD Neg Hx      Review of Systems: All other systems reviewed and are otherwise negative except as noted above.   Physical Exam: VS:  There were no vitals taken for this  visit. , BMI There is no height or weight on file to calculate BMI.  GEN- The patient is well appearing, alert and oriented x 3 today.   HEENT: normocephalic, atraumatic; sclera clear, conjunctiva pink; hearing intact; oropharynx clear; neck supple  Lungs- Clear to ausculation bilaterally, normal work of breathing.  No wheezes, rales, rhonchi Heart- Regular rate and rhythm, no murmurs, rubs or gallops  GI- soft, non-tender, non-distended, bowel sounds present  Extremities- no clubbing, cyanosis, or edema  MS- no significant deformity or atrophy Skin- warm and dry, no rash or lesion; PPM pocket well healed Psych- euthymic mood, full affect Neuro- strength and sensation are intact  PPM Interrogation- reviewed in detail today,  See PACEART report  EKG:  EKG is not ordered today.  Recent Labs: No results found for requested labs within last 8760 hours.   Wt Readings from Last 3 Encounters:  10/04/17 209 lb (94.8 kg)  04/05/17 207 lb 1.9 oz (93.9 kg)  12/14/16 212 lb 3.2 oz (96.3 kg)     Other studies Reviewed: Additional studies/ records that were reviewed today include: Dr Elease Hashimoto and Dr Jenel Lucks office notes   Assessment and Plan:  1.  Symptomatic bradycardia Normal PPM function See Pace Art report No changes today   Current medicines are reviewed at length with the patient today.   The patient does not have concerns regarding his medicines.  The following changes were made today:  none  Labs/ tests ordered today include: none No orders of the defined types were placed in this encounter.    Disposition:   Follow up with [facility], Dr Johney Frame 1 year     Signed, Gypsy Balsam, NP 12/19/2017 12:45 PM  [facility] [address] [place] [address] [phone] (office) [phone] (fax)

## 2017-12-20 ENCOUNTER — Ambulatory Visit (INDEPENDENT_AMBULATORY_CARE_PROVIDER_SITE_OTHER): Payer: Medicare Other | Admitting: Nurse Practitioner

## 2017-12-20 VITALS — BP 118/68 | Ht 72.0 in | Wt 213.0 lb

## 2017-12-20 DIAGNOSIS — I495 Sick sinus syndrome: Secondary | ICD-10-CM | POA: Diagnosis not present

## 2017-12-20 LAB — CUP PACEART INCLINIC DEVICE CHECK
Implantable Lead Implant Date: 20170210
Implantable Lead Location: 753859
Implantable Lead Model: 5076
Implantable Lead Model: 5076
Implantable Pulse Generator Implant Date: 20170210
MDC IDC LEAD IMPLANT DT: 20170210
MDC IDC LEAD LOCATION: 753860
MDC IDC SESS DTM: 20190610114825

## 2017-12-20 NOTE — Patient Instructions (Addendum)
Medication Instructions:  Your physician recommends that you continue on your current medications as directed. Please refer to the Current Medication list given to you today.   If you need a refill on your cardiac medications before your next appointment, please call your pharmacy.  Labwork: NONE ORDERED  TODAY   Testing/Procedures: NONE ORDERED  TODAY   Follow-Up:  Your physician wants you to follow-up in: ONE YEAR WITH ALLRED  You will receive a reminder letter in the mail two months in advance. If you don't receive a letter, please call our office to schedule the follow-up appointment.   Remote monitoring is used to monitor your Pacemaker of ICD from home. This monitoring reduces the number of office visits required to check your device to one time per year. It allows us to keep an eye on the functioning of your device to ensure it is working properly. You are scheduled for a device check from home on .  03-15-18  . After your physician reviews your transmission, you will receive a postcard with your next transmission date.     Any Other Special Instructions Will Be Listed Below (If Applicable).

## 2017-12-24 ENCOUNTER — Encounter: Payer: Medicare Other | Admitting: Nurse Practitioner

## 2018-01-20 LAB — CUP PACEART REMOTE DEVICE CHECK
Battery Remaining Longevity: 94 mo
Battery Voltage: 3.01 V
Brady Statistic AP VS Percent: 2.01 %
Brady Statistic AS VS Percent: 97.95 %
Implantable Lead Implant Date: 20170210
Implantable Lead Model: 5076
Implantable Pulse Generator Implant Date: 20170210
Lead Channel Impedance Value: 323 Ohm
Lead Channel Impedance Value: 475 Ohm
Lead Channel Pacing Threshold Amplitude: 0.5 V
Lead Channel Pacing Threshold Amplitude: 0.875 V
Lead Channel Pacing Threshold Pulse Width: 0.4 ms
Lead Channel Sensing Intrinsic Amplitude: 1.5 mV
Lead Channel Sensing Intrinsic Amplitude: 1.5 mV
Lead Channel Sensing Intrinsic Amplitude: 13.75 mV
Lead Channel Setting Pacing Amplitude: 2.5 V
Lead Channel Setting Sensing Sensitivity: 2.8 mV
MDC IDC LEAD IMPLANT DT: 20170210
MDC IDC LEAD LOCATION: 753859
MDC IDC LEAD LOCATION: 753860
MDC IDC MSMT LEADCHNL RA PACING THRESHOLD PULSEWIDTH: 0.4 ms
MDC IDC MSMT LEADCHNL RV IMPEDANCE VALUE: 475 Ohm
MDC IDC MSMT LEADCHNL RV IMPEDANCE VALUE: 627 Ohm
MDC IDC MSMT LEADCHNL RV SENSING INTR AMPL: 13.75 mV
MDC IDC SESS DTM: 20190604120750
MDC IDC SET LEADCHNL RA PACING AMPLITUDE: 2 V
MDC IDC SET LEADCHNL RV PACING PULSEWIDTH: 0.4 ms
MDC IDC STAT BRADY AP VP PERCENT: 0.01 %
MDC IDC STAT BRADY AS VP PERCENT: 0.04 %
MDC IDC STAT BRADY RA PERCENT PACED: 2.02 %
MDC IDC STAT BRADY RV PERCENT PACED: 0.04 %

## 2018-03-15 ENCOUNTER — Ambulatory Visit (INDEPENDENT_AMBULATORY_CARE_PROVIDER_SITE_OTHER): Payer: Medicare Other | Admitting: *Deleted

## 2018-03-15 DIAGNOSIS — I495 Sick sinus syndrome: Secondary | ICD-10-CM

## 2018-03-15 NOTE — Progress Notes (Signed)
Remote pacemaker transmission.   

## 2018-04-05 ENCOUNTER — Ambulatory Visit (INDEPENDENT_AMBULATORY_CARE_PROVIDER_SITE_OTHER): Payer: Medicare Other | Admitting: Cardiovascular Disease

## 2018-04-05 ENCOUNTER — Encounter: Payer: Self-pay | Admitting: Cardiovascular Disease

## 2018-04-05 VITALS — BP 110/60 | HR 64 | Ht 72.0 in | Wt 203.4 lb

## 2018-04-05 DIAGNOSIS — I495 Sick sinus syndrome: Secondary | ICD-10-CM

## 2018-04-05 NOTE — Patient Instructions (Signed)
Medication Instructions:  Your physician recommends that you continue on your current medications as directed. Please refer to the Current Medication list given to you today.  Labwork: None  Testing/Procedures: None  Follow-Up: Your physician recommends that you schedule a follow-up appointment as needed with Dr. Elease HashimotoNahser.    Any Other Special Instructions Will Be Listed Below (If Applicable).     If you need a refill on your cardiac medications before your next appointment, please call your pharmacy.

## 2018-04-05 NOTE — Progress Notes (Signed)
Colton CriglerAlton F Simeone Date of Birth  06/25/1934       [facility]    [place] [facility] [address], [address]  [address], [address] [place], [place]  [id number]   [place]  [id number] [phone]     [phone]   Fax  [phone]    Fax [phone]  Problem List: 1. Syncope  2. Dyspnea 3.  Sick sinus syndrome -  S/p pacer    Colton Bennett is a 84 yo with a recent episode of collapse.  He was seen with his wife who provided most of the accurate history - he seems to play his symptoms down quite a bit.    He got out of his truck and collapsed in the parking lot.  He was talking and remained conscious the whole time.   He had severe nausea and vomitting after that.   He remained unsteady and was not able to walk for several hours.  He did not want to go to the doctor or ER.    He felt better after several days.  He were 30 day event monitor. He was found to have normal sinus rhythm. He had occasional episodes of frequent premature ventricular contractions-occasionally in a bigeminal pattern.  He did not have any symptoms while wearing the monitor.  He had an echocardiogram which revealed normal left ventricle systolic function with an ejection fraction of 50-55%. He had normal wall motion. He had trivial aortic insufficiency, mild mitral regurgitation. The left atrium was mildly to moderately dilated.  His EEG was normal.  He's had 2 different sleep studies. The first study was July 31 which revealed severe sleep apnea during REM sleep. He had a second sleep study the results of which are not known at this time.  His MRI was normal. The head MRA is normal.  September 27, 2013:  Mr. Colton Bennett has had an implantable loop recorder placed.   He's had a few episodes of presyncope.  He exercises on occasion. He denies any chest pain or shortness breath.  He was found to have a 3 second pause with his ILR.    Sept. 16,2015:  Colton Bennett has done well.  No further syncopal episodes.    Still has the implantable loop.    Has been busy working in his garden this summer.   Sept. 19 , 2016: Had an episode of dizziness 2 weeks ago .  No CP   Sept. 7, 2017:  Colton Bennett is seen today for follow up visit No further passing out episodes.  No CP or dyspnea  Sept. 24, 2018  Colton Bennett is seen , some DOE (according to wife) but he thinks hes doing fine .  No CP , or dizziness.  Gets pacer checked remotely   Sept. 24, 2019: Colton Bennett is seen back today for follow-up of his sick sinus syndrome.  He had a pacemaker placed several years ago and feels well. Also has a history of obstructive sleep apnea.  No CP or dyspnea  Exercised until his foot started hurting . No CP or dyspnea.     Current Outpatient Medications on File Prior to Visit  Medication Sig Dispense Refill  . alfuzosin (UROXATRAL) 10 MG 24 hr tablet Take 10 mg by mouth daily with breakfast.     . cyanocobalamin 2000 MCG tablet Take 2,500 mcg by mouth daily.    Marland Kitchen. omeprazole (PRILOSEC) 40 MG capsule Take 40 mg by mouth daily.     No current  facility-administered medications on file prior to visit.     No Known Allergies  Past Medical History:  Diagnosis Date  . Chest pain   . Dizziness   . DOE (dyspnea on exertion)   . GERD (gastroesophageal reflux disease)   . History of cardiovascular stress test    ETT-Myoview (06/07/13): No ischemia, EF 56%; normal study.  . History of echocardiogram    Echocardiogram (01/2013): EF of 50-55%, normal wall motion, trivial AI, mild MR, mild to moderate LAE  . OSA on CPAP   . Presence of permanent cardiac pacemaker   . SSS (sick sinus syndrome) ([facility])   . Syncope, near     Past Surgical History:  Procedure Laterality Date  . EP IMPLANTABLE DEVICE N/A 08/23/2015   MDT Advisa DR MRI compatible pacemaker implanted by Dr Johney Frame for sick sinus and syncope  . EP IMPLANTABLE DEVICE N/A 08/23/2015   Procedure: Loop Recorder Removal;  Surgeon: Hillis Range, MD;  [facility] INVAS[facility] CV LAB;   Service: Cardiovascular;  Laterality: N/A;  . LOOP RECORDER IMPLANT  September 2014  . LOOP RECORDER IMPLANT N/A 04/05/2013   Procedure: LOOP RECORDER IMPLANT;  Surgeon: Gardiner Rhyme, MD;  Location: [facility] CATH LAB;  Service: Cardiovascular;  Laterality: N/A;    Social History   Tobacco Use  Smoking Status Former Smoker  . Packs/day: 3.00  . Years: 30.00  . Pack years: 90.00  . Types: Cigarettes  . Last attempt to quit: 02/01/1982  . Years since quitting: 36.1  Smokeless Tobacco Never Used    Social History   Substance and Sexual Activity  Alcohol Use No  . Alcohol/week: 0.0 standard drinks    Family History  Problem Relation Age of Onset  . CAD Neg Hx     Reviw of Systems:  Reviewed in the HPI.  All other systems are negative.  Physical Exam: Blood pressure 110/60, pulse 64, height 6' (1.829 m), weight 203 lb 6.4 oz (92.3 kg), SpO2 96 %.  GEN:  Well nourished, well developed in no acute distress HEENT: Normal NECK: No JVD; No carotid bruits LYMPHATICS: No lymphadenopathy CARDIAC: RRR  RESPIRATORY:  Clear to auscultation without rales, wheezing or rhonchi  ABDOMEN: Soft, non-tender, non-distended MUSCULOSKELETAL:  No edema; No deformity  SKIN: Warm and dry NEUROLOGIC:  Alert and oriented x 3   ECG:    Assessment / Plan:   1.  Sick sinus syndrome - s/p pacer    He is done very well since he had his pacemaker placed.  At this point he primarily needs to see the electrophysiology team.  I will see him on an as-needed basis.  Continue to follow-up with his primary medical doctor as well.   Kristeen Miss, MD  04/05/2018 9:50 AM    [facility] [address] [place],  [address] [place], [place]  [address] Pager [phone] Phone: [phone]; Fax: [phone]

## 2018-04-06 LAB — CUP PACEART REMOTE DEVICE CHECK
Battery Voltage: 3.02 V
Brady Statistic AP VP Percent: 0.02 %
Brady Statistic AP VS Percent: 4.7 %
Brady Statistic AS VP Percent: 0.04 %
Brady Statistic AS VS Percent: 95.24 %
Brady Statistic RA Percent Paced: 4.72 %
Implantable Lead Implant Date: 20170210
Implantable Lead Model: 5076
Implantable Pulse Generator Implant Date: 20170210
Lead Channel Impedance Value: 323 Ohm
Lead Channel Impedance Value: 475 Ohm
Lead Channel Impedance Value: 475 Ohm
Lead Channel Pacing Threshold Amplitude: 0.625 V
Lead Channel Pacing Threshold Amplitude: 0.875 V
Lead Channel Pacing Threshold Pulse Width: 0.4 ms
Lead Channel Sensing Intrinsic Amplitude: 13 mV
Lead Channel Sensing Intrinsic Amplitude: 13 mV
Lead Channel Setting Pacing Amplitude: 2 V
Lead Channel Setting Pacing Amplitude: 2.5 V
MDC IDC LEAD IMPLANT DT: 20170210
MDC IDC LEAD LOCATION: 753859
MDC IDC LEAD LOCATION: 753860
MDC IDC MSMT BATTERY REMAINING LONGEVITY: 92 mo
MDC IDC MSMT LEADCHNL RA PACING THRESHOLD PULSEWIDTH: 0.4 ms
MDC IDC MSMT LEADCHNL RA SENSING INTR AMPL: 1.125 mV
MDC IDC MSMT LEADCHNL RA SENSING INTR AMPL: 1.125 mV
MDC IDC MSMT LEADCHNL RV IMPEDANCE VALUE: 646 Ohm
MDC IDC SESS DTM: 20190903124126
MDC IDC SET LEADCHNL RV PACING PULSEWIDTH: 0.4 ms
MDC IDC SET LEADCHNL RV SENSING SENSITIVITY: 2.8 mV
MDC IDC STAT BRADY RV PERCENT PACED: 0.06 %

## 2018-06-14 ENCOUNTER — Ambulatory Visit (INDEPENDENT_AMBULATORY_CARE_PROVIDER_SITE_OTHER): Payer: Medicare Other

## 2018-06-14 DIAGNOSIS — I495 Sick sinus syndrome: Secondary | ICD-10-CM

## 2018-06-14 NOTE — Progress Notes (Signed)
Remote pacemaker transmission.   

## 2018-07-13 LAB — CUP PACEART REMOTE DEVICE CHECK
Battery Remaining Longevity: 99 mo
Battery Voltage: 3.02 V
Brady Statistic AS VS Percent: 93.71 %
Brady Statistic RA Percent Paced: 6.25 %
Implantable Lead Implant Date: 20170210
Implantable Lead Implant Date: 20170210
Implantable Lead Location: 753860
Implantable Pulse Generator Implant Date: 20170210
Lead Channel Impedance Value: 323 Ohm
Lead Channel Impedance Value: 456 Ohm
Lead Channel Impedance Value: 475 Ohm
Lead Channel Pacing Threshold Amplitude: 0.625 V
Lead Channel Pacing Threshold Amplitude: 0.75 V
Lead Channel Pacing Threshold Pulse Width: 0.4 ms
Lead Channel Pacing Threshold Pulse Width: 0.4 ms
Lead Channel Sensing Intrinsic Amplitude: 1 mV
Lead Channel Sensing Intrinsic Amplitude: 14.25 mV
Lead Channel Setting Pacing Amplitude: 2 V
Lead Channel Setting Sensing Sensitivity: 2.8 mV
MDC IDC LEAD LOCATION: 753859
MDC IDC MSMT LEADCHNL RA SENSING INTR AMPL: 1 mV
MDC IDC MSMT LEADCHNL RV IMPEDANCE VALUE: 646 Ohm
MDC IDC MSMT LEADCHNL RV SENSING INTR AMPL: 14.25 mV
MDC IDC SESS DTM: 20191203135858
MDC IDC SET LEADCHNL RV PACING AMPLITUDE: 2.5 V
MDC IDC SET LEADCHNL RV PACING PULSEWIDTH: 0.4 ms
MDC IDC STAT BRADY AP VP PERCENT: 0.01 %
MDC IDC STAT BRADY AP VS PERCENT: 6.24 %
MDC IDC STAT BRADY AS VP PERCENT: 0.04 %
MDC IDC STAT BRADY RV PERCENT PACED: 0.05 %

## 2018-07-27 ENCOUNTER — Telehealth: Payer: Self-pay | Admitting: Neurology

## 2018-07-27 NOTE — Telephone Encounter (Signed)
Patient called and is requesting a New C-Pap machine . Patient states his old machine is getting worn out and using a lot of water.

## 2018-07-27 NOTE — Telephone Encounter (Signed)
Called and spoke to patient and scheduled  His follow with Dr. Frances FurbishAthar  In Feb.

## 2018-07-27 NOTE — Telephone Encounter (Signed)
Pt has not been seen since 2017. We cannot order a new cpap unless pt has been seen in the past year. Please call pt and explain this to him and make him an appt.

## 2018-09-05 ENCOUNTER — Encounter: Payer: Self-pay | Admitting: Neurology

## 2018-09-05 ENCOUNTER — Telehealth: Payer: Self-pay

## 2018-09-05 NOTE — Telephone Encounter (Signed)
I called pt, reminded him to bring his cpap to his visit with Dr. Frances Furbish tomorrow. There is no data from his machine since 2017. Pt verbalized understanding.

## 2018-09-06 ENCOUNTER — Encounter: Payer: Self-pay | Admitting: Neurology

## 2018-09-06 ENCOUNTER — Ambulatory Visit (INDEPENDENT_AMBULATORY_CARE_PROVIDER_SITE_OTHER): Payer: Medicare Other | Admitting: Neurology

## 2018-09-06 VITALS — BP 121/71 | HR 69 | Ht 72.0 in | Wt 204.0 lb

## 2018-09-06 DIAGNOSIS — G4733 Obstructive sleep apnea (adult) (pediatric): Secondary | ICD-10-CM | POA: Diagnosis not present

## 2018-09-06 DIAGNOSIS — Z9989 Dependence on other enabling machines and devices: Secondary | ICD-10-CM

## 2018-09-06 NOTE — Patient Instructions (Signed)
I will write for a new CPAP machine as you should be eligible for one.  Please continue using your CPAP regularly. While your insurance requires that you use CPAP at least 4 hours each night on 70% of the nights, I recommend, that you not skip any nights and use it throughout the night if you can. Getting used to CPAP and staying with the treatment long term does take time and patience and discipline. Untreated obstructive sleep apnea when it is moderate to severe can have an adverse impact on cardiovascular health and raise her risk for heart disease, arrhythmias, hypertension, congestive heart failure, stroke and diabetes. Untreated obstructive sleep apnea causes sleep disruption, nonrestorative sleep, and sleep deprivation. This can have an impact on your day to day functioning and cause daytime sleepiness and impairment of cognitive function, memory loss, mood disturbance, and problems focussing. Using CPAP regularly can improve these symptoms.  You will need a routine follow up in 3 months and then yearly.

## 2018-09-06 NOTE — Progress Notes (Signed)
Order for new cpap sent to [facility] via community message. Confirmation received that the order transmitted was successful.    

## 2018-09-06 NOTE — Progress Notes (Signed)
Subjective:    Patient ID: Colton Bennett is a 85 y.o. male.  HPI     Interim history:   Mr. Pultz is an 85 year-old right-handed woman with an underlying medical history of reflux disease, syncope, sick sinus syndrome, with status post pacemaker placement in February 2017, BPH and mildly overweight state, who presents for follow-up consultation of his obstructive sleep apnea, on treatment with CPAP therapy. The patient is accompanied by his wife today. I last saw him on 01/30/2016, at which time he was compliant with his CPAP. He had had a pacemaker placed recently and his loop recorder was removed.   Today, 09/06/2018: I reviewed his CPAP compliance data from 08/07/2018 through 09/05/2018 which is a total of 30 days, during which time he used his machine 29 days with percent used days greater than 4 hours at 93%, indicating excellent compliance with an average usage of 7 hours and 49 minutes, residual AHI borderline at 5.6 per hour, leak on the low side with the 95th percentile at 5.6 L/m on a pressure of 8 cm. He reports doing well with CPAP. He would like to get a new CPAP machine. He had some recent trouble getting his headgear from his DME company, normally he has been up-to-date with his supplies and they have been reminding him on a regular basis. He is eligible for new machine. Sleep study testing was over 5 years ago. He has been on his CPAP for over 5 years. He uses a medium Quatro full facemask.   Previously:   I saw him on 01/30/2015, at which time he reported doing well. He had no recent syncopal spell. He was compliant with CPAP therapy and I suggested a one-year checkup.   I reviewed his CPAP compliance data from 11/27/2015 through 12/26/2015 which is a total of 30 days during which time he used his machine every night with percent used days greater than 4 hours at 97%, indicating excellent compliance with an average usage for all nights of 7 hours and 26 minutes, residual AHI 3.8  per hour, leak acceptable with the 95th percentile at 12.8 L/m on a pressure of 8 cm.     I saw him on 01/15/2014, at which time he reported using CPAP faithfully. He had no problems with using it. He had a loop recorder in place. He denied any recent palpitations, dizziness, chest pain, shortness of breath, or lightheadedness.   I reviewed his CPAP compliance data from 12/31/2014 through 01/29/2015 which is a total of 30 days during which time he used his machine every night with percent used days greater than 4 hours at 97%, indicating excellent compliance with an average usage of 6 hours and 54 minutes, residual AHI at 2.8 per hour, leak at 2.8 L/m for the 95th percentile which is great, setting of 8 cm with no EPR.   I saw him on 07/17/2013, at which time he reported nausea and an upset stomach (after eating a cheeseburger and fries from [facility] the night prior). Her reported mild, intermittent dizziness, no vertigo. I also reviewed his compliance data from 06/17/2013 through 07/16/2013 which is a total of 30 days during which time he was 100% compliant with treatment with a percent used days greater than 4 hours of 100%, indicating excellent compliance. His residual AHI was 2.5 per hour indicating an appropriate treatment pressure of 8 cm with EPR of 2. His average usage was 7 hours and 59 minutes.    I reviewed his  compliance data from 11/24/2013 through 12/23/2013 which is a total of 30 days during which time he used his CPAP every night, percent used days greater than 4 hours was 93%, indicating excellent compliance, average usage was 6 hours and 35 minutes, residual AHI low at 1.3 per hour and leak acceptable at 19.1 L per minute for the 95th percentile, pressure at 8 cm with EPR of 2.   I saw him on 05/18/2013 for followup of his syncope and collapse as well as after his sleep study. At the time, we discussed all his test results including blood work (ANA, CRP, CBC, CK, hemoglobin A1c, CMP, serum  electrophoresis, RF, RPR, ESR, TSH), echocardiogram, EEG, cardiac event monitor, and MRA neck and head and MRI of brain as well as his sleep study. His labs were essentially unremarkable, with the exception of his hemoglobin A1c at 5.8. His TSH was normal. His MRI brain showed white matter changes, and repeat MRI brain with contrast showed no abnormal contrast enhancement. His MRA neck and head were unremarkable. His EEG was unremarkable. He had a baseline sleep study on 02/08/2013 which demonstrated a total AHI of 12 per hour, a severe REM related OSA with a REM-related AHI of 48 per hour and an oxyhemoglobin desaturation nadir of 83%. Based on those results I asked him to return for CPAP titration study which he had on 03/21/2013, his CPAP was titrated from 5-9 cm with almost complete resolution of his sleep disordered breathing on a pressure of 8 cm. He was placed on CPAP at a pressure of 8 cm with large nasal pillows.    I reviewed his compliance data from 04/17/2013 through 05/17/2013 which is a total of 31 days during which time he used CPAP every day. His percent used days greater than 4 hours was 100% indicating excellent compliance. His average usage was 7 hours and 24 minutes. His leak was not very high. His residual AHI was 2.2 per hour indicating an appropriate treatment pressure of 8 cm of water with EPR of 2.   He had a 30 day Holter monitor which showed no sustained irregularities but some PVCs. Since then, in late September he had a implantable loop recorder placed.   Has been experiencing periods of dizziness and blurry vision as well as nausea and vomiting for the past several years, probably at least 2 years. On 01/12/13 he had parked his car in a parking lot and was on his way to the store and collapsed. He had no warning signs, no palpitations, and claims he had no LOC, no HA, SOB. Some 2 years ago, he had an episode of L facial weakness. No hx of staring spells, no twitching, no shaking, no  automatism, except occasional lip smacking while driving or while eating.   He presented to the emergency room with midsternal chest pain on 06/01/2013 and was admitted for one day with negative cardiac workup. He was scheduled for an outpatient stress test, which he had on 06/07/2013 and it was negative. He has been seen by cardiology outpatient and needs no further w/u from their end at this time and had a loop recorder placed.    His Past Medical History Is Significant For: Past Medical History:  Diagnosis Date  . Chest pain   . Dizziness   . DOE (dyspnea on exertion)   . GERD (gastroesophageal reflux disease)   . History of cardiovascular stress test    ETT-Myoview (06/07/13): No ischemia, EF 56%; normal study.  Marland Kitchen  History of echocardiogram    Echocardiogram (01/2013): EF of 50-55%, normal wall motion, trivial AI, mild MR, mild to moderate LAE  . OSA on CPAP   . Presence of permanent cardiac pacemaker   . SSS (sick sinus syndrome) ([facility] [facility])   . Syncope, near     His Past Surgical History Is Significant For: Past Surgical History:  Procedure Laterality Date  . EP IMPLANTABLE DEVICE N/A 08/23/2015   MDT Advisa DR MRI compatible pacemaker implanted by Dr Rayann Heman for sick sinus and syncope  . EP IMPLANTABLE DEVICE N/A 08/23/2015   Procedure: Loop Recorder Removal;  Surgeon: Thompson Grayer, MD;  Location: [facility] CV LAB;  Service: Cardiovascular;  Laterality: N/A;  . LOOP RECORDER IMPLANT  September 2014  . LOOP RECORDER IMPLANT N/A 04/05/2013   Procedure: LOOP RECORDER IMPLANT;  Surgeon: Coralyn Mark, MD;  Location: [facility] CATH LAB;  Service: Cardiovascular;  Laterality: N/A;    His Family History Is Significant For: Family History  Problem Relation Age of Onset  . CAD Neg Hx     His Social History Is Significant For: Social History   Socioeconomic History  . Marital status: Married    Spouse name: Not on file  . Number of children: 1  . Years of education: Not on file  . Highest  education level: Not on file  Occupational History  . Occupation: 'retired  Scientific laboratory technician  . Financial resource strain: Not on file  . Food insecurity:    Worry: Not on file    Inability: Not on file  . Transportation needs:    Medical: Not on file    Non-medical: Not on file  Tobacco Use  . Smoking status: Former Smoker    Packs/day: 3.00    Years: 30.00    Pack years: 90.00    Types: Cigarettes    Last attempt to quit: 02/01/1982    Years since quitting: 36.6  . Smokeless tobacco: Never Used  Substance and Sexual Activity  . Alcohol use: No    Alcohol/week: 0.0 standard drinks  . Drug use: No  . Sexual activity: Not on file  Lifestyle  . Physical activity:    Days per week: Not on file    Minutes per session: Not on file  . Stress: Not on file  Relationships  . Social connections:    Talks on phone: Not on file    Gets together: Not on file    Attends religious service: Not on file    Active member of club or organization: Not on file    Attends meetings of clubs or organizations: Not on file    Relationship status: Not on file  Other Topics Concern  . Not on file  Social History Narrative  . Not on file    His Allergies Are:  No Known Allergies:   His Current Medications Are:  Outpatient Encounter Medications as of 09/06/2018  Medication Sig  . alfuzosin (UROXATRAL) 10 MG 24 hr tablet Take 10 mg by mouth daily with breakfast.   . cyanocobalamin 2000 MCG tablet Take 2,500 mcg by mouth daily.  Marland Kitchen omeprazole (PRILOSEC) 40 MG capsule Take 40 mg by mouth daily.   No facility-administered encounter medications on file as of 09/06/2018.   :  Review of Systems:  Out of a complete 14 point review of systems, all are reviewed and negative with the exception of these symptoms as listed below: Review of Systems  Neurological:  Pt presents today to discuss his cpap. Pt is interested in a new cpap. Pt uses [facility].     Objective:  Neurological Exam  Physical  Exam Physical Examination:   Vitals:   09/06/18 1521  BP: 121/71  Pulse: 69    General Examination: The patient is a very pleasant 85 y.o. male in no acute distress. He appears well-developed and well-nourished and well groomed.   HEENT: Normocephalic, atraumatic, pupils are equal, round and reactive to light and accommodation. Extraocular tracking is good without limitation to gaze excursion or nystagmus noted. Normal smooth pursuit is noted. Hearing is grossly intact. Face is symmetric, speech is clear with no dysarthria noted. There is no hypophonia. There is no lip, neck/head, jaw or voice tremor. Neck is supple with full range of passive and active motion. There are no carotid bruits on auscultation. Oropharynx exam reveals: mild mouth dryness, adequate dental hygiene and moderate airway crowding. Tongue protrudes centrally and palate elevates symmetrically.   Chest: Clear to auscultation without wheezing, rhonchi or crackles noted.  Heart: S1+S2+0, regular and normal without murmurs, rubs or gallops noted.   Abdomen: Soft, non-tender and non-distended.  Extremities: There is no pitting edema in the distal lower extremities bilaterally.   Skin: Warm and dry without trophic changes noted. He has vitiligo.   Musculoskeletal: exam reveals no obvious joint deformities, tenderness or joint swelling or erythema.   Neurologically:  Mental status: The patient is awake, alert and oriented in all 4 spheres. His memory, attention, language and knowledge are appropriate. There is no aphasia, agnosia, apraxia or anomia. Speech is clear with normal prosody and enunciation. Thought process is linear. Mood is congruent and affect is normal.  Cranial nerves are as described above under HEENT exam. Motor exam: Normal bulk, strength and tone is noted. There is no tremor or rebound. Fine motor skills are grossly intact. Cerebellar testing shows no dysmetria or intention tremor. There is no  truncal or gait ataxia. Sensory exam is intact to light touch in the UEs and LEs.  Gait, station and balance: he stands without difficulty. No veering to one side is noted. No leaning to one side is noted. No dizziness reported. Posture is age-appropriate and stance is narrow based. No problems turning are noted.   Assessment and Plan:   In summary, Colton Bennett is a very pleasant 85 year old male with an an underlying medical history of syncope, sick sinus syndrome with status post pacemaker placement in 2017, who presents for follow-up consultation of his obstructive sleep apnea, well established on treatment with CPAP. He had brain imaging in the past and EEG. He had sleep study testing in 2014. He has been on his current CPAP machine for over 5 years and is eligible for a new machine. Write for new CPAP machine, we will keep the pressure and mask the same. He has done well with a full facemask. He will need a follow-up within 90 days. After that, we can see him on a yearly basis hopefully. I answered all their questions today and the patient and his wife were in agreement. I spent 20 minutes in total face-to-face time with the patient, more than 50% of which was spent in counseling and coordination of care, reviewing test results, reviewing medication and discussing or reviewing the diagnosis of OSA, its prognosis and treatment options. Pertinent laboratory and imaging test results that were available during this visit with the patient were reviewed by me and considered in my medical decision making (  see chart for details).

## 2018-09-13 ENCOUNTER — Ambulatory Visit (INDEPENDENT_AMBULATORY_CARE_PROVIDER_SITE_OTHER): Payer: Medicare Other | Admitting: *Deleted

## 2018-09-13 DIAGNOSIS — I495 Sick sinus syndrome: Secondary | ICD-10-CM

## 2018-09-13 DIAGNOSIS — R55 Syncope and collapse: Secondary | ICD-10-CM

## 2018-09-14 LAB — CUP PACEART REMOTE DEVICE CHECK
Battery Voltage: 3.02 V
Brady Statistic AS VP Percent: 0.04 %
Brady Statistic RA Percent Paced: 0.66 %
Implantable Lead Implant Date: 20170210
Implantable Lead Implant Date: 20170210
Implantable Lead Location: 753860
Implantable Lead Model: 5076
Implantable Lead Model: 5076
Lead Channel Impedance Value: 323 Ohm
Lead Channel Impedance Value: 399 Ohm
Lead Channel Impedance Value: 475 Ohm
Lead Channel Pacing Threshold Amplitude: 0.625 V
Lead Channel Pacing Threshold Amplitude: 0.625 V
Lead Channel Pacing Threshold Pulse Width: 0.4 ms
Lead Channel Pacing Threshold Pulse Width: 0.4 ms
Lead Channel Sensing Intrinsic Amplitude: 0.75 mV
Lead Channel Sensing Intrinsic Amplitude: 0.75 mV
MDC IDC LEAD LOCATION: 753859
MDC IDC MSMT BATTERY REMAINING LONGEVITY: 94 mo
MDC IDC MSMT LEADCHNL RV IMPEDANCE VALUE: 627 Ohm
MDC IDC MSMT LEADCHNL RV SENSING INTR AMPL: 14.25 mV
MDC IDC MSMT LEADCHNL RV SENSING INTR AMPL: 14.25 mV
MDC IDC PG IMPLANT DT: 20170210
MDC IDC SESS DTM: 20200303143109
MDC IDC SET LEADCHNL RA PACING AMPLITUDE: 2 V
MDC IDC SET LEADCHNL RV PACING AMPLITUDE: 2.5 V
MDC IDC SET LEADCHNL RV PACING PULSEWIDTH: 0.4 ms
MDC IDC SET LEADCHNL RV SENSING SENSITIVITY: 2.8 mV
MDC IDC STAT BRADY AP VP PERCENT: 0 %
MDC IDC STAT BRADY AP VS PERCENT: 0.65 %
MDC IDC STAT BRADY AS VS PERCENT: 99.3 %
MDC IDC STAT BRADY RV PERCENT PACED: 0.04 %

## 2018-09-20 NOTE — Progress Notes (Signed)
Remote pacemaker transmission.   

## 2018-11-30 ENCOUNTER — Telehealth: Payer: Self-pay | Admitting: Neurology

## 2018-11-30 NOTE — Telephone Encounter (Signed)
Due to current COVID 19 pandemic, our office is severely reducing in office visits until further notice, in order to minimize the risk to our patients and healthcare providers.   Called patient to offer a virtual visit for his 5/27 appt. Patient declined a virtual visit as he does not have internet access. Patient accepted an in office visit with some precautions. Patient understands that upon arrival he will have his temperature checked/will be screened for COVID. Patient is aware that only one person is allowed in office with him if necessary and will also have temp checked. I advised pt that nurse will call prior to appt to update chart. Pt understands.

## 2018-12-01 NOTE — Telephone Encounter (Signed)
I called pt. The machine set up on 09/20/18 has stopped transmitting data. Pt reports that he uses his cpap every night and will bring it to his appt next week. I will also ask [facility] to check into it. Pt verbalized understanding.

## 2018-12-07 ENCOUNTER — Ambulatory Visit (INDEPENDENT_AMBULATORY_CARE_PROVIDER_SITE_OTHER): Payer: Medicare Other | Admitting: Neurology

## 2018-12-07 ENCOUNTER — Other Ambulatory Visit: Payer: Self-pay

## 2018-12-07 ENCOUNTER — Encounter: Payer: Self-pay | Admitting: Neurology

## 2018-12-07 VITALS — BP 121/67 | HR 69 | Temp 96.9°F | Ht 72.0 in | Wt 199.0 lb

## 2018-12-07 DIAGNOSIS — G4733 Obstructive sleep apnea (adult) (pediatric): Secondary | ICD-10-CM

## 2018-12-07 DIAGNOSIS — Z9989 Dependence on other enabling machines and devices: Secondary | ICD-10-CM

## 2018-12-07 NOTE — Patient Instructions (Signed)
You are doing very well with your new CPAP, keep up the good work! We can see you in 1 year, you can see one of our nurse practitioners as you are stable. I will see you after that.

## 2018-12-07 NOTE — Progress Notes (Signed)
Subjective:    Patient ID: Colton Bennett is a 85 y.o. male.  HPI     Interim history:   Colton Bennett is an 85 year-old right-handed woman with an underlying medical history of reflux disease, syncope, sick sinus syndrome, with status post pacemaker placement in February 2017, BPH and mildly overweight state, who presents for follow-up consultation of his obstructive sleep apnea, on treatment with CPAP therapy, after recent establishment treatment with a new CPAP machine.  The patient is unaccompanied today.  I last saw him on 09/06/2018, at which time he was compliant with his CPAP.  He was eligible for a new machine, sleep study testing was over 5 years ago and his machine was over 41 years old.  He was using a Quattro full facemask.   Today, 12/07/2018: I reviewed his CPAP compliance data from 11/07/2018 through 12/06/2018 which is a total of 30 days, during which time he used his machine every night with percent used days greater than 4 hours at 100%, indicating superb compliance with an average usage of 7 hours and 29 minutes, residual AHI at goal at 3.9/h, leak acceptable with a 95th percentile at 9.1 L/min on a pressure set at 8 cm without EPR.  His set up date was 09/20/2018.   He reports doing well with his new machine, he benefits from treatment, he is completely compliant with it.  He has adapted well to this new machine.  He uses a fullface mask.  He has been using a fullface mask for years.  He is aware of the new style of filter this machine has.  Previously:   I saw him on 01/30/2016, at which time he was compliant with his CPAP. He had had a pacemaker placed recently and his loop recorder was removed.    I reviewed his CPAP compliance data from 08/07/2018 through 09/05/2018 which is a total of 30 days, during which time he used his machine 29 days with percent used days greater than 4 hours at 93%, indicating excellent compliance with an average usage of 7 hours and 49 minutes, residual  AHI borderline at 5.6 per hour, leak on the low side with the 95th percentile at 5.6 L/m on a pressure of 8 cm.   I saw him on 01/30/2015, at which time he reported doing well. He had no recent syncopal spell. He was compliant with CPAP therapy and I suggested a one-year checkup.   I reviewed his CPAP compliance data from 11/27/2015 through 12/26/2015 which is a total of 30 days during which time he used his machine every night with percent used days greater than 4 hours at 97%, indicating excellent compliance with an average usage for all nights of 7 hours and 26 minutes, residual AHI 3.8 per hour, leak acceptable with the 95th percentile at 12.8 L/m on a pressure of 8 cm.    I saw him on 01/15/2014, at which time he reported using CPAP faithfully. He had no problems with using it. He had a loop recorder in place. He denied any recent palpitations, dizziness, chest pain, shortness of breath, or lightheadedness.   I reviewed his CPAP compliance data from 12/31/2014 through 01/29/2015 which is a total of 30 days during which time he used his machine every night with percent used days greater than 4 hours at 97%, indicating excellent compliance with an average usage of 6 hours and 54 minutes, residual AHI at 2.8 per hour, leak at 2.8 L/m for the 95th percentile  which is great, setting of 8 cm with no EPR.   I saw him on 07/17/2013, at which time he reported nausea and an upset stomach (after eating a cheeseburger and fries from [facility] the night prior). Her reported mild, intermittent dizziness, no vertigo. I also reviewed his compliance data from 06/17/2013 through 07/16/2013 which is a total of 30 days during which time he was 100% compliant with treatment with a percent used days greater than 4 hours of 100%, indicating excellent compliance. His residual AHI was 2.5 per hour indicating an appropriate treatment pressure of 8 cm with EPR of 2. His average usage was 7 hours and 59 minutes.    I reviewed  his compliance data from 11/24/2013 through 12/23/2013 which is a total of 30 days during which time he used his CPAP every night, percent used days greater than 4 hours was 93%, indicating excellent compliance, average usage was 6 hours and 35 minutes, residual AHI low at 1.3 per hour and leak acceptable at 19.1 L per minute for the 95th percentile, pressure at 8 cm with EPR of 2.   I saw him on 05/18/2013 for followup of his syncope and collapse as well as after his sleep study. At the time, we discussed all his test results including blood work (ANA, CRP, CBC, CK, hemoglobin A1c, CMP, serum electrophoresis, RF, RPR, ESR, TSH), echocardiogram, EEG, cardiac event monitor, and MRA neck and head and MRI of brain as well as his sleep study. His labs were essentially unremarkable, with the exception of his hemoglobin A1c at 5.8. His TSH was normal. His MRI brain showed white matter changes, and repeat MRI brain with contrast showed no abnormal contrast enhancement. His MRA neck and head were unremarkable. His EEG was unremarkable. He had a baseline sleep study on 02/08/2013 which demonstrated a total AHI of 12 per hour, a severe REM related OSA with a REM-related AHI of 48 per hour and an oxyhemoglobin desaturation nadir of 83%. Based on those results I asked him to return for CPAP titration study which he had on 03/21/2013, his CPAP was titrated from 5-9 cm with almost complete resolution of his sleep disordered breathing on a pressure of 8 cm. He was placed on CPAP at a pressure of 8 cm with large nasal pillows.    I reviewed his compliance data from 04/17/2013 through 05/17/2013 which is a total of 31 days during which time he used CPAP every day. His percent used days greater than 4 hours was 100% indicating excellent compliance. His average usage was 7 hours and 24 minutes. His leak was not very high. His residual AHI was 2.2 per hour indicating an appropriate treatment pressure of 8 cm of water with EPR of 2.    He had a 30 day Holter monitor which showed no sustained irregularities but some PVCs. Since then, in late September he had a implantable loop recorder placed.   Has been experiencing periods of dizziness and blurry vision as well as nausea and vomiting for the past several years, probably at least 2 years. On 01/12/13 he had parked his car in a parking lot and was on his way to the store and collapsed. He had no warning signs, no palpitations, and claims he had no LOC, no HA, SOB. Some 2 years ago, he had an episode of L facial weakness. No hx of staring spells, no twitching, no shaking, no automatism, except occasional lip smacking while driving or while eating.   He presented  to the emergency room with midsternal chest pain on 06/01/2013 and was admitted for one day with negative cardiac workup. He was scheduled for an outpatient stress test, which he had on 06/07/2013 and it was negative. He has been seen by cardiology outpatient and needs no further w/u from their end at this time and had a loop recorder placed.    Her Past Medical History Is Significant For: Past Medical History:  Diagnosis Date  . Chest pain   . Dizziness   . DOE (dyspnea on exertion)   . GERD (gastroesophageal reflux disease)   . History of cardiovascular stress test    ETT-Myoview (06/07/13): No ischemia, EF 56%; normal study.  . History of echocardiogram    Echocardiogram (01/2013): EF of 50-55%, normal wall motion, trivial AI, mild MR, mild to moderate LAE  . OSA on CPAP   . Presence of permanent cardiac pacemaker   . SSS (sick sinus syndrome) ([place])   . Syncope, near     His Past Surgical History Is Significant For: Past Surgical History:  Procedure Laterality Date  . EP IMPLANTABLE DEVICE N/A 08/23/2015   MDT Advisa DR MRI compatible pacemaker implanted by Dr Rayann Heman for sick sinus and syncope  . EP IMPLANTABLE DEVICE N/A 08/23/2015   Procedure: Loop Recorder Removal;  Surgeon: Thompson Grayer, MD;  Location: [facility];  Service: Cardiovascular;  Laterality: N/A;  . LOOP RECORDER IMPLANT  September 2014  . LOOP RECORDER IMPLANT N/A 04/05/2013   Procedure: LOOP RECORDER IMPLANT;  Surgeon: Coralyn Mark, MD;  Location: [facility];  Service: Cardiovascular;  Laterality: N/A;    His Family History Is Significant For: Family History  Problem Relation Age of Onset  . CAD Neg Hx     His Social History Is Significant For: Social History   Socioeconomic History  . Marital status: Married    Spouse name: Not on file  . Number of children: 1  . Years of education: Not on file  . Highest education level: Not on file  Occupational History  . Occupation: 'retired  Scientific laboratory technician  . Financial resource strain: Not on file  . Food insecurity:    Worry: Not on file    Inability: Not on file  . Transportation needs:    Medical: Not on file    Non-medical: Not on file  Tobacco Use  . Smoking status: Former Smoker    Packs/day: 3.00    Years: 30.00    Pack years: 90.00    Types: Cigarettes    Last attempt to quit: 02/01/1982    Years since quitting: 36.8  . Smokeless tobacco: Never Used  Substance and Sexual Activity  . Alcohol use: No    Alcohol/week: 0.0 standard drinks  . Drug use: No  . Sexual activity: Not on file  Lifestyle  . Physical activity:    Days per week: Not on file    Minutes per session: Not on file  . Stress: Not on file  Relationships  . Social connections:    Talks on phone: Not on file    Gets together: Not on file    Attends religious service: Not on file    Active member of club or organization: Not on file    Attends meetings of clubs or organizations: Not on file    Relationship status: Not on file  Other Topics Concern  . Not on file  Social History Narrative  . Not on file  His Allergies Are:  No Known Allergies:   His Current Medications Are:  Outpatient Encounter Medications as of 12/07/2018  Medication Sig  . alfuzosin (UROXATRAL) 10 MG  24 hr tablet Take 10 mg by mouth daily with breakfast.   . cyanocobalamin 2000 MCG tablet Take 2,500 mcg by mouth daily.  Marland Kitchen omeprazole (PRILOSEC) 40 MG capsule Take 40 mg by mouth daily.   No facility-administered encounter medications on file as of 12/07/2018.   :  Review of Systems:  Out of a complete 14 point review of systems, all are reviewed and negative with the exception of these symptoms as listed below:  Review of Systems  Neurological:       Pt presents today to follow up on his cpap. He reports that it is going well.    Objective:  Neurological Exam  Physical Exam Physical Examination:   Vitals:   12/07/18 1302  BP: 121/67  Pulse: 69  Temp: (!) 96.9 F (36.1 C)    General Examination: The patient is a very pleasant 85 y.o. male in no acute distress. He appears well-developed and well-nourished and well groomed.   HEENT:Normocephalic, atraumatic, pupils are equal, round and reactive to light and accommodation. Extraocular tracking is good without limitation to gaze excursion or nystagmus noted. Normal smooth pursuit is noted. Hearing is grossly intact. Face is symmetric, speech is clear with no dysarthria noted. There is no hypophonia. There is no lip, neck/head, jaw or voice tremor. Neck shows FROM. There are no carotid bruits on auscultation. Oropharynx exam reveals: moderate mouth dryness, adequate dental hygiene and moderate airway crowding. Tongue protrudes centrally and palate elevates symmetrically.   Chest:Clear to auscultation without wheezing, rhonchi or crackles noted.  Heart:S1+S2+0, regular and normal without murmurs, rubs or gallops noted.   Abdomen:Soft, non-tender and non-distended.  Extremities:There is no pitting edema in the distal lower extremities bilaterally.   Skin: Warm and dry without trophic changes noted. He has vitiligo.   Musculoskeletal: exam reveals no obvious joint deformities, tenderness or joint swelling or erythema.    Neurologically:  Mental status: The patient is awake, alert and oriented in all 4 spheres. His memory, attention, language and knowledge are appropriate. There is no aphasia, agnosia, apraxia or anomia. Speech is clear with normal prosody and enunciation. Thought process is linear. Mood is congruent and affect is normal.  Cranial nerves are as described above under HEENT exam. Motor exam: Normal bulk, strength and tone is noted. There is no tremor or rebound. Fine motor skills are grossly intact. Cerebellar testing shows no dysmetria or intention tremor. There is no truncal or gait ataxia. Sensory exam is intact to light touch in the UEs and LEs.  Gait, station and balance: he stands without difficulty. No veering to one side is noted. No leaning to one side is noted. No dizziness reported. Posture is age-appropriate and stance is narrow based. No problems turning are noted.   Assessment and Plan:   In summary, Colton Bennett is a very pleasant 85 year old male with an an underlying medical history of syncope, sick sinus syndrome with status post pacemaker placement in 2017, who presents for follow-up consultation of his obstructive sleep apnea, With a longstanding diagnosis since 2014 and now establish on a new CPAP machine since March 2020.  He is fully compliant with treatment, settings are adequate as far as apnea control, he tolerates the treatment and benefits from it. He had brain imaging in the past and EEG. He had sleep study  testing in 2014. He has been Fully compliant with CPAP and is commended for it. We can see him on a yearly basis hopefully. I suggested a follow-up in 1 year with 1 of our nurse practitioners.  I answered all his questions today and he was in agreement. I spent 15 minutes in total face-to-face time with the patient, more than 50% of which was spent in counseling and coordination of care, reviewing test results, reviewing medication and discussing or reviewing the  diagnosis of OSA, its prognosis and treatment options. Pertinent laboratory and imaging test results that were available during this visit with the patient were reviewed by me and considered in my medical decision making (see chart for details).

## 2018-12-13 ENCOUNTER — Telehealth: Payer: Self-pay

## 2018-12-13 ENCOUNTER — Ambulatory Visit (INDEPENDENT_AMBULATORY_CARE_PROVIDER_SITE_OTHER): Payer: Medicare Other | Admitting: *Deleted

## 2018-12-13 DIAGNOSIS — I495 Sick sinus syndrome: Secondary | ICD-10-CM

## 2018-12-13 NOTE — Telephone Encounter (Signed)
I called and left patient a message about changing office visit on 12/29/18 to telehealth visit. 

## 2018-12-14 LAB — CUP PACEART REMOTE DEVICE CHECK
Battery Remaining Longevity: 88 mo
Battery Voltage: 3.01 V
Brady Statistic AP VP Percent: 0.01 %
Brady Statistic AP VS Percent: 2.17 %
Brady Statistic AS VP Percent: 0.04 %
Brady Statistic AS VS Percent: 97.78 %
Brady Statistic RA Percent Paced: 2.18 %
Brady Statistic RV Percent Paced: 0.05 %
Date Time Interrogation Session: 20200602120335
Implantable Lead Implant Date: 20170210
Implantable Lead Implant Date: 20170210
Implantable Lead Location: 753859
Implantable Lead Location: 753860
Implantable Lead Model: 5076
Implantable Lead Model: 5076
Implantable Pulse Generator Implant Date: 20170210
Lead Channel Impedance Value: 304 Ohm
Lead Channel Impedance Value: 380 Ohm
Lead Channel Impedance Value: 437 Ohm
Lead Channel Impedance Value: 570 Ohm
Lead Channel Pacing Threshold Amplitude: 0.5 V
Lead Channel Pacing Threshold Amplitude: 0.625 V
Lead Channel Pacing Threshold Pulse Width: 0.4 ms
Lead Channel Pacing Threshold Pulse Width: 0.4 ms
Lead Channel Sensing Intrinsic Amplitude: 1 mV
Lead Channel Sensing Intrinsic Amplitude: 1 mV
Lead Channel Sensing Intrinsic Amplitude: 13.25 mV
Lead Channel Sensing Intrinsic Amplitude: 13.25 mV
Lead Channel Setting Pacing Amplitude: 2 V
Lead Channel Setting Pacing Amplitude: 2.5 V
Lead Channel Setting Pacing Pulse Width: 0.4 ms
Lead Channel Setting Sensing Sensitivity: 2.8 mV

## 2018-12-16 NOTE — Telephone Encounter (Signed)
Virtual Visit Pre-Appointment Phone Call  "(Name), I am calling you today to discuss your upcoming appointment. We are currently trying to limit exposure to the virus that causes COVID-19 by seeing patients at home rather than in the office."  1. "What is the BEST phone number to call the day of the visit?" - include this in appointment notes  2. "Do you have or have access to (through a family member/friend) a smartphone with video capability that we can use for your visit?" a. If yes - list this number in appt notes as "cell" (if different from BEST phone #) and list the appointment type as a VIDEO visit in appointment notes b. If no - list the appointment type as a PHONE visit in appointment notes  3. Confirm consent - "In the setting of the current Covid19 crisis, you are scheduled for a (phone or video) visit with your provider on (date) at (time).  Just as we do with many in-office visits, in order for you to participate in this visit, we must obtain consent.  If you'd like, I can send this to your mychart (if signed up) or email for you to review.  Otherwise, I can obtain your verbal consent now.  All virtual visits are billed to your insurance company just like a normal visit would be.  By agreeing to a virtual visit, we'd like you to understand that the technology does not allow for your provider to perform an examination, and thus may limit your provider's ability to fully assess your condition. If your provider identifies any concerns that need to be evaluated in person, we will make arrangements to do so.  Finally, though the technology is pretty good, we cannot assure that it will always work on either your or our end, and in the setting of a video visit, we may have to convert it to a phone-only visit.  In either situation, we cannot ensure that we have a secure connection.  Are you willing to proceed?" STAFF: Did the patient verbally acknowledge consent to telehealth visit? Document  YES/NO here: YES  4. Advise patient to be prepared - "Two hours prior to your appointment, go ahead and check your blood pressure, pulse, oxygen saturation, and your weight (if you have the equipment to check those) and write them all down. When your visit starts, your provider will ask you for this information. If you have an Apple Watch or Kardia device, please plan to have heart rate information ready on the day of your appointment. Please have a pen and paper handy nearby the day of the visit as well."  5. Give patient instructions for MyChart download to smartphone OR [email or web address] as below if video visit (depending on what platform provider is using)  6. Inform patient they will receive a phone call 15 minutes prior to their appointment time (may be from unknown caller ID) so they should be prepared to answer    TELEPHONE CALL NOTE  Colton Bennett has been deemed a candidate for a follow-up tele-health visit to limit community exposure during the Covid-19 pandemic. I spoke with the patient via phone to ensure availability of phone/video source, confirm preferred email & phone number, and discuss instructions and expectations.  I reminded Colton Bennett to be prepared with any vital sign and/or heart rhythm information that could potentially be obtained via home monitoring, at the time of his visit. I reminded Colton Bennett to expect a phone call prior to  his visit.  Lajoyce Corners, CMA 12/16/2018 11:49 AM   INSTRUCTIONS FOR DOWNLOADING THE MYCHART APP TO SMARTPHONE  - The patient must first make sure to have activated MyChart and know their login information - If Apple, go to [employer] and type in MyChart in the search bar and download the app. If Android, ask patient to go to [employer] and type in [place] in the search bar and download the app. The app is free but as with any other app downloads, their phone may require them to verify saved payment information or Apple/Android  password.  - The patient will need to then log into the app with their MyChart username and password, and select [facility] as their healthcare provider to link the account. When it is time for your visit, go to the MyChart app, find appointments, and click Begin Video Visit. Be sure to Select Allow for your device to access the Microphone and Camera for your visit. You will then be connected, and your provider will be with you shortly.  **If they have any issues connecting, or need assistance please contact MyChart service desk [phone])**  **If using a computer, in order to ensure the best quality for their visit they will need to use either of the following Internet Browsers: [employer] or Google Chrome**  IF USING DOXIMITY or DOXY.ME - The patient will receive a link just prior to their visit by text.     FULL LENGTH CONSENT FOR TELE-HEALTH VISIT   I hereby voluntarily request, consent and authorize [facility] and its employed or contracted physicians, physician assistants, nurse practitioners or other licensed health care professionals (the Practitioner), to provide me with telemedicine health care services (the "Services") as deemed necessary by the treating Practitioner. I acknowledge and consent to receive the Services by the Practitioner via telemedicine. I understand that the telemedicine visit will involve communicating with the Practitioner through live audiovisual communication technology and the disclosure of certain medical information by electronic transmission. I acknowledge that I have been given the opportunity to request an in-person assessment or other available alternative prior to the telemedicine visit and am voluntarily participating in the telemedicine visit.  I understand that I have the right to withhold or withdraw my consent to the use of telemedicine in the course of my care at any time, without affecting my right to future care or treatment,  and that the Practitioner or I may terminate the telemedicine visit at any time. I understand that I have the right to inspect all information obtained and/or recorded in the course of the telemedicine visit and may receive copies of available information for a reasonable fee.  I understand that some of the potential risks of receiving the Services via telemedicine include:  Marland Kitchen Delay or interruption in medical evaluation due to technological equipment failure or disruption; . Information transmitted may not be sufficient (e.g. poor resolution of images) to allow for appropriate medical decision making by the Practitioner; and/or  . In rare instances, security protocols could fail, causing a breach of personal health information.  Furthermore, I acknowledge that it is my responsibility to provide information about my medical history, conditions and care that is complete and accurate to the best of my ability. I acknowledge that Practitioner's advice, recommendations, and/or decision may be based on factors not within their control, such as incomplete or inaccurate data provided by me or distortions of diagnostic images or specimens that may result from electronic transmissions.  I understand that the practice of medicine is not an exact science and that Practitioner makes no warranties or guarantees regarding treatment outcomes. I acknowledge that I will receive a copy of this consent concurrently upon execution via email to the email address I last provided but may also request a printed copy by calling the office of [facility].    I understand that my insurance will be billed for this visit.   I have read or had this consent read to me. . I understand the contents of this consent, which adequately explains the benefits and risks of the Services being provided via telemedicine.  . I have been provided ample opportunity to ask questions regarding this consent and the Services and have had my questions  answered to my satisfaction. . I give my informed consent for the services to be provided through the use of telemedicine in my medical care  By participating in this telemedicine visit I agree to the above.

## 2018-12-21 ENCOUNTER — Encounter: Payer: Self-pay | Admitting: Cardiology

## 2018-12-21 NOTE — Progress Notes (Signed)
Remote pacemaker transmission.   

## 2018-12-29 ENCOUNTER — Encounter: Payer: Self-pay | Admitting: Internal Medicine

## 2018-12-29 ENCOUNTER — Telehealth (INDEPENDENT_AMBULATORY_CARE_PROVIDER_SITE_OTHER): Payer: Medicare Other | Admitting: Internal Medicine

## 2018-12-29 ENCOUNTER — Other Ambulatory Visit: Payer: Self-pay

## 2018-12-29 VITALS — BP 122/64 | HR 74 | Temp 98.0°F | Ht 72.0 in | Wt 202.0 lb

## 2018-12-29 DIAGNOSIS — I495 Sick sinus syndrome: Secondary | ICD-10-CM

## 2018-12-29 DIAGNOSIS — Z950 Presence of cardiac pacemaker: Secondary | ICD-10-CM

## 2018-12-29 NOTE — Progress Notes (Signed)
Electrophysiology TeleHealth Note   Due to national recommendations of social distancing due to COVID 19, an audio/video telehealth visit is felt to be most appropriate for this patient at this time.  See MyChart message from today for the patient's consent to telehealth for [facility].   Date:  12/29/2018   ID:  Colton CriglerAlton F Bennett, DOB 07/11/1934, MRN [id number]  Location: patient's home  Provider location: [address], [place]  Evaluation Performed: Follow-up visit  PCP:  Colton Bennett, Leonard, MD  Cardiologist:   Colton Bennett- has suspended his involvement  Electrophysiologist:  Colton Bennett  Chief Complaint:  syncope  History of Present Illness:    Colton Bennett is a 85 y.o. male who presents via audio/video conferencing for a telehealth visit today.The patient did not have access to video technology/had technical difficulties with video requiring transitioning to audio format only (telephone).  All issues noted in this document were discussed and addressed.  No physical exam could be performed with this format.      Since last being seen in our clinic, the patient reports doing well  Syncope 2014; ILR implanted with documented symptoms and bradycardia  2017  Medtronic pacemaker insertion  No recurrent syncope  The patient denies chest pain, shortness of breath, nocturnal dyspnea, orthopnea or peripheral edema.  There have been no palpitations, lightheadedness or syncope.      The patient denies symptoms of fevers, chills, cough, or new SOB worrisome for COVID 19.    Past Medical History:  Diagnosis Date  . Chest pain   . Dizziness   . DOE (dyspnea on exertion)   . GERD (gastroesophageal reflux disease)   . History of cardiovascular stress test    ETT-Myoview (06/07/13): No ischemia, EF 56%; normal study.  . History of echocardiogram    Echocardiogram (01/2013): EF of 50-55%, normal wall motion, trivial AI, mild MR, mild to moderate LAE  . OSA on CPAP   . Presence of permanent  cardiac pacemaker   . SSS (sick sinus syndrome) (HCC)   . Syncope, near     Past Surgical History:  Procedure Laterality Date  . EP IMPLANTABLE DEVICE N/A 08/23/2015   MDT Advisa DR MRI compatible pacemaker implanted by Dr Johney FrameAllred for sick sinus and syncope  . EP IMPLANTABLE DEVICE N/A 08/23/2015   Procedure: Loop Recorder Removal;  Surgeon: Colton RangeJames Allred, MD;  [facility];  Service: Cardiovascular;  Laterality: N/A;  . LOOP RECORDER IMPLANT  September 2014  . LOOP RECORDER IMPLANT N/A 04/05/2013   Procedure: LOOP RECORDER IMPLANT;  Surgeon: Colton RhymeJames D Allred, MD;  Location: [facility] CATH LAB;  Service: Cardiovascular;  Laterality: N/A;    Current Outpatient Medications  Medication Sig Dispense Refill  . alfuzosin (UROXATRAL) 10 MG 24 hr tablet Take 10 mg by mouth daily with breakfast.     . cyanocobalamin 2000 MCG tablet Take 2,500 mcg by mouth daily.    Marland Kitchen. omeprazole (PRILOSEC) 40 MG capsule Take 40 mg by mouth daily.     No current facility-administered medications for this visit.     Allergies:   Patient has no known allergies.   Social History:  The patient  reports that he quit smoking about 36 years ago. His smoking use included cigarettes. He has a 90.00 pack-year smoking history. He has never used smokeless tobacco. He reports that he does not drink alcohol or use drugs.   Family History:  The patient's   family history is not on  file.   ROS:  Please see the history of present illness.   All other systems are personally reviewed and negative.    Exam:    Vital Signs:  BP 122/64   Pulse 74   Temp 98 F (36.7 C)   Ht 6' (1.829 m)   Wt 202 lb (91.6 kg)   BMI 27.40 kg/m   No shortness of breath with conversation      Labs/Other Tests and Data Reviewed:    Recent Labs: No results found for requested labs within last 8760 hours.   Wt Readings from Last 3 Encounters:  12/29/18 202 lb (91.6 kg)  12/07/18 199 lb (90.3 kg)  09/06/18 204 lb (92.5 kg)      Other studies personally reviewed: Additional studies/ records that were reviewed today include:    Last device remote is reviewed from [place] PDF dated 6/20 * which reveals normal device function,   arrhythmias - none     ASSESSMENT & PLAN:    Syncope  Sinus Node dysfunction  Pacemaker Medtronic    Doing well contineu current meds  Will arrange followup with Dr Greggory Bennett as this visit was made by mistake    COVID 19 screen The patient denies symptoms of COVID 19 at this time.  The importance of social distancing was discussed today.  Follow-up:  25 m Colton Bennett Next remote: As Scheduled   Current medicines are reviewed at length with the patient today.   The patient does not have concerns regarding his medicines.  The following changes were made today:  none  Labs/ tests ordered today include:   No orders of the defined types were placed in this encounter.   Future tests ( post COVID )    Patient Risk:  after full review of this patients clinical status, I feel that they are at moderate risk at this time.  Today, I have spent 8 minutes with the patient with telehealth technology discussing the above.  Signed, Virl Axe, MD  12/29/2018 3:44 PM     [facility] [address] [place] [address] [phone] (office) [phone] (fax)

## 2019-03-14 ENCOUNTER — Ambulatory Visit (INDEPENDENT_AMBULATORY_CARE_PROVIDER_SITE_OTHER): Payer: Medicare Other | Admitting: *Deleted

## 2019-03-14 DIAGNOSIS — I495 Sick sinus syndrome: Secondary | ICD-10-CM

## 2019-03-16 LAB — CUP PACEART REMOTE DEVICE CHECK
Battery Remaining Longevity: 86 mo
Battery Voltage: 3.01 V
Brady Statistic AP VP Percent: 0.01 %
Brady Statistic AP VS Percent: 5.06 %
Brady Statistic AS VP Percent: 0.04 %
Brady Statistic AS VS Percent: 94.89 %
Brady Statistic RA Percent Paced: 5.07 %
Brady Statistic RV Percent Paced: 0.05 %
Date Time Interrogation Session: 20200902135123
Implantable Lead Implant Date: 20170210
Implantable Lead Implant Date: 20170210
Implantable Lead Location: 753859
Implantable Lead Location: 753860
Implantable Lead Model: 5076
Implantable Lead Model: 5076
Implantable Pulse Generator Implant Date: 20170210
Lead Channel Impedance Value: 323 Ohm
Lead Channel Impedance Value: 418 Ohm
Lead Channel Impedance Value: 437 Ohm
Lead Channel Impedance Value: 532 Ohm
Lead Channel Pacing Threshold Amplitude: 0.625 V
Lead Channel Pacing Threshold Amplitude: 0.625 V
Lead Channel Pacing Threshold Pulse Width: 0.4 ms
Lead Channel Pacing Threshold Pulse Width: 0.4 ms
Lead Channel Sensing Intrinsic Amplitude: 1.125 mV
Lead Channel Sensing Intrinsic Amplitude: 1.125 mV
Lead Channel Sensing Intrinsic Amplitude: 14 mV
Lead Channel Sensing Intrinsic Amplitude: 14 mV
Lead Channel Setting Pacing Amplitude: 2 V
Lead Channel Setting Pacing Amplitude: 2.5 V
Lead Channel Setting Pacing Pulse Width: 0.4 ms
Lead Channel Setting Sensing Sensitivity: 2.8 mV

## 2019-03-28 NOTE — Progress Notes (Signed)
Remote pacemaker transmission.   

## 2019-05-16 ENCOUNTER — Encounter: Payer: Self-pay | Admitting: Cardiology

## 2019-05-16 ENCOUNTER — Ambulatory Visit (INDEPENDENT_AMBULATORY_CARE_PROVIDER_SITE_OTHER): Payer: Medicare Other | Admitting: Cardiology

## 2019-05-16 ENCOUNTER — Other Ambulatory Visit: Payer: Self-pay

## 2019-05-16 VITALS — BP 128/62 | HR 66 | Ht 72.0 in | Wt 204.0 lb

## 2019-05-16 DIAGNOSIS — R079 Chest pain, unspecified: Secondary | ICD-10-CM

## 2019-05-16 DIAGNOSIS — Z950 Presence of cardiac pacemaker: Secondary | ICD-10-CM

## 2019-05-16 NOTE — Progress Notes (Signed)
Cardiology Office Note:    Date:  05/16/2019   ID:  Colton Bennett, DOB [birth year], MRN [id number]  PCP:  Curly Rim, MD  Cardiologist:  Mertie Moores, MD  Electrophysiologist: Thompson Grayer, MD  Referring MD: Dione Housekeeper, MD   Chief Complaint  Patient presents with  . Chest Pain    History of Present Illness:    Colton Bennett is a 85 y.o. male with a past medical history significant for sick sinus syndrome s/p permanent pacemaker placed 2017, dyspnea on exertion, OSA on CPAP, near syncope in 2015.  Patient was to be followed by EP and only by Dr. Acie Fredrickson as needed.  Colton Bennett is here today for evaluation of chest discomfort. He has had about 3 weeks of left mild chest discomfort that he felt was just enough to know that there was something new, but he is unable to qualify it as aching or pressure. This could occur with actitivy or at rest, fishing or sitting in his recliner. It has been intermittent and lasting about a few minutes. He has had no shortness of breath associated. This was happening several times a day but has been decreasing to a couple of times per day now.   He has been fishing a couple of times last week. He also does some yardwork. He has not been going to he gym due to the pandemic. He picked up some limbs yesterday and had some mild chest discomfort with that but no shortness of breath.   No orthopnea, PND or edema. No palpitations or lightheadedness.  The patient uses his CPAP every night.  Last stress test was in 2014.   Cardiac studies   No recent studies  Past Medical History:  Diagnosis Date  . Chest pain   . Dizziness   . DOE (dyspnea on exertion)   . GERD (gastroesophageal reflux disease)   . History of cardiovascular stress test    ETT-Myoview (06/07/13): No ischemia, EF 56%; normal study.  . History of echocardiogram    Echocardiogram (01/2013): EF of 50-55%, normal wall motion, trivial AI, mild MR, mild to moderate LAE  . OSA on CPAP    . Presence of permanent cardiac pacemaker   . SSS (sick sinus syndrome) ([place])   . Syncope, near     Past Surgical History:  Procedure Laterality Date  . EP IMPLANTABLE DEVICE N/A 08/23/2015   MDT Advisa DR MRI compatible pacemaker implanted by Dr Rayann Heman for sick sinus and syncope  . EP IMPLANTABLE DEVICE N/A 08/23/2015   Procedure: Loop Recorder Removal;  Surgeon: Thompson Grayer, MD;  Location: [facility];  Service: Cardiovascular;  Laterality: N/A;  . LOOP RECORDER IMPLANT  September 2014  . LOOP RECORDER IMPLANT N/A 04/05/2013   Procedure: LOOP RECORDER IMPLANT;  Surgeon: Coralyn Mark, MD;  Location: [facility]ATH [facility];  Service: Cardiovascular;  Laterality: N/A;    Current Medications: Current Meds  Medication Sig  . alfuzosin (UROXATRAL) 10 MG 24 hr tablet Take 10 mg by mouth daily with breakfast.   . cyanocobalamin 2000 MCG tablet Take 2,500 mcg by mouth daily.  Marland Kitchen omeprazole (PRILOSEC) 40 MG capsule Take 40 mg by mouth daily.     Allergies:   Patient has no known allergies.   Social History   Socioeconomic History  . Marital status: Married    Spouse name: Not on file  . Number of children: 1  . Years of education: Not on file  . Highest education  level: Not on file  Occupational History  . Occupation: 'retired  Engineer, productionocial Needs  . Financial resource strain: Not on file  . Food insecurity    Worry: Not on file    Inability: Not on file  . Transportation needs    Medical: Not on file    Non-medical: Not on file  Tobacco Use  . Smoking status: Former Smoker    Packs/day: 3.00    Years: 30.00    Pack years: 90.00    Types: Cigarettes    Quit date: 02/01/1982    Years since quitting: 37.3  . Smokeless tobacco: Never Used  Substance and Sexual Activity  . Alcohol use: No    Alcohol/week: 0.0 standard drinks  . Drug use: No  . Sexual activity: Not on file  Lifestyle  . Physical activity    Days per week: Not on file    Minutes per session: Not on file  . Stress:  Not on file  Relationships  . Social Musicianconnections    Talks on phone: Not on file    Gets together: Not on file    Attends religious service: Not on file    Active member of club or organization: Not on file    Attends meetings of clubs or organizations: Not on file    Relationship status: Not on file  Other Topics Concern  . Not on file  Social History Narrative  . Not on file     Family History: The patient's family history includes Dementia in his brother; Lung cancer in his brother; Lymphoma in his brother; Other in his father; Suicidality in his brother. There is no history of CAD. ROS:   Please see the history of present illness.     All other systems reviewed and are negative.   EKG:  EKG is  ordered today.  The ekg ordered today demonstrates NSR, 66 bpm, no significant changes since prior.   Recent Labs: No results found for requested labs within last 8760 hours.   Recent Lipid Panel No results found for: CHOL, TRIG, HDL, CHOLHDL, VLDL, LDLCALC, LDLDIRECT  Physical Exam:    VS:  BP 128/62   Pulse 66   Ht 6' (1.829 m)   Wt 204 lb (92.5 kg)   SpO2 96%   BMI 27.67 kg/m     Wt Readings from Last 6 Encounters:  05/16/19 204 lb (92.5 kg)  12/29/18 202 lb (91.6 kg)  12/07/18 199 lb (90.3 kg)  09/06/18 204 lb (92.5 kg)  04/05/18 203 lb 6.4 oz (92.3 kg)  12/20/17 213 lb (96.6 kg)     Physical Exam  Constitutional: He is oriented to person, place, and time. He appears well-developed and well-nourished. No distress.  HENT:  Head: Normocephalic.  Neck: Normal range of motion. Neck supple. No JVD present.  Cardiovascular: Normal rate, regular rhythm, normal heart sounds and intact distal pulses. Exam reveals no gallop and no friction rub.  No murmur heard. Pulmonary/Chest: Effort normal and breath sounds normal. No respiratory distress. He has no wheezes. He has no rales.  Abdominal: Soft. Bowel sounds are normal.  Musculoskeletal: Normal range of motion.         General: No edema.  Neurological: He is alert and oriented to person, place, and time.  Skin: Skin is warm and dry.  Psychiatric: He has a normal mood and affect. His behavior is normal. Judgment and thought content normal.  Vitals reviewed.   ASSESSMENT:    1. Chest pain  of uncertain etiology   2. Cardiac pacemaker in situ    PLAN:    In order of problems listed above:  Chest pain, mostly atypical -For the last 3 weeks, mild left chest intermittent discomfort, but related to activity. Pt unable to descirbe other than, mild, intermittent lasting a few minutes and not associated with any other symptoms.  The patient felt that this was new for him and that he should be checked out. -EKG without ischemic changes and physical exam is normal.  No heart failure type symptoms. -Will check a lexiscan myoview.  If his study has high risk features, he would likely need a cardiac catheterization to further evaluate. -I advised the patient that even if his study is normal, he should contact us if his discomfort becomes more bothersome.  History of sick sinus syndrome -Patient had permanent pacemaker placed in 2017 -Followed by EP, Dr. Johney Frame.    Medication Adjustments/Labs and Tests Ordered: Current medicines are reviewed at length with the patient today.  Concerns regarding medicines are outlined above. Labs and tests ordered and medication changes are outlined in the patient instructions below:  Patient Instructions  Medication Instructions:  Your physician recommends that you continue on your current medications as directed. Please refer to the Current Medication list given to you today.  *If you need a refill on your cardiac medications before your next appointment, please call your pharmacy*  Lab Work: None   If you have labs (blood work) drawn today and your tests are completely normal, you will receive your results only by: Marland Kitchen MyChart Message (if you have MyChart) OR . A paper copy  in the mail If you have any lab test that is abnormal or we need to change your treatment, we will call you to review the results.  Testing/Procedures: Your physician has requested that you have a lexiscan myoview. For further information please visit [email or web address] Please follow instruction sheet, as given.    Follow-Up: At [facility], you and your health needs are our priority.  As part of our continuing mission to provide you with exceptional heart care, we have created designated Provider Care Teams.  These Care Teams include your primary Cardiologist (physician) and Advanced Practice Providers (APPs -  Physician Assistants and Nurse Practitioners) who all work together to provide you with the care you need, when you need it.  Your next appointment:   3 months  The format for your next appointment:   Either In Person or Virtual  Provider:   You may see Kristeen Miss, MD or one of the following Advanced Practice Providers on your designated Care Team:    Tereso Newcomer, PA-C  Vin Marceline, [place]  Berton Bon, NP   Other Instructions      Signed, Berton Bon, NP  05/16/2019 3:34 PM    [facility]

## 2019-05-16 NOTE — Patient Instructions (Signed)
Medication Instructions:  Your physician recommends that you continue on your current medications as directed. Please refer to the Current Medication list given to you today.  *If you need a refill on your cardiac medications before your next appointment, please call your pharmacy*  Lab Work: None   If you have labs (blood work) drawn today and your tests are completely normal, you will receive your results only by: Marland Kitchen MyChart Message (if you have MyChart) OR . A paper copy in the mail If you have any lab test that is abnormal or we need to change your treatment, we will call you to review the results.  Testing/Procedures: Your physician has requested that you have a lexiscan myoview. For further information please visit [email or web address]. Please follow instruction sheet, as given.    Follow-Up: At [facility], you and your health needs are our priority.  As part of our continuing mission to provide you with exceptional heart care, we have created designated Provider Care Teams.  These Care Teams include your primary Cardiologist (physician) and Advanced Practice Providers (APPs -  Physician Assistants and Nurse Practitioners) who all work together to provide you with the care you need, when you need it.  Your next appointment:   3 months  The format for your next appointment:   Either In Person or Virtual  Provider:   You may see Mertie Moores, MD or one of the following Advanced Practice Providers on your designated Care Team:    Richardson Dopp, PA-C  [place], [place]  Daune Perch, NP   Other Instructions

## 2019-05-23 ENCOUNTER — Telehealth (HOSPITAL_COMMUNITY): Payer: Self-pay

## 2019-05-23 NOTE — Telephone Encounter (Signed)
Spoke with the patient's wife. She wrote his instructions down, and stated that he would be here for his test. Asked to call back with any questions. S.Jackelyne Sayer EMTP

## 2019-05-25 ENCOUNTER — Ambulatory Visit (HOSPITAL_COMMUNITY): Payer: Medicare Other | Attending: Cardiology

## 2019-05-25 ENCOUNTER — Other Ambulatory Visit: Payer: Self-pay

## 2019-05-25 DIAGNOSIS — R079 Chest pain, unspecified: Secondary | ICD-10-CM | POA: Insufficient documentation

## 2019-05-25 LAB — MYOCARDIAL PERFUSION IMAGING
LV dias vol: 86 mL (ref 62–150)
LV sys vol: 32 mL
Peak HR: 96 {beats}/min
Rest HR: 66 {beats}/min
SDS: 2
SRS: 0
SSS: 2
TID: 0.87

## 2019-05-25 MED ORDER — REGADENOSON 0.4 MG/5ML IV SOLN
0.4000 mg | Freq: Once | INTRAVENOUS | Status: AC
  Administered 2019-05-25: 0.4 mg via INTRAVENOUS

## 2019-05-25 MED ORDER — TECHNETIUM TC 99M TETROFOSMIN IV KIT
32.9000 | PACK | Freq: Once | INTRAVENOUS | Status: AC | PRN
  Administered 2019-05-25: 32.9 via INTRAVENOUS
  Filled 2019-05-25: qty 33

## 2019-05-25 MED ORDER — TECHNETIUM TC 99M TETROFOSMIN IV KIT
10.2000 | PACK | Freq: Once | INTRAVENOUS | Status: AC | PRN
  Administered 2019-05-25: 10.2 via INTRAVENOUS
  Filled 2019-05-25: qty 11

## 2019-06-13 ENCOUNTER — Ambulatory Visit (INDEPENDENT_AMBULATORY_CARE_PROVIDER_SITE_OTHER): Payer: Medicare Other | Admitting: *Deleted

## 2019-06-13 DIAGNOSIS — I495 Sick sinus syndrome: Secondary | ICD-10-CM | POA: Diagnosis not present

## 2019-06-14 LAB — CUP PACEART REMOTE DEVICE CHECK
Battery Remaining Longevity: 89 mo
Battery Voltage: 3.02 V
Brady Statistic AP VP Percent: 0.01 %
Brady Statistic AP VS Percent: 0.75 %
Brady Statistic AS VP Percent: 0.04 %
Brady Statistic AS VS Percent: 99.21 %
Brady Statistic RA Percent Paced: 0.76 %
Brady Statistic RV Percent Paced: 0.04 %
Date Time Interrogation Session: 20201202112100
Implantable Lead Implant Date: 20170210
Implantable Lead Implant Date: 20170210
Implantable Lead Location: 753859
Implantable Lead Location: 753860
Implantable Lead Model: 5076
Implantable Lead Model: 5076
Implantable Pulse Generator Implant Date: 20170210
Lead Channel Impedance Value: 304 Ohm
Lead Channel Impedance Value: 380 Ohm
Lead Channel Impedance Value: 399 Ohm
Lead Channel Impedance Value: 494 Ohm
Lead Channel Pacing Threshold Amplitude: 0.625 V
Lead Channel Pacing Threshold Amplitude: 0.625 V
Lead Channel Pacing Threshold Pulse Width: 0.4 ms
Lead Channel Pacing Threshold Pulse Width: 0.4 ms
Lead Channel Sensing Intrinsic Amplitude: 15.75 mV
Lead Channel Sensing Intrinsic Amplitude: 15.75 mV
Lead Channel Sensing Intrinsic Amplitude: 2.625 mV
Lead Channel Sensing Intrinsic Amplitude: 2.625 mV
Lead Channel Setting Pacing Amplitude: 2 V
Lead Channel Setting Pacing Amplitude: 2.5 V
Lead Channel Setting Pacing Pulse Width: 0.4 ms
Lead Channel Setting Sensing Sensitivity: 2.8 mV

## 2019-06-15 ENCOUNTER — Telehealth: Payer: Self-pay

## 2019-06-15 MED ORDER — METOPROLOL SUCCINATE ER 25 MG PO TB24: 25.0000 mg | ORAL_TABLET | Freq: Every day | ORAL | 3 refills | Status: DC

## 2019-06-15 NOTE — Telephone Encounter (Signed)
Call placed to Pt.  Discussed last remote transmission and recommendation to start Toprol XL.  Per Pt he has discovered that his CPAP is not working correctly. He will take to get fixed.  Advised to start Toprol XL 25 mg one tablet by mouth daily to help control fast heart rates.  Advised to take it at night.  Pt indicates understanding.

## 2019-06-15 NOTE — Telephone Encounter (Signed)
-----   Message from Thompson Grayer, MD sent at 06/14/2019 10:18 PM EST ----- Remote device check reviewed.   Device report notable for:  NSVT events.  EF normal,  Recent myoview was low risk.  Appears to still have some chest pain episodes for which Dr Elmarie Shiley team is aware.  Sonia Baller, please start toprol 25mg  daily.  Continue to follow remotely.

## 2019-08-23 ENCOUNTER — Other Ambulatory Visit: Payer: Self-pay

## 2019-08-23 ENCOUNTER — Ambulatory Visit (INDEPENDENT_AMBULATORY_CARE_PROVIDER_SITE_OTHER): Payer: Medicare Other | Admitting: Cardiovascular Disease

## 2019-08-23 ENCOUNTER — Encounter: Payer: Self-pay | Admitting: Cardiovascular Disease

## 2019-08-23 VITALS — BP 104/46 | HR 76 | Ht 72.0 in | Wt 196.0 lb

## 2019-08-23 DIAGNOSIS — R0789 Other chest pain: Secondary | ICD-10-CM | POA: Diagnosis not present

## 2019-08-23 DIAGNOSIS — R55 Syncope and collapse: Secondary | ICD-10-CM | POA: Diagnosis not present

## 2019-08-23 NOTE — Patient Instructions (Signed)
Medication Instructions:  Your physician recommends that you continue on your current medications as directed. Please refer to the Current Medication list given to you today.  *If you need a refill on your cardiac medications before your next appointment, please call your pharmacy*  Lab Work: None If you have labs (blood work) drawn today and your tests are completely normal, you will receive your results only by: . MyChart Message (if you have MyChart) OR . A paper copy in the mail If you have any lab test that is abnormal or we need to change your treatment, we will call you to review the results.  Testing/Procedures: None  Follow-Up: At [facility], you and your health needs are our priority.  As part of our continuing mission to provide you with exceptional heart care, we have created designated Provider Care Teams.  These Care Teams include your primary Cardiologist (physician) and Advanced Practice Providers (APPs -  Physician Assistants and Nurse Practitioners) who all work together to provide you with the care you need, when you need it.  Your next appointment:   12 month(s)  The format for your next appointment:   In Person  Provider:   You may see Philip Nahser, MD or one of the following Advanced Practice Providers on your designated Care Team:    Scott Weaver, PA-C  Vin Bhagat, PA-C  Janine Hammond, NP   Other Instructions   

## 2019-08-23 NOTE — Progress Notes (Signed)
Colton Bennett Date of Birth  [birth year]       [facility]    [place] [address], [address]  [address] [place], [place]  [address]   [place], [place]  [address] [phone]     [phone]   Fax  [phone]    Fax [phone]  Problem List: 1. Syncope  2. Dyspnea 3.  Sick sinus syndrome -  S/p pacer    Colton Bennett is a 86 yo with a recent episode of collapse.  Colton Bennett was seen with his wife who provided most of the accurate history - Colton Bennett seems to play his symptoms down quite a bit.    Colton Bennett got out of his truck and collapsed in the parking lot.  Colton Bennett was talking and remained conscious the whole time.   Colton Bennett had severe nausea and vomitting after that.   Colton Bennett remained unsteady and was not able to walk for several hours.  Colton Bennett did not want to go to the doctor or ER.    Colton Bennett felt better after several days.  Colton Bennett were 30 day event monitor. Colton Bennett was found to have normal sinus rhythm. Colton Bennett had occasional episodes of frequent premature ventricular contractions-occasionally in a bigeminal pattern.  Colton Bennett did not have any symptoms while wearing the monitor.  Colton Bennett had an echocardiogram which revealed normal left ventricle systolic function with an ejection fraction of 50-55%. Colton Bennett had normal wall motion. Colton Bennett had trivial aortic insufficiency, mild mitral regurgitation. The left atrium was mildly to moderately dilated.  His EEG was normal.  Colton Bennett's had 2 different sleep studies. The first study was July 31 which revealed severe sleep apnea during REM sleep. Colton Bennett had a second sleep study the results of which are not known at this time.  His MRI was normal. The head MRA is normal.  September 27, 2013:  Colton Bennett has had an implantable loop recorder placed.   Colton Bennett's had a few episodes of presyncope.  Colton Bennett exercises on occasion. Colton Bennett denies any chest pain or shortness breath.  Colton Bennett was found to have a 3 second pause with his ILR.    Sept. 16,2015:  Colton Bennett has done well.  No further syncopal episodes.    Still has the implantable loop.    Has been busy working in his garden this summer.   Sept. 19 , 2016: Had an episode of dizziness 2 weeks ago .  No CP   Sept. 7, 2017:  Colton Bennett is seen today for follow up visit No further passing out episodes.  No CP or dyspnea  Sept. 24, 2018  Colton Bennett is seen , some DOE (according to wife) but Colton Bennett thinks hes doing fine .  No CP , or dizziness.  Gets pacer checked remotely   Sept. 24, 2019: Colton Bennett is seen back today for follow-up of his sick sinus syndrome.  Colton Bennett had a pacemaker placed several years ago and feels well. Also has a history of obstructive sleep apnea.  No CP or dyspnea  Exercised until his foot started hurting . No CP or dyspnea.   August 23, 2019: Colton Bennett is seen back today for follow-up of his sick sinus syndrome.  Colton Bennett had a pacemaker placed in his followed up with Dr. Melany Guernsey and  the EP team.  Colton Bennett was recently seen by Dr. Delano Metz for bilateral pneumonia in January.  Colton Bennett lost his sense of tast and smell - has returned now.  Never had a fever .  Has  tested negative for COVID  X 2 .  Took him a long time to get over his pneumonia   Colton Bennett was seen by Pecolia Ades, NP in November, 2020 for chest pain. Lexiscan Myoview study at that time revealed no ischemia and normal ejection fraction.  Is not having any other issues.         Current Outpatient Medications on File Prior to Visit  Medication Sig Dispense Refill  . alfuzosin (UROXATRAL) 10 MG 24 hr tablet Take 10 mg by mouth daily with breakfast.     . cyanocobalamin 2000 MCG tablet Take 2,500 mcg by mouth daily.    . metoprolol succinate (TOPROL XL) 25 MG 24 hr tablet Take 1 tablet (25 mg total) by mouth daily. 90 tablet 3  . omeprazole (PRILOSEC) 40 MG capsule Take 40 mg by mouth daily.     No current facility-administered medications on file prior to visit.    No Known Allergies  Past Medical History:  Diagnosis Date  . Chest pain   . Dizziness   . DOE (dyspnea on exertion)   .  GERD (gastroesophageal reflux disease)   . History of cardiovascular stress test    ETT-Myoview (06/07/13): No ischemia, EF 56%; normal study.  . History of echocardiogram    Echocardiogram (01/2013): EF of 50-55%, normal wall motion, trivial AI, mild MR, mild to moderate LAE  . OSA on CPAP   . Presence of permanent cardiac pacemaker   . SSS (sick sinus syndrome) ([place])   . Syncope, near     Past Surgical History:  Procedure Laterality Date  . EP IMPLANTABLE DEVICE N/A 08/23/2015   MDT Advisa DR MRI compatible pacemaker implanted by Dr Rayann Heman for sick sinus and syncope  . EP IMPLANTABLE DEVICE N/A 08/23/2015   Procedure: Loop Recorder Removal;  Surgeon: Thompson Grayer, MD;  Location: [facility];  Service: Cardiovascular;  Laterality: N/A;  . LOOP RECORDER IMPLANT  September 2014  . LOOP RECORDER IMPLANT N/A 04/05/2013   Procedure: LOOP RECORDER IMPLANT;  Surgeon: Coralyn Mark, MD;  Location: [facility];  Service: Cardiovascular;  Laterality: N/A;    Social History   Tobacco Use  Smoking Status Former Smoker  . Packs/day: 3.00  . Years: 30.00  . Pack years: 90.00  . Types: Cigarettes  . Quit date: 02/01/1982  . Years since quitting: 37.5  Smokeless Tobacco Never Used    Social History   Substance and Sexual Activity  Alcohol Use No  . Alcohol/week: 0.0 standard drinks    Family History  Problem Relation Age of Onset  . Other Father        Died at age 83  of unknown cause  . Lung cancer Brother   . Dementia Brother   . Suicidality Brother        died of suicide  . Lymphoma Brother   . CAD Neg Hx     Reviw of Systems:  Reviewed in the HPI.  All other systems are negative.  Physical Exam: Blood pressure (!) 104/46, pulse 76, height 6' (1.829 m), weight 196 lb (88.9 kg), SpO2 95 %.  GEN: Elderly gentleman, no acute distress HEENT: Normal NECK: No JVD; No carotid bruits LYMPHATICS: No lymphadenopathy CARDIAC: RRR , no murmurs, rubs, gallops RESPIRATORY:   Clear to auscultation without rales, wheezing or rhonchi  ABDOMEN: Soft, non-tender, non-distended MUSCULOSKELETAL:  No edema; No deformity  SKIN: Warm and dry NEUROLOGIC:  Alert and oriented x 3   ECG:  Assessment / Plan:   1.  Sick sinus syndrome - s/p pacer    Doing well.  Is not having any episodes of tachycardia or bradycardia.  Continue Toprol-XL.  Colton Bennett will continue to follow-up with Dr. Graciela Husbands for his pacemaker.  2.  Pneumonia: The patient had an episode of pneumonia.  Colton Bennett lost his sense of taste and smell.  Colton Bennett tested negative for Covid x2.  I suspect that Colton Bennett actually did have Covid given his loss of smell and tase and the severity of his pneumonia.  Colton Bennett is feeling quite a bit better.  Colton Bennett is back doing his normal activities.  I will see him again in 1 year.  I will plan on having him see Lizabeth Leyden, NP the following year.    Kristeen Miss, MD  08/23/2019 11:00 AM    [facility] [address] [place],  [address] [place], [place]  [address] Pager [phone] Phone: [phone]; Fax: [phone]

## 2019-09-12 ENCOUNTER — Ambulatory Visit (INDEPENDENT_AMBULATORY_CARE_PROVIDER_SITE_OTHER): Payer: Medicare Other | Admitting: *Deleted

## 2019-09-12 DIAGNOSIS — I495 Sick sinus syndrome: Secondary | ICD-10-CM

## 2019-09-12 LAB — CUP PACEART REMOTE DEVICE CHECK
Battery Remaining Longevity: 86 mo
Battery Voltage: 3.01 V
Brady Statistic AP VP Percent: 0.01 %
Brady Statistic AP VS Percent: 1.7 %
Brady Statistic AS VP Percent: 0.04 %
Brady Statistic AS VS Percent: 98.25 %
Brady Statistic RA Percent Paced: 1.7 %
Brady Statistic RV Percent Paced: 0.05 %
Date Time Interrogation Session: 20210302085413
Implantable Lead Implant Date: 20170210
Implantable Lead Implant Date: 20170210
Implantable Lead Location: 753859
Implantable Lead Location: 753860
Implantable Lead Model: 5076
Implantable Lead Model: 5076
Implantable Pulse Generator Implant Date: 20170210
Lead Channel Impedance Value: 304 Ohm
Lead Channel Impedance Value: 380 Ohm
Lead Channel Impedance Value: 399 Ohm
Lead Channel Impedance Value: 475 Ohm
Lead Channel Pacing Threshold Amplitude: 0.5 V
Lead Channel Pacing Threshold Amplitude: 0.625 V
Lead Channel Pacing Threshold Pulse Width: 0.4 ms
Lead Channel Pacing Threshold Pulse Width: 0.4 ms
Lead Channel Sensing Intrinsic Amplitude: 1.375 mV
Lead Channel Sensing Intrinsic Amplitude: 1.375 mV
Lead Channel Sensing Intrinsic Amplitude: 15.375 mV
Lead Channel Sensing Intrinsic Amplitude: 15.375 mV
Lead Channel Setting Pacing Amplitude: 2 V
Lead Channel Setting Pacing Amplitude: 2.5 V
Lead Channel Setting Pacing Pulse Width: 0.4 ms
Lead Channel Setting Sensing Sensitivity: 2.8 mV

## 2019-09-13 NOTE — Progress Notes (Signed)
PPM Remote  

## 2019-10-19 ENCOUNTER — Telehealth: Payer: Self-pay | Admitting: Cardiovascular Disease

## 2019-10-19 MED ORDER — NITROGLYCERIN 0.4 MG SL SUBL: 0.4000 mg | SUBLINGUAL_TABLET | SUBLINGUAL | 6 refills | Status: AC | PRN

## 2019-10-19 MED ORDER — ASPIRIN EC 81 MG PO TBEC: 81.0000 mg | DELAYED_RELEASE_TABLET | Freq: Every day | ORAL | Status: AC

## 2019-10-19 NOTE — Telephone Encounter (Signed)
New message     Patient called wanting an appt with Dr Elease Hashimoto for "light" chest pain and SOB for about 2wks.  Appt made for tomorrow at 1:40.  Not sure if he should be triaged so message sent as a precaution.

## 2019-10-19 NOTE — Telephone Encounter (Signed)
Called patient to discuss his symptoms. He states he started having chest pain over the last 2-3 months that he describes as "light" and "dull."  He states he has noticed the pain starting to occur with activity recently and resolves with rest. States he is not having any chest pain currently but he will occasionally have left arm pain associated with the chest pain. States he has been SOB since he had Covid-19 illness.  He is scheduled to see Dr. Elease Hashimoto tomorrow. I reviewed patient's symptoms with Dr. Elease Hashimoto who is in the office and he advised that patient start aspirin 81 mg today and pick up SL NTG from pharmacy and keep appointment for tomorrow. I advised patient how to use NTG and advised him of ER precautions. He is aware that we will likely plan for an outpatient cardiac cath next wee. I offered opportunity for his wife to accompany him to the appointment but he declines. Patient agrees with plan of care and thanked me for the help.

## 2019-10-20 ENCOUNTER — Other Ambulatory Visit: Payer: Self-pay

## 2019-10-20 ENCOUNTER — Encounter: Payer: Self-pay | Admitting: Cardiovascular Disease

## 2019-10-20 ENCOUNTER — Ambulatory Visit (INDEPENDENT_AMBULATORY_CARE_PROVIDER_SITE_OTHER): Payer: Medicare Other | Admitting: Cardiovascular Disease

## 2019-10-20 VITALS — BP 124/62 | HR 62 | Ht 72.0 in | Wt 194.5 lb

## 2019-10-20 DIAGNOSIS — I200 Unstable angina: Secondary | ICD-10-CM | POA: Diagnosis not present

## 2019-10-20 DIAGNOSIS — I25110 Atherosclerotic heart disease of native coronary artery with unstable angina pectoris: Secondary | ICD-10-CM | POA: Diagnosis not present

## 2019-10-20 NOTE — H&P (View-Only) (Signed)
Colton Bennett Date of Birth  [birth year]       [facility]    [place] [address]  [address] [place], [place]  [address]   [place], [place]  [address] [phone]     [phone]   Fax  [phone]    Fax [phone]  Problem List: 1. Syncope  2. Dyspnea 3.  Sick sinus syndrome -  S/p pacer    Colton Bennett is a 86 yo with a recent episode of collapse.  Colton Bennett was seen with his wife who provided most of the accurate history - Colton Bennett seems to play his symptoms down quite a bit.    Colton Bennett got out of his truck and collapsed in the parking lot.  Colton Bennett was talking and remained conscious the whole time.   Colton Bennett had severe nausea and vomitting after that.   Colton Bennett remained unsteady and was not able to walk for several hours.  Colton Bennett did not want to go to the doctor or ER.    Colton Bennett felt better after several days.  Colton Bennett were 30 day event monitor. Colton Bennett was found to have normal sinus rhythm. Colton Bennett had occasional episodes of frequent premature ventricular contractions-occasionally in a bigeminal pattern.  Colton Bennett did not have any symptoms while wearing the monitor.  Colton Bennett had an echocardiogram which revealed normal left ventricle systolic function with an ejection fraction of 50-55%. Colton Bennett had normal wall motion. Colton Bennett had trivial aortic insufficiency, mild mitral regurgitation. The left atrium was mildly to moderately dilated.  His EEG was normal.  Colton Bennett's had 2 different sleep studies. The first study was July 31 which revealed severe sleep apnea during REM sleep. Colton Bennett had a second sleep study the results of which are not known at this time.  His MRI was normal. The head MRA is normal.  September 27, 2013:  Colton Bennett has had an implantable loop recorder placed.   Colton Bennett's had a few episodes of presyncope.  Colton Bennett exercises on occasion. Colton Bennett denies any chest pain or shortness breath.  Colton Bennett was found to have a 3 second pause with his ILR.    Sept. 16,2015:  Colton Bennett has done well.  No further syncopal episodes.    Still has the implantable loop.    Has been busy working in his garden this summer.   Sept. 19 , 2016: Had an episode of dizziness 2 weeks ago .  No CP   Sept. 7, 2017:  Colton Bennett is seen today for follow up visit No further passing out episodes.  No CP or dyspnea  Sept. 24, 2018  Colton Bennett is seen , some DOE (according to wife) but Colton Bennett thinks hes doing fine .  No CP , or dizziness.  Gets pacer checked remotely   Sept. 24, 2019: Colton Bennett is seen back today for follow-up of his sick sinus syndrome.  Colton Bennett had a pacemaker placed several years ago and feels well. Also has a history of obstructive sleep apnea.  No CP or dyspnea  Exercised until his foot started hurting . No CP or dyspnea.   August 23, 2019: Colton Bennett is seen back today for follow-up of his sick sinus syndrome.  Colton Bennett had a pacemaker placed in his followed up with Dr. Melany Guernsey and  the EP team.  Colton Bennett was recently seen by Dr. Delano Metz for bilateral pneumonia in January.  Colton Bennett lost his sense of tast and smell - has returned now.  Never had a fever .  Has  tested negative for COVID  X 2 .  Took him a long time to get over his pneumonia   Colton Bennett was seen by Lizabeth Leyden, NP in November, 2020 for chest pain. Lexiscan Myoview study at that time revealed no ischemia and normal ejection fraction.  Is not having any other issues.     September 19, 2019:  Colton Bennett is  seen today. Has been having some episodes of chest pain.  Pain is a steady pain .  Lasts for 1-2 min.   Spontaneous.  Last episode was when Colton Bennett was using his tiller  Pressure like , tightness like sensation.  Occasionally goes down his arm.  Occurs with rest and exertion.    Has been going on for several months, worse over the past 2 weeks.    Pain is associated with dyspnea    Current Outpatient Medications on File Prior to Visit  Medication Sig Dispense Refill  . alfuzosin (UROXATRAL) 10 MG 24 hr tablet Take 10 mg by mouth daily with breakfast.     . aspirin EC 81 MG tablet Take 1 tablet (81  mg total) by mouth daily.    . cyanocobalamin 2000 MCG tablet Take 2,500 mcg by mouth daily.    . metoprolol succinate (TOPROL XL) 25 MG 24 hr tablet Take 1 tablet (25 mg total) by mouth daily. 90 tablet 3  . nitroGLYCERIN (NITROSTAT) 0.4 MG SL tablet Place 1 tablet (0.4 mg total) under the tongue every 5 (five) minutes as needed for chest pain. 25 tablet 6  . omeprazole (PRILOSEC) 40 MG capsule Take 40 mg by mouth daily.     No current facility-administered medications on file prior to visit.    No Known Allergies  Past Medical History:  Diagnosis Date  . Chest pain   . Dizziness   . DOE (dyspnea on exertion)   . GERD (gastroesophageal reflux disease)   . History of cardiovascular stress test    ETT-Myoview (06/07/13): No ischemia, EF 56%; normal study.  . History of echocardiogram    Echocardiogram (01/2013): EF of 50-55%, normal wall motion, trivial AI, mild MR, mild to moderate LAE  . OSA on CPAP   . Presence of permanent cardiac pacemaker   . SSS (sick sinus syndrome) ([facility])   . Syncope, near     Past Surgical History:  Procedure Laterality Date  . EP IMPLANTABLE DEVICE N/A 08/23/2015   MDT Advisa DR MRI compatible pacemaker implanted by Dr Johney Frame for sick sinus and syncope  . EP IMPLANTABLE DEVICE N/A 08/23/2015   Procedure: Loop Recorder Removal;  Surgeon: Hillis Range, MD;  Location: [facility];  Service: Cardiovascular;  Laterality: N/A;  . LOOP RECORDER IMPLANT  September 2014  . LOOP RECORDER IMPLANT N/A 04/05/2013   Procedure: LOOP RECORDER IMPLANT;  Surgeon: Gardiner Rhyme, MD;  Location: [facility];  Service: Cardiovascular;  Laterality: N/A;    Social History   Tobacco Use  Smoking Status Former Smoker  . Packs/day: 3.00  . Years: 30.00  . Pack years: 90.00  . Types: Cigarettes  . Quit date: 02/01/1982  . Years since quitting: 37.7  Smokeless Tobacco Never Used    Social History   Substance and Sexual Activity  Alcohol Use No  . Alcohol/week:  0.0 standard drinks    Family History  Problem Relation Age of Onset  . Other Father        Died at age 16  of unknown cause  . Lung cancer  Brother   . Dementia Brother   . Suicidality Brother        died of suicide  . Lymphoma Brother   . CAD Neg Hx     Reviw of Systems:  Reviewed in the HPI.  All other systems are negative.   Physical Exam: Blood pressure 124/62, pulse 62, height 6' (1.829 m), weight 194 lb 8 oz (88.2 kg), SpO2 96 %.  GEN: Elderly gentleman, no acute distress HEENT: Normal NECK: No JVD; No carotid bruits LYMPHATICS: No lymphadenopathy CARDIAC: RRR , no murmurs, rubs, gallops RESPIRATORY:  Clear to auscultation without rales, wheezing or rhonchi  ABDOMEN: Soft, non-tender, non-distended MUSCULOSKELETAL:  No edema; No deformity  SKIN: Warm and dry NEUROLOGIC:  Alert and oriented x 3   ECG: October 20, 2019: Normal sinus rhythm with rare premature ventricular contractions.  Demand atrial pacing.    Assessment / Plan:   1.  Sick sinus syndrome - s/p pacer     2.  Unstable angina: Colton Bennett presents with symptoms that are consistent with unstable angina.  For the past 2 months Colton Bennett has been having episodes of chest discomfort.  These episodes have worsened over the past 2 weeks.  These are described as a chest tightness with some radiation to the left arm and into his neck.  Is associated with some shortness of breath.  His heart rate and blood pressure are fairly low so I do not think that Colton Bennett will tolerate Imdur .   Cont ASA   We will schedule him for heart catheterization next week.  We discussed the risk, benefits, options of heart cath.  Colton Bennett understands and agrees to proceed.  Scheduled for cath on Wed with Dr. Ellyn Hack .    Mertie Moores, MD  10/20/2019 2:01 PM    [facility] [place],  [place], [place]  [address] Pager [phone] Phone: [phone]; Fax: [phone]

## 2019-10-20 NOTE — Patient Instructions (Addendum)
Medication Instructions:  Your physician recommends that you continue on your current medications as directed. Please refer to the Current Medication list given to you today.  *If you need a refill on your cardiac medications before your next appointment, please call your pharmacy*   Lab Work: TODAY - CBC, BMET If you have labs (blood work) drawn today and your tests are completely normal, you will receive your results only by: Marland Kitchen MyChart Message (if you have MyChart) OR . A paper copy in the mail If you have any lab test that is abnormal or we need to change your treatment, we will call you to review the results.   Follow-Up: At [facility], you and your health needs are our priority.  As part of our continuing mission to provide you with exceptional heart care, we have created designated Provider Care Teams.  These Care Teams include your primary Cardiologist (physician) and Advanced Practice Providers (APPs -  Physician Assistants and Nurse Practitioners) who all work together to provide you with the care you need, when you need it.  We recommend signing up for the patient portal called "MyChart".  Sign up information is provided on this After Visit Summary.  MyChart is used to connect with patients for Virtual Visits (Telemedicine).  Patients are able to view lab/test results, encounter notes, upcoming appointments, etc.  Non-urgent messages can be sent to your provider as well.   To learn more about what you can do with MyChart, go to [email or web address].    Your next appointment:   2 month(s) on June 10 at 9:40 am  The format for your next appointment:   In Person  Provider:   You may see Mertie Moores, MD or one of the following Advanced Practice Providers on your designated Care Team:    Richardson Dopp, PA-C  [place], [place]  Daune Perch, NP    Testing/Procedures:   You are scheduled for a Cardiac Catheterization on Wednesday, April 14 with Dr. Glenetta Hew.  1. Please arrive at the [facility] (Main Entrance A) at [facility]: [address] [place], [place] [address] at 9:30 AM (This time is two hours before your procedure to ensure your preparation). Free valet parking service is available.   Special note: Every effort is made to have your procedure done on time. Please understand that emergencies sometimes delay scheduled procedures.  2. Diet: Do not eat solid foods after midnight.  The patient may have clear liquids until 5am upon the day of the procedure.  3. Labs: Done Today Your Pre-procedure COVID-19 Testing will be done on Monday  April 12 at 1:45 pm at [facility] at [address], [place], [place] [address]. Once you arrive at the testing site, stay in the right hand lane, go under the building overhang not the tent. If you are tested under the tent your results may not be back before your procedure. Please be on time for your appointment.  After your swab you will be given a mask to wear and instructed to go home and quarantine/no visitors until after your procedure. If you test positive you will be notified and your procedure will be cancelled.    4. Medication instructions in preparation for your procedure:   Contrast Allergy: No  On the morning of your procedure, take your Aspirin and any morning medicines NOT listed above.  You may use sips of water.  5. Plan for one night stay--bring personal belongings.  6. Bring a current list of your medications and current insurance cards. 7. You MUST have a responsible person to drive you home. 8. Someone MUST be with you the first 24 hours after you arrive home or your discharge will be delayed. 9. Please wear clothes that are easy to get on and off and wear slip-on shoes.  Thank you for allowing [place] to care for you!   -- [facility]

## 2019-10-20 NOTE — Progress Notes (Signed)
Colton Bennett Date of Birth  [birth year]       [facility]    [place] [address]  [address] [place], [place]  [address]   [place], [place]  [address] [phone]     [phone]   Fax  [phone]    Fax [phone]  Problem List: 1. Syncope  2. Dyspnea 3.  Sick sinus syndrome -  S/p pacer    Colton Bennett is a 86 yo with a recent episode of collapse.  Colton Bennett was seen with his wife who provided most of the accurate history - Colton Bennett seems to play his symptoms down quite a bit.    Colton Bennett got out of his truck and collapsed in the parking lot.  Colton Bennett was talking and remained conscious the whole time.   Colton Bennett had severe nausea and vomitting after that.   Colton Bennett remained unsteady and was not able to walk for several hours.  Colton Bennett did not want to go to the doctor or ER.    Colton Bennett felt better after several days.  Colton Bennett were 30 day event monitor. Colton Bennett was found to have normal sinus rhythm. Colton Bennett had occasional episodes of frequent premature ventricular contractions-occasionally in a bigeminal pattern.  Colton Bennett did not have any symptoms while wearing the monitor.  Colton Bennett had an echocardiogram which revealed normal left ventricle systolic function with an ejection fraction of 50-55%. Colton Bennett had normal wall motion. Colton Bennett had trivial aortic insufficiency, mild mitral regurgitation. The left atrium was mildly to moderately dilated.  His EEG was normal.  Colton Bennett's had 2 different sleep studies. The first study was July 31 which revealed severe sleep apnea during REM sleep. Colton Bennett had a second sleep study the results of which are not known at this time.  His MRI was normal. The head MRA is normal.  September 27, 2013:  Colton Bennett has had an implantable loop recorder placed.   Colton Bennett's had a few episodes of presyncope.  Colton Bennett exercises on occasion. Colton Bennett denies any chest pain or shortness breath.  Colton Bennett was found to have a 3 second pause with his ILR.    Sept. 16,2015:  Colton Bennett has done well.  No further syncopal episodes.    Still has the implantable loop.    Has been busy working in his garden this summer.   Sept. 19 , 2016: Had an episode of dizziness 2 weeks ago .  No CP   Sept. 7, 2017:  Colton Bennett is seen today for follow up visit No further passing out episodes.  No CP or dyspnea  Sept. 24, 2018  Colton Bennett is seen , some DOE (according to wife) but Colton Bennett thinks hes doing fine .  No CP , or dizziness.  Gets pacer checked remotely   Sept. 24, 2019: Colton Bennett is seen back today for follow-up of his sick sinus syndrome.  Colton Bennett had a pacemaker placed several years ago and feels well. Also has a history of obstructive sleep apnea.  No CP or dyspnea  Exercised until his foot started hurting . No CP or dyspnea.   August 23, 2019: Colton Bennett is seen back today for follow-up of his sick sinus syndrome.  Colton Bennett had a pacemaker placed in his followed up with Dr. Melany Guernsey and  the EP team.  Colton Bennett was recently seen by Dr. Delano Metz for bilateral pneumonia in January.  Colton Bennett lost his sense of tast and smell - has returned now.  Never had a fever .  Has  tested negative for COVID  X 2 .  Took him a long time to get over his pneumonia   Colton Bennett was seen by Lizabeth Leyden, NP in November, 2020 for chest pain. Lexiscan Myoview study at that time revealed no ischemia and normal ejection fraction.  Is not having any other issues.     September 19, 2019:  Colton Bennett is  seen today. Has been having some episodes of chest pain.  Pain is a steady pain .  Lasts for 1-2 min.   Spontaneous.  Last episode was when Colton Bennett was using his tiller  Pressure like , tightness like sensation.  Occasionally goes down his arm.  Occurs with rest and exertion.    Has been going on for several months, worse over the past 2 weeks.    Pain is associated with dyspnea    Current Outpatient Medications on File Prior to Visit  Medication Sig Dispense Refill  . alfuzosin (UROXATRAL) 10 MG 24 hr tablet Take 10 mg by mouth daily with breakfast.     . aspirin EC 81 MG tablet Take 1 tablet (81  mg total) by mouth daily.    . cyanocobalamin 2000 MCG tablet Take 2,500 mcg by mouth daily.    . metoprolol succinate (TOPROL XL) 25 MG 24 hr tablet Take 1 tablet (25 mg total) by mouth daily. 90 tablet 3  . nitroGLYCERIN (NITROSTAT) 0.4 MG SL tablet Place 1 tablet (0.4 mg total) under the tongue every 5 (five) minutes as needed for chest pain. 25 tablet 6  . omeprazole (PRILOSEC) 40 MG capsule Take 40 mg by mouth daily.     No current facility-administered medications on file prior to visit.    No Known Allergies  Past Medical History:  Diagnosis Date  . Chest pain   . Dizziness   . DOE (dyspnea on exertion)   . GERD (gastroesophageal reflux disease)   . History of cardiovascular stress test    ETT-Myoview (06/07/13): No ischemia, EF 56%; normal study.  . History of echocardiogram    Echocardiogram (01/2013): EF of 50-55%, normal wall motion, trivial AI, mild MR, mild to moderate LAE  . OSA on CPAP   . Presence of permanent cardiac pacemaker   . SSS (sick sinus syndrome) (HCC)   . Syncope, near     Past Surgical History:  Procedure Laterality Date  . EP IMPLANTABLE DEVICE N/A 08/23/2015   MDT Advisa DR MRI compatible pacemaker implanted by Dr Johney Frame for sick sinus and syncope  . EP IMPLANTABLE DEVICE N/A 08/23/2015   Procedure: Loop Recorder Removal;  Surgeon: Hillis Range, MD;  Location: [facility] INVASIVE CV LAB;  Service: Cardiovascular;  Laterality: N/A;  . LOOP RECORDER IMPLANT  September 2014  . LOOP RECORDER IMPLANT N/A 04/05/2013   Procedure: LOOP RECORDER IMPLANT;  Surgeon: Gardiner Rhyme, MD;  Location: [facility] CATH LAB;  Service: Cardiovascular;  Laterality: N/A;    Social History   Tobacco Use  Smoking Status Former Smoker  . Packs/day: 3.00  . Years: 30.00  . Pack years: 90.00  . Types: Cigarettes  . Quit date: 02/01/1982  . Years since quitting: 37.7  Smokeless Tobacco Never Used    Social History   Substance and Sexual Activity  Alcohol Use No  . Alcohol/week:  0.0 standard drinks    Family History  Problem Relation Age of Onset  . Other Father        Died at age 16  of unknown cause  . Lung cancer  Brother   . Dementia Brother   . Suicidality Brother        died of suicide  . Lymphoma Brother   . CAD Neg Hx     Reviw of Systems:  Reviewed in the HPI.  All other systems are negative.   Physical Exam: Blood pressure 124/62, pulse 62, height 6' (1.829 m), weight 194 lb 8 oz (88.2 kg), SpO2 96 %.  GEN: Elderly gentleman, no acute distress HEENT: Normal NECK: No JVD; No carotid bruits LYMPHATICS: No lymphadenopathy CARDIAC: RRR , no murmurs, rubs, gallops RESPIRATORY:  Clear to auscultation without rales, wheezing or rhonchi  ABDOMEN: Soft, non-tender, non-distended MUSCULOSKELETAL:  No edema; No deformity  SKIN: Warm and dry NEUROLOGIC:  Alert and oriented x 3   ECG: October 20, 2019: Normal sinus rhythm with rare premature ventricular contractions.  Demand atrial pacing.    Assessment / Plan:   1.  Sick sinus syndrome - s/p pacer     2.  Unstable angina: Colton Bennett presents with symptoms that are consistent with unstable angina.  For the past 2 months Colton Bennett has been having episodes of chest discomfort.  These episodes have worsened over the past 2 weeks.  These are described as a chest tightness with some radiation to the left arm and into his neck.  Is associated with some shortness of breath.  His heart rate and blood pressure are fairly low so I do not think that Colton Bennett will tolerate Imdur .   Cont ASA   We will schedule him for heart catheterization next week.  We discussed the risk, benefits, options of heart cath.  Colton Bennett understands and agrees to proceed.  Scheduled for cath on Wed with Dr. Harding .    Colton Costabile, MD  10/20/2019 2:01 PM     [facility] [address],  [address] [place], [place]  [address] Pager [phone] Phone: [phone]; Fax: [phone]      

## 2019-10-21 LAB — CBC
Hematocrit: 38.4 % (ref 37.5–51.0)
Hemoglobin: 12.9 g/dL — ABNORMAL LOW (ref 13.0–17.7)
MCH: 30.9 pg (ref 26.6–33.0)
MCHC: 33.6 g/dL (ref 31.5–35.7)
MCV: 92 fL (ref 79–97)
Platelets: 202 10*3/uL (ref 150–450)
RBC: 4.17 x10E6/uL (ref 4.14–5.80)
RDW: 12.7 % (ref 11.6–15.4)
WBC: 7.6 10*3/uL (ref 3.4–10.8)

## 2019-10-21 LAB — BASIC METABOLIC PANEL
BUN/Creatinine Ratio: 14 (ref 10–24)
BUN: 14 mg/dL (ref 8–27)
CO2: 23 mmol/L (ref 20–29)
Calcium: 9.1 mg/dL (ref 8.6–10.2)
Chloride: 105 mmol/L (ref 96–106)
Creatinine, Ser: 0.97 mg/dL (ref 0.76–1.27)
GFR calc Af Amer: 83 mL/min/{1.73_m2} (ref >59)
GFR calc non Af Amer: 72 mL/min/{1.73_m2} (ref >59)
Glucose: 83 mg/dL (ref 65–99)
Potassium: 4.3 mmol/L (ref 3.5–5.2)
Sodium: 141 mmol/L (ref 134–144)

## 2019-10-23 ENCOUNTER — Other Ambulatory Visit (HOSPITAL_COMMUNITY)
Admission: RE | Admit: 2019-10-23 | Discharge: 2019-10-23 | Disposition: A | Discharge status: Home / Self Care | Payer: Medicare Other | Source: Ambulatory Visit | Attending: Cardiology | Admitting: Cardiology

## 2019-10-23 DIAGNOSIS — Z20822 Contact with and (suspected) exposure to covid-19: Secondary | ICD-10-CM | POA: Insufficient documentation

## 2019-10-23 DIAGNOSIS — Z01812 Encounter for preprocedural laboratory examination: Secondary | ICD-10-CM | POA: Insufficient documentation

## 2019-10-23 LAB — SARS CORONAVIRUS 2 (TAT 6-24 HRS): SARS Coronavirus 2: NEGATIVE

## 2019-10-24 ENCOUNTER — Telehealth: Payer: Self-pay | Admitting: *Deleted

## 2019-10-24 NOTE — Telephone Encounter (Signed)
Pt contacted pre-catheterization scheduled at [facility] for: Wednesday October 25, 2019 11:30 AM Verified arrival time and place: [facility] Main Entrance [facility]) at: 9:30 AM   No solid food after midnight prior to cath, clear liquids until 5 AM day of procedure.  AM meds can be  taken pre-cath with sip of water including: ASA 81 mg   Confirmed patient has responsible adult to drive home post procedure and observe 24 hours after arriving home: yes  Currently, due to Covid-19 pandemic, only one person will be allowed with patient. Must be the same person for patient's entire stay and will be required to wear a mask. They will be asked to wait in the waiting room for the duration of the patient's stay.  Patients are required to wear a mask when they enter the hospital.      COVID-19 Pre-Screening Questions:  . In the past 7 to 10 days have you had new cough,  shortness of breath, headache, congestion, fever (100 or greater) body aches, chills, sore throat, or sudden loss of taste or sense of smell? No . Have you been around anyone with known Covid 19 in the past 7 to 10 days? no . Have you been around anyone who is awaiting Covid 19 test results in the past 7 to 10 days? no . Have you been around anyone who has mentioned symptoms of Covid 19 within the past 7 to 10 days? no    Reviewed procedure/mask/visitor instructions, COVID-19 screening questions with patient.

## 2019-10-25 ENCOUNTER — Encounter (HOSPITAL_COMMUNITY): Payer: Self-pay | Admitting: Cardiology

## 2019-10-25 ENCOUNTER — Other Ambulatory Visit: Payer: Self-pay

## 2019-10-25 ENCOUNTER — Ambulatory Visit (HOSPITAL_COMMUNITY)
Admission: RE | Admit: 2019-10-25 | Discharge: 2019-10-25 | Disposition: A | Discharge status: Home / Self Care | Payer: Medicare Other | Attending: Cardiology | Admitting: Cardiology

## 2019-10-25 ENCOUNTER — Encounter (HOSPITAL_COMMUNITY)
Admission: RE | Disposition: A | Discharge status: Home / Self Care | Payer: Self-pay | Source: Home / Self Care | Attending: Cardiology

## 2019-10-25 DIAGNOSIS — R55 Syncope and collapse: Secondary | ICD-10-CM | POA: Diagnosis not present

## 2019-10-25 DIAGNOSIS — G4733 Obstructive sleep apnea (adult) (pediatric): Secondary | ICD-10-CM | POA: Diagnosis not present

## 2019-10-25 DIAGNOSIS — Z950 Presence of cardiac pacemaker: Secondary | ICD-10-CM | POA: Diagnosis not present

## 2019-10-25 DIAGNOSIS — Z87891 Personal history of nicotine dependence: Secondary | ICD-10-CM | POA: Diagnosis not present

## 2019-10-25 DIAGNOSIS — Z79899 Other long term (current) drug therapy: Secondary | ICD-10-CM | POA: Insufficient documentation

## 2019-10-25 DIAGNOSIS — K219 Gastro-esophageal reflux disease without esophagitis: Secondary | ICD-10-CM | POA: Insufficient documentation

## 2019-10-25 DIAGNOSIS — I25110 Atherosclerotic heart disease of native coronary artery with unstable angina pectoris: Secondary | ICD-10-CM

## 2019-10-25 DIAGNOSIS — R0600 Dyspnea, unspecified: Secondary | ICD-10-CM | POA: Diagnosis not present

## 2019-10-25 DIAGNOSIS — I200 Unstable angina: Secondary | ICD-10-CM | POA: Diagnosis present

## 2019-10-25 DIAGNOSIS — Z7982 Long term (current) use of aspirin: Secondary | ICD-10-CM | POA: Insufficient documentation

## 2019-10-25 DIAGNOSIS — I495 Sick sinus syndrome: Secondary | ICD-10-CM | POA: Diagnosis not present

## 2019-10-25 HISTORY — PX: LEFT HEART CATH AND CORONARY ANGIOGRAPHY: CATH118249

## 2019-10-25 SURGERY — LEFT HEART CATH AND CORONARY ANGIOGRAPHY
Anesthesia: LOCAL

## 2019-10-25 MED ORDER — SODIUM CHLORIDE 0.9 % IV SOLN: 250.0000 mL | INTRAVENOUS | Status: DC | PRN

## 2019-10-25 MED ORDER — SODIUM CHLORIDE 0.9 % IV SOLN: INTRAVENOUS | Status: DC

## 2019-10-25 MED ORDER — VERAPAMIL HCL 2.5 MG/ML IV SOLN
INTRAVENOUS | Status: DC | PRN
  Administered 2019-10-25: 12:00:00 10 mL via INTRA_ARTERIAL

## 2019-10-25 MED ORDER — FENTANYL CITRATE (PF) 100 MCG/2ML IJ SOLN
INTRAMUSCULAR | Status: AC
  Filled 2019-10-25: qty 2

## 2019-10-25 MED ORDER — SODIUM CHLORIDE 0.9% FLUSH: 3.0000 mL | Freq: Two times a day (BID) | INTRAVENOUS | Status: DC

## 2019-10-25 MED ORDER — SODIUM CHLORIDE 0.9% FLUSH: 3.0000 mL | INTRAVENOUS | Status: DC | PRN

## 2019-10-25 MED ORDER — LIDOCAINE HCL (PF) 1 % IJ SOLN
INTRAMUSCULAR | Status: DC | PRN
  Administered 2019-10-25: 2 mL via INTRADERMAL

## 2019-10-25 MED ORDER — HEPARIN (PORCINE) IN NACL 1000-0.9 UT/500ML-% IV SOLN
INTRAVENOUS | Status: AC
  Filled 2019-10-25: qty 1000

## 2019-10-25 MED ORDER — SODIUM CHLORIDE 0.9 % WEIGHT BASED INFUSION: 1.0000 mL/kg/h | INTRAVENOUS | Status: DC

## 2019-10-25 MED ORDER — MIDAZOLAM HCL 2 MG/2ML IJ SOLN
INTRAMUSCULAR | Status: DC | PRN
  Administered 2019-10-25: 1 mg via INTRAVENOUS

## 2019-10-25 MED ORDER — MIDAZOLAM HCL 2 MG/2ML IJ SOLN
INTRAMUSCULAR | Status: AC
  Filled 2019-10-25: qty 2

## 2019-10-25 MED ORDER — HEPARIN SODIUM (PORCINE) 1000 UNIT/ML IJ SOLN
INTRAMUSCULAR | Status: AC
  Filled 2019-10-25: qty 1

## 2019-10-25 MED ORDER — HEPARIN SODIUM (PORCINE) 1000 UNIT/ML IJ SOLN
INTRAMUSCULAR | Status: DC | PRN
  Administered 2019-10-25: 5000 [IU] via INTRAVENOUS

## 2019-10-25 MED ORDER — LABETALOL HCL 5 MG/ML IV SOLN: 10.0000 mg | INTRAVENOUS | Status: DC | PRN

## 2019-10-25 MED ORDER — IOHEXOL 350 MG/ML SOLN
INTRAVENOUS | Status: DC | PRN
  Administered 2019-10-25: 13:00:00 75 mL via INTRA_ARTERIAL

## 2019-10-25 MED ORDER — VERAPAMIL HCL 2.5 MG/ML IV SOLN
INTRAVENOUS | Status: AC
  Filled 2019-10-25: qty 2

## 2019-10-25 MED ORDER — ISOSORBIDE MONONITRATE ER 30 MG PO TB24: 30.0000 mg | ORAL_TABLET | Freq: Every day | ORAL | 11 refills | Status: DC

## 2019-10-25 MED ORDER — ACETAMINOPHEN 325 MG PO TABS: 650.0000 mg | ORAL_TABLET | ORAL | Status: DC | PRN

## 2019-10-25 MED ORDER — ASPIRIN 81 MG PO CHEW: 81.0000 mg | CHEWABLE_TABLET | ORAL | Status: DC

## 2019-10-25 MED ORDER — SODIUM CHLORIDE 0.9 % WEIGHT BASED INFUSION
3.0000 mL/kg/h | INTRAVENOUS | Status: AC
  Administered 2019-10-25: 3 mL/kg/h via INTRAVENOUS

## 2019-10-25 MED ORDER — ONDANSETRON HCL 4 MG/2ML IJ SOLN: 4.0000 mg | Freq: Four times a day (QID) | INTRAMUSCULAR | Status: DC | PRN

## 2019-10-25 MED ORDER — HEPARIN (PORCINE) IN NACL 1000-0.9 UT/500ML-% IV SOLN
INTRAVENOUS | Status: DC | PRN
  Administered 2019-10-25 (×4): 500 mL

## 2019-10-25 MED ORDER — HYDRALAZINE HCL 20 MG/ML IJ SOLN: 10.0000 mg | INTRAMUSCULAR | Status: DC | PRN

## 2019-10-25 MED ORDER — LIDOCAINE HCL (PF) 1 % IJ SOLN
INTRAMUSCULAR | Status: AC
  Filled 2019-10-25: qty 30

## 2019-10-25 MED ORDER — FENTANYL CITRATE (PF) 100 MCG/2ML IJ SOLN
INTRAMUSCULAR | Status: DC | PRN
  Administered 2019-10-25: 25 ug via INTRAVENOUS

## 2019-10-25 SURGICAL SUPPLY — 10 items
CATH OPTITORQUE TIG 4.0 5F (CATHETERS) ×2 IMPLANT
DEVICE RAD COMP TR BAND LRG (VASCULAR PRODUCTS) ×2 IMPLANT
GLIDESHEATH SLEND SS 6F .021 (SHEATH) ×2 IMPLANT
GUIDEWIRE INQWIRE 1.5J.035X260 (WIRE) ×1 IMPLANT
INQWIRE 1.5J .035X260CM (WIRE) ×2
KIT HEART LEFT (KITS) ×2 IMPLANT
PACK CARDIAC CATHETERIZATION (CUSTOM PROCEDURE TRAY) ×2 IMPLANT
SHEATH PROBE COVER 6X72 (BAG) ×2 IMPLANT
TRANSDUCER W/STOPCOCK (MISCELLANEOUS) ×2 IMPLANT
TUBING CIL FLEX 10 FLL-RA (TUBING) ×2 IMPLANT

## 2019-10-25 NOTE — Progress Notes (Signed)
Pt got up and ambulated to the restroom with a steady gait to void.

## 2019-10-25 NOTE — Brief Op Note (Signed)
10/25/2019  1:56 PM  PATIENT:  CAILEB RHUE  86 y.o. male is with history of pacemaker placement who was referred by Dr. Kristeen Miss for invasive evaluation with cardiac catheterization for the symptoms concerning for progressive angina  PRE-OPERATIVE DIAGNOSIS:  unstable/progressive angina  POST-OPERATIVE DIAGNOSIS:  Severe Single-Vessel CAD with flush occlusion of the AV groove circumflex, LPL and PDA filled via septal collaterals. Small nondominant RCA with faint collaterals to small AV groove circumflex. Apparently normal LVEF and EDP.  PROCEDURE:  Procedure(s): LEFT HEART CATH AND CORONARY ANGIOGRAPHY (N/A) -> Right radial access-6 French sheath, ultrasound-guided  Take 4.0 catheter for left and right coronary angiography, angled pigtail catheter for LV gram.  ANESTHESIA:   local and IV sedation  EBL:  <20 mL  MEDICATIONS: IV heparin, 5000 units.  Contrast 75 mL   TR band placed for hemostasis 1250 Hours, 12 mL  DICTATION: .Note written in EPIC  PLAN OF CARE: Discharge to home after PACU  PATIENT DISPOSITION:  PACU - hemodynamically stable.   Delay start of Pharmacological VTE agent (>24hrs) due to surgical blood loss or risk of bleeding: not applicable3   Bryan Lemma, MD

## 2019-10-25 NOTE — Interval H&P Note (Signed)
History and Physical Interval Note:  Cath Lab Visit (complete for each Cath Lab visit)  Clinical Evaluation Leading to the Procedure:   ACS: No. /Considered to be progressive angina  Non-ACS:    Anginal Classification: CCS III  Anti-ischemic medical therapy: Minimal Therapy (1 class of medications)  Non-Invasive Test Results: No non-invasive testing performed  Prior CABG: No previous CABG   10/25/2019 12:01 PM   Colton Bennett

## 2019-10-25 NOTE — Discharge Instructions (Signed)
Radial Site Care  This sheet gives you information about how to care for yourself after your procedure. Your health care provider may also give you more specific instructions. If you have problems or questions, contact your health care provider. What can I expect after the procedure? After the procedure, it is common to have:  Bruising and tenderness at the catheter insertion area. Follow these instructions at home: Medicines  Take over-the-counter and prescription medicines only as told by your health care provider. Insertion site care  Follow instructions from your health care provider about how to take care of your insertion site. Make sure you: ? Wash your hands with soap and water before you change your bandage (dressing). If soap and water are not available, use hand sanitizer. ? Change your dressing as told by your health care provider. ? Leave stitches (sutures), skin glue, or adhesive strips in place. These skin closures may need to stay in place for 2 weeks or longer. If adhesive strip edges start to loosen and curl up, you may trim the loose edges. Do not remove adhesive strips completely unless your health care provider tells you to do that.  Check your insertion site every day for signs of infection. Check for: ? Redness, swelling, or pain. ? Fluid or blood. ? Pus or a bad smell. ? Warmth.  Do not take baths, swim, or use a hot tub until your health care provider approves.  You may shower 24-48 hours after the procedure, or as directed by your health care provider. ? Remove the dressing and gently wash the site with plain soap and water. ? Pat the area dry with a clean towel. ? Do not rub the site. That could cause bleeding.  Do not apply powder or lotion to the site. Activity   For 24 hours after the procedure, or as directed by your health care provider: ? Do not flex or bend the affected arm. ? Do not push or pull heavy objects with the affected arm. ? Do not  drive yourself home from the hospital or clinic. You may drive 24 hours after the procedure unless your health care provider tells you not to. ? Do not operate machinery or power tools.  Do not lift anything that is heavier than 10 lb (4.5 kg), or the limit that you are told, until your health care provider says that it is safe.  Ask your health care provider when it is okay to: ? Return to work or school. ? Resume usual physical activities or sports. ? Resume sexual activity. General instructions  If the catheter site starts to bleed, raise your arm and put firm pressure on the site. If the bleeding does not stop, get help right away. This is a medical emergency.  If you went home on the same day as your procedure, a responsible adult should be with you for the first 24 hours after you arrive home.  Keep all follow-up visits as told by your health care provider. This is important. Contact a health care provider if:  You have a fever.  You have redness, swelling, or yellow drainage around your insertion site. Get help right away if:  You have unusual pain at the radial site.  The catheter insertion area swells very fast.  The insertion area is bleeding, and the bleeding does not stop when you hold steady pressure on the area.  Your arm or hand becomes pale, cool, tingly, or numb. These symptoms may represent a serious problem   that is an emergency. Do not wait to see if the symptoms will go away. Get medical help right away. Call your local emergency services (911 in the [place] Do not drive yourself to the hospital. Summary  After the procedure, it is common to have bruising and tenderness at the site.  Follow instructions from your health care provider about how to take care of your radial site wound. Check the wound every day for signs of infection.  Do not lift anything that is heavier than 10 lb (4.5 kg), or the limit that you are told, until your health care provider says  that it is safe. This information is not intended to replace advice given to you by your health care provider. Make sure you discuss any questions you have with your health care provider. Document Revised: 08/04/2017 Document Reviewed: 08/04/2017 Elsevier Patient Education  2020 [employer]  

## 2019-10-25 NOTE — Interval H&P Note (Signed)
History and Physical Interval Note:  10/25/2019 12:01 PM  Colton Bennett  has presented today for surgery, with the diagnosis of unstable angina.  The various methods of treatment have been discussed with the patient and family. After consideration of risks, benefits and other options for treatment, the patient has consented to  Procedure(s): LEFT HEART CATH AND CORONARY ANGIOGRAPHY (N/A)  PERCUTANEOUS CORONARY INTERVENTION  as a surgical intervention.  The patient's history has been reviewed, patient examined, no change in status, stable for surgery.  I have reviewed the patient's chart and labs.  Questions were answered to the patient's satisfaction.     Bryan Lemma

## 2019-12-04 ENCOUNTER — Telehealth: Payer: Self-pay | Admitting: Internal Medicine

## 2019-12-04 NOTE — Telephone Encounter (Signed)
Transferred patient to [facility] to schedule appt with Dr. Johney Frame

## 2019-12-12 ENCOUNTER — Ambulatory Visit (INDEPENDENT_AMBULATORY_CARE_PROVIDER_SITE_OTHER): Payer: Medicare Other | Admitting: *Deleted

## 2019-12-12 DIAGNOSIS — I495 Sick sinus syndrome: Secondary | ICD-10-CM | POA: Diagnosis not present

## 2019-12-13 ENCOUNTER — Encounter: Payer: Self-pay | Admitting: Adult Health

## 2019-12-14 ENCOUNTER — Telehealth: Payer: Self-pay | Admitting: Emergency Medicine

## 2019-12-14 ENCOUNTER — Other Ambulatory Visit: Payer: Self-pay

## 2019-12-14 ENCOUNTER — Encounter: Payer: Self-pay | Admitting: Adult Health

## 2019-12-14 ENCOUNTER — Ambulatory Visit (INDEPENDENT_AMBULATORY_CARE_PROVIDER_SITE_OTHER): Payer: Medicare Other | Admitting: Adult Health

## 2019-12-14 VITALS — BP 113/58 | HR 55 | Ht 72.0 in | Wt 194.0 lb

## 2019-12-14 DIAGNOSIS — I200 Unstable angina: Secondary | ICD-10-CM | POA: Diagnosis not present

## 2019-12-14 DIAGNOSIS — G4733 Obstructive sleep apnea (adult) (pediatric): Secondary | ICD-10-CM

## 2019-12-14 DIAGNOSIS — Z9989 Dependence on other enabling machines and devices: Secondary | ICD-10-CM

## 2019-12-14 LAB — CUP PACEART REMOTE DEVICE CHECK
Battery Remaining Longevity: 86 mo
Battery Voltage: 3.01 V
Brady Statistic AP VP Percent: 0.02 %
Brady Statistic AP VS Percent: 10.22 %
Brady Statistic AS VP Percent: 0.05 %
Brady Statistic AS VS Percent: 89.72 %
Brady Statistic RA Percent Paced: 10.1 %
Brady Statistic RV Percent Paced: 0.06 %
Date Time Interrogation Session: 20210603081106
Implantable Lead Implant Date: 20170210
Implantable Lead Implant Date: 20170210
Implantable Lead Location: 753859
Implantable Lead Location: 753860
Implantable Lead Model: 5076
Implantable Lead Model: 5076
Implantable Pulse Generator Implant Date: 20170210
Lead Channel Impedance Value: 304 Ohm
Lead Channel Impedance Value: 361 Ohm
Lead Channel Impedance Value: 399 Ohm
Lead Channel Impedance Value: 456 Ohm
Lead Channel Pacing Threshold Amplitude: 0.625 V
Lead Channel Pacing Threshold Amplitude: 0.75 V
Lead Channel Pacing Threshold Pulse Width: 0.4 ms
Lead Channel Pacing Threshold Pulse Width: 0.4 ms
Lead Channel Sensing Intrinsic Amplitude: 1.125 mV
Lead Channel Sensing Intrinsic Amplitude: 1.125 mV
Lead Channel Sensing Intrinsic Amplitude: 16.375 mV
Lead Channel Sensing Intrinsic Amplitude: 16.375 mV
Lead Channel Setting Pacing Amplitude: 2 V
Lead Channel Setting Pacing Amplitude: 2.5 V
Lead Channel Setting Pacing Pulse Width: 0.4 ms
Lead Channel Setting Sensing Sensitivity: 2.8 mV

## 2019-12-14 NOTE — Telephone Encounter (Signed)
Patient called needing assistance sending scheduled transmission. Transmission received . 10/25/19 2 episodes of NSVT that were short. Device function WNL.

## 2019-12-14 NOTE — Progress Notes (Addendum)
PATIENT: Colton Bennett DOB: Jun 28, 1934  REASON FOR VISIT: follow up HISTORY FROM: patient  HISTORY OF PRESENT ILLNESS: Today 12/14/19:  Mr. [name] is a 86 year old male with a history of obstructive sleep apnea on CPAP. He returns today for follow-up. His download indicates that he uses machine nightly for compliance of 100%. He uses machine greater than 4 hours each night. On average he uses his machine 7 hours and 17 minutes. His residual AHI is 2.8 on 8 cm of water with an EPR of one. Reports CPAP works well.   HISTORY 12/07/2018: I reviewed his CPAP compliance data from 11/07/2018 through 12/06/2018 which is a total of 30 days, during which time he used his machine every night with percent used days greater than 4 hours at 100%, indicating superb compliance with an average usage of 7 hours and 29 minutes, residual AHI at goal at 3.9/h, leak acceptable with a 95th percentile at 9.1 L/min on a pressure set at 8 cm without EPR.  His set up date was 09/20/2018.  REVIEW OF SYSTEMS: Out of a complete 14 system review of symptoms, the patient complains only of the following symptoms, and all other reviewed systems are negative.  FSS 22 ESS 3  ALLERGIES: No Known Allergies  HOME MEDICATIONS: Outpatient Medications Prior to Visit  Medication Sig Dispense Refill  . alfuzosin (UROXATRAL) 10 MG 24 hr tablet Take 10 mg by mouth daily before breakfast.     . Ascorbic Acid (VITAMIN C) 1000 MG tablet Take 1,000 mg by mouth daily.    Marland Kitchen aspirin EC 81 MG tablet Take 1 tablet (81 mg total) by mouth daily.    . Cyanocobalamin (B-12) 5000 MCG CAPS Take 5,000 mcg by mouth daily.    . isosorbide mononitrate (IMDUR) 30 MG 24 hr tablet Take 1 tablet (30 mg total) by mouth daily. 30 tablet 11  . metoprolol succinate (TOPROL XL) 25 MG 24 hr tablet Take 1 tablet (25 mg total) by mouth daily. 90 tablet 3  . nitroGLYCERIN (NITROSTAT) 0.4 MG SL tablet Place 1 tablet (0.4 mg total) under the tongue every 5 (five)  minutes as needed for chest pain. 25 tablet 6  . omeprazole (PRILOSEC) 40 MG capsule Take 40 mg by mouth daily.     No facility-administered medications prior to visit.    PAST MEDICAL HISTORY: Past Medical History:  Diagnosis Date  . Chest pain   . Dizziness   . DOE (dyspnea on exertion)   . GERD (gastroesophageal reflux disease)   . History of cardiovascular stress test    ETT-Myoview (06/07/13): No ischemia, EF 56%; normal study.  . History of echocardiogram    Echocardiogram (01/2013): EF of 50-55%, normal wall motion, trivial AI, mild MR, mild to moderate LAE  . OSA on CPAP   . Presence of permanent cardiac pacemaker   . SSS (sick sinus syndrome) (HCC)   . Syncope, near     PAST SURGICAL HISTORY: Past Surgical History:  Procedure Laterality Date  . EP IMPLANTABLE DEVICE N/A 08/23/2015   MDT Advisa DR MRI compatible pacemaker implanted by Dr Johney Frame for sick sinus and syncope  . EP IMPLANTABLE DEVICE N/A 08/23/2015   Procedure: Loop Recorder Removal;  Surgeon: Hillis Range, MD;  [facility]: [facility] INVASIVE CV LAB;  Service: Cardiovascular;  Laterality: N/A;  . LEFT HEART CATH AND CORONARY ANGIOGRAPHY N/A 10/25/2019   Procedure: LEFT HEART CATH AND CORONARY ANGIOGRAPHY;  Surgeon: Marykay Lex, MD;  Location: [facility]  INVASIVE CV LAB;  Service: Cardiovascular;  Laterality: N/A;  . LOOP RECORDER IMPLANT  September 2014  . LOOP RECORDER IMPLANT N/A 04/05/2013   Procedure: LOOP RECORDER IMPLANT;  Surgeon: Gardiner Rhyme, MD;  Location: [facility] CATH LAB;  Service: Cardiovascular;  Laterality: N/A;    FAMILY HISTORY: Family History  Problem Relation Age of Onset  . Other Father        Died at age 73  of unknown cause  . Lung cancer Brother   . Dementia Brother   . Suicidality Brother        died of suicide  . Lymphoma Brother   . CAD Neg Hx     SOCIAL HISTORY: Social History   Socioeconomic History  . Marital status: Married    Spouse name: Not on file  . Number of children: 1  .  Years of education: Not on file  . Highest education level: Not on file  Occupational History  . Occupation: 'retired  Tobacco Use  . Smoking status: Former Smoker    Packs/day: 3.00    Years: 30.00    Pack years: 90.00    Types: Cigarettes    Quit date: 02/01/1982    Years since quitting: 37.8  . Smokeless tobacco: Never Used  Substance and Sexual Activity  . Alcohol use: No    Alcohol/week: 0.0 standard drinks  . Drug use: No  . Sexual activity: Not on file  Other Topics Concern  . Not on file  Social History Narrative  . Not on file   Social Determinants of Health   Financial Resource Strain:   . Difficulty of Paying Living Expenses:   Food Insecurity:   . Worried About Programme researcher, broadcasting/film/video in the Last Year:   . Barista in the Last Year:   Transportation Needs:   . Freight forwarder (Medical):   Marland Kitchen Lack of Transportation (Non-Medical):   Physical Activity:   . Days of Exercise per Week:   . Minutes of Exercise per Session:   Stress:   . Feeling of Stress :   Social Connections:   . Frequency of Communication with Friends and Family:   . Frequency of Social Gatherings with Friends and Family:   . Attends Religious Services:   . Active Member of Clubs or Organizations:   . Attends Banker Meetings:   Marland Kitchen Marital Status:   Intimate Partner Violence:   . Fear of Current or Ex-Partner:   . Emotionally Abused:   Marland Kitchen Physically Abused:   . Sexually Abused:       PHYSICAL EXAM  Vitals:   12/14/19 1245  BP: (!) 113/58  Pulse: (!) 55  Weight: 194 lb (88 kg)  Height: 6' (1.829 m)   Body mass index is 26.31 kg/m.  Generalized: Well developed, in no acute distress  Chest: Lungs clear to auscultation bilaterally  Neurological examination  Mentation: Alert oriented to time, place, history taking. Follows all commands speech and language fluent Cranial nerve II-XII: Extraocular movements were full, visual field were full on confrontational  test Head turning and shoulder shrug  were normal and symmetric. Motor: The motor testing reveals 5 over 5 strength of all 4 extremities. Good symmetric motor tone is noted throughout.  Sensory: Sensory testing is intact to soft touch on all 4 extremities. No evidence of extinction is noted.  Gait and station: Gait is normal.    DIAGNOSTIC DATA (LABS, IMAGING, TESTING) - I reviewed patient records,  labs, notes, testing and imaging myself where available.  Lab Results  Component Value Date   WBC 7.6 10/20/2019   HGB 12.9 (L) 10/20/2019   HCT 38.4 10/20/2019   MCV 92 10/20/2019   PLT 202 10/20/2019      Component Value Date/Time   NA 141 10/20/2019 1432   K 4.3 10/20/2019 1432   CL 105 10/20/2019 1432   CO2 23 10/20/2019 1432   GLUCOSE 83 10/20/2019 1432   GLUCOSE 108 (H) 08/24/2015 0301   BUN 14 10/20/2019 1432   CREATININE 0.97 10/20/2019 1432   CALCIUM 9.1 10/20/2019 1432   PROT 6.3 02/01/2013 0955   ALBUMIN 4.1 02/01/2013 0955   AST 18 02/01/2013 0955   ALT 13 02/01/2013 0955   ALKPHOS 81 02/01/2013 0955   BILITOT 0.4 02/01/2013 0955   GFRNONAA 72 10/20/2019 1432   GFRAA 83 10/20/2019 1432   No results found for: CHOL, HDL, LDLCALC, LDLDIRECT, TRIG, CHOLHDL Lab Results  Component Value Date   HGBA1C 5.8 (H) 02/01/2013   No results found for: VITAMINB12 Lab Results  Component Value Date   TSH 3.440 02/01/2013      ASSESSMENT AND PLAN 86 y.o. year old male  has a past medical history of Chest pain, Dizziness, DOE (dyspnea on exertion), GERD (gastroesophageal reflux disease), History of cardiovascular stress test, History of echocardiogram, OSA on CPAP, Presence of permanent cardiac pacemaker, SSS (sick sinus syndrome) ([place]), and Syncope, near. here with:  1. OSA on CPAP  - CPAP compliance excellent - Good treatment of AHI  - Encourage patient to use CPAP nightly and > 4 hours each night - F/U in 1 year or sooner if needed   I spent 20 minutes of  face-to-face and non-face-to-face time with patient.  This included previsit chart review, lab review, study review, order entry, electronic health record documentation, patient education.  Ward Givens, MSN, NP-C 12/14/2019, 1:05 PM [facility] [address], [place], [place] [address] [phone]  I reviewed the above note and documentation by the Nurse Practitioner and agree with the history, exam, assessment and plan as outlined above. I was available for consultation. Star Age, MD, PhD [facility])

## 2019-12-14 NOTE — Patient Instructions (Signed)

## 2019-12-18 NOTE — Progress Notes (Signed)
Remote pacemaker transmission.   

## 2019-12-19 ENCOUNTER — Telehealth: Payer: Self-pay | Admitting: Cardiovascular Disease

## 2019-12-19 NOTE — Telephone Encounter (Signed)
Pt wants to know if his wife can come with him to his upcoming appointment 12/21/19. He says he is hard of hearing and needs a second set of ears. Please let the patient know what the office decides

## 2019-12-19 NOTE — Telephone Encounter (Signed)
Called and advised patient's wife that per Dr. Elease Hashimoto, she may accompany him to the appointment. I have noted this in patient's appointment notes. Wife thanked me for the cal.

## 2019-12-21 ENCOUNTER — Encounter: Payer: Self-pay | Admitting: Cardiovascular Disease

## 2019-12-21 ENCOUNTER — Other Ambulatory Visit: Payer: Self-pay

## 2019-12-21 ENCOUNTER — Ambulatory Visit (INDEPENDENT_AMBULATORY_CARE_PROVIDER_SITE_OTHER): Payer: Medicare Other | Admitting: Cardiovascular Disease

## 2019-12-21 VITALS — BP 106/50 | HR 54 | Ht 72.0 in | Wt 195.8 lb

## 2019-12-21 DIAGNOSIS — I495 Sick sinus syndrome: Secondary | ICD-10-CM

## 2019-12-21 DIAGNOSIS — I200 Unstable angina: Secondary | ICD-10-CM

## 2019-12-21 DIAGNOSIS — I2510 Atherosclerotic heart disease of native coronary artery without angina pectoris: Secondary | ICD-10-CM

## 2019-12-21 LAB — LIPID PANEL
Chol/HDL Ratio: 4.6 ratio (ref 0.0–5.0)
Cholesterol, Total: 142 mg/dL (ref 100–199)
HDL: 31 mg/dL — ABNORMAL LOW (ref >39)
LDL Chol Calc (NIH): 99 mg/dL (ref 0–99)
Triglycerides: 60 mg/dL (ref 0–149)
VLDL Cholesterol Cal: 12 mg/dL (ref 5–40)

## 2019-12-21 NOTE — Patient Instructions (Signed)
Medication Instructions:  Your physician has recommended you make the following change in your medication:  STOP Isosorbide mononitrate (Imdur)  *If you need a refill on your cardiac medications before your next appointment, please call your pharmacy*   Lab Work: TODAY - cholesterol If you have labs (blood work) drawn today and your tests are completely normal, you will receive your results only by: Marland Kitchen MyChart Message (if you have MyChart) OR . A paper copy in the mail If you have any lab test that is abnormal or we need to change your treatment, we will call you to review the results.   Testing/Procedures: None Ordered   Follow-Up: At [facility], you and your health needs are our priority.  As part of our continuing mission to provide you with exceptional heart care, we have created designated Provider Care Teams.  These Care Teams include your primary Cardiologist (physician) and Advanced Practice Providers (APPs -  Physician Assistants and Nurse Practitioners) who all work together to provide you with the care you need, when you need it.  Your next appointment:   6 month(s)  The format for your next appointment:   In Person  Provider:   You may see Kristeen Miss, MD or one of the following Advanced Practice Providers on your designated Care Team:    Tereso Newcomer, PA-C  [place], [place]

## 2019-12-21 NOTE — Progress Notes (Signed)
Colton Bennett Date of Birth  [birth year]       [facility]    [place] [address]  [address] [place], [place]  [address]   [place], [place]  [address] [phone]     [phone]   Fax  [phone]    Fax [phone]  Problem List: 1. Syncope  2. Dyspnea 3.  Sick sinus syndrome -  S/p pacer    Colton Bennett is a 86 yo with a recent episode of collapse.  He was seen with his wife who provided most of the accurate history - he seems to play his symptoms down quite a bit.    He got out of his truck and collapsed in the parking lot.  He was talking and remained conscious the whole time.   He had severe nausea and vomitting after that.   He remained unsteady and was not able to walk for several hours.  He did not want to go to the doctor or ER.    He felt better after several days.  He were 30 day event monitor. He was found to have normal sinus rhythm. He had occasional episodes of frequent premature ventricular contractions-occasionally in a bigeminal pattern.  He did not have any symptoms while wearing the monitor.  He had an echocardiogram which revealed normal left ventricle systolic function with an ejection fraction of 50-55%. He had normal wall motion. He had trivial aortic insufficiency, mild mitral regurgitation. The left atrium was mildly to moderately dilated.  His EEG was normal.  He's had 2 different sleep studies. The first study was July 31 which revealed severe sleep apnea during REM sleep. He had a second sleep study the results of which are not known at this time.  His MRI was normal. The head MRA is normal.  September 27, 2013:  Colton Bennett has had an implantable loop recorder placed.   He's had a few episodes of presyncope.  He exercises on occasion. He denies any chest pain or shortness breath.  He was found to have a 3 second pause with his ILR.    Sept. 16,2015:  Colton Bennett has done well.  No further syncopal episodes.    Still has the implantable loop.    Has been busy working in his garden this summer.   Sept. 19 , 2016: Had an episode of dizziness 2 weeks ago .  No CP   Sept. 7, 2017:  Colton Bennett is seen today for follow up visit No further passing out episodes.  No CP or dyspnea  Sept. 24, 2018  Colton Bennett is seen , some DOE (according to wife) but he thinks hes doing fine .  No CP , or dizziness.  Gets pacer checked remotely   Sept. 24, 2019: Colton Bennett is seen back today for follow-up of his sick sinus syndrome.  He had a pacemaker placed several years ago and feels well. Also has a history of obstructive sleep apnea.  No CP or dyspnea  Exercised until his foot started hurting . No CP or dyspnea.   August 23, 2019: Colton Bennett is seen back today for follow-up of his sick sinus syndrome.  He had a pacemaker placed in his followed up with Dr. Melany Guernsey and  the EP team.  He was recently seen by Dr. Delano Metz for bilateral pneumonia in January.  He lost his sense of tast and smell - has returned now.  Never had a fever .  Has  tested negative for COVID  X 2 .  Took him a long time to get over his pneumonia   He was seen by Pecolia Ades, NP in November, 2020 for chest pain. Lexiscan Myoview study at that time revealed no ischemia and normal ejection fraction.  Is not having any other issues.     December 21, 2019  Colton Bennett is seen for follow up of his sick sinus syndrome. Has had a pacer placed.  No cp or dyspnea. Cath in April 2021 shows occlusion of the LCx just after a large marginal .   The distal LCx fills fairly well with collaterals    Current Outpatient Medications on File Prior to Visit  Medication Sig Dispense Refill  . alfuzosin (UROXATRAL) 10 MG 24 hr tablet Take 10 mg by mouth daily before breakfast.     . Ascorbic Acid (VITAMIN C) 1000 MG tablet Take 1,000 mg by mouth daily.    Marland Kitchen aspirin EC 81 MG tablet Take 1 tablet (81 mg total) by mouth daily.    . Cyanocobalamin (B-12) 5000 MCG CAPS Take 5,000 mcg by  mouth daily.    . isosorbide mononitrate (IMDUR) 30 MG 24 hr tablet Take 1 tablet (30 mg total) by mouth daily. 30 tablet 11  . metoprolol succinate (TOPROL XL) 25 MG 24 hr tablet Take 1 tablet (25 mg total) by mouth daily. 90 tablet 3  . nitroGLYCERIN (NITROSTAT) 0.4 MG SL tablet Place 1 tablet (0.4 mg total) under the tongue every 5 (five) minutes as needed for chest pain. 25 tablet 6  . omeprazole (PRILOSEC) 40 MG capsule Take 40 mg by mouth daily.     No current facility-administered medications on file prior to visit.    No Known Allergies  Past Medical History:  Diagnosis Date  . Chest pain   . Dizziness   . DOE (dyspnea on exertion)   . GERD (gastroesophageal reflux disease)   . History of cardiovascular stress test    ETT-Myoview (06/07/13): No ischemia, EF 56%; normal study.  . History of echocardiogram    Echocardiogram (01/2013): EF of 50-55%, normal wall motion, trivial AI, mild MR, mild to moderate LAE  . OSA on CPAP   . Presence of permanent cardiac pacemaker   . SSS (sick sinus syndrome) ([place])   . Syncope, near     Past Surgical History:  Procedure Laterality Date  . EP IMPLANTABLE DEVICE N/A 08/23/2015   MDT Advisa DR MRI compatible pacemaker implanted by Dr Rayann Heman for sick sinus and syncope  . EP IMPLANTABLE DEVICE N/A 08/23/2015   Procedure: Loop Recorder Removal;  Surgeon: Thompson Grayer, MD;  Location: [place] CV LAB;  Service: Cardiovascular;  Laterality: N/A;  . LEFT HEART CATH AND CORONARY ANGIOGRAPHY N/A 10/25/2019   Procedure: LEFT HEART CATH AND CORONARY ANGIOGRAPHY;  Surgeon: Leonie Man, MD;  Location: [place] CV LAB;  Service: Cardiovascular;  Laterality: N/A;  . LOOP RECORDER IMPLANT  September 2014  . LOOP RECORDER IMPLANT N/A 04/05/2013   Procedure: LOOP RECORDER IMPLANT;  Surgeon: Coralyn Mark, MD;  Location: [place] CATH LAB;  Service: Cardiovascular;  Laterality: N/A;    Social History   Tobacco Use  Smoking Status Former Smoker  .  Packs/day: 3.00  . Years: 30.00  . Pack years: 90.00  . Types: Cigarettes  . Quit date: 02/01/1982  . Years since quitting: 37.9  Smokeless Tobacco Never Used    Social History   Substance and Sexual Activity  Alcohol  Use No  . Alcohol/week: 0.0 standard drinks    Family History  Problem Relation Age of Onset  . Other Father        Died at age 57  of unknown cause  . Lung cancer Brother   . Dementia Brother   . Suicidality Brother        died of suicide  . Lymphoma Brother   . CAD Neg Hx     Reviw of Systems:  Reviewed in the HPI.  All other systems are negative.  Physical Exam: Blood pressure (!) 106/50, pulse (!) 54, height 6' (1.829 m), weight 195 lb 12 oz (88.8 kg), SpO2 97 %.  GEN:  Well nourished, well developed in no acute distress HEENT: Normal NECK: No JVD; No carotid bruits LYMPHATICS: No lymphadenopathy CARDIAC: RRR , no murmurs, rubs, gallops RESPIRATORY:  Clear to auscultation without rales, wheezing or rhonchi  ABDOMEN: Soft, non-tender, non-distended MUSCULOSKELETAL:  No edema; No deformity  SKIN: Warm and dry NEUROLOGIC:  Alert and oriented x 3   ECG:    Assessment / Plan:   1.  Sick sinus syndrome - s/p pacer       2.  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  The circumflex fills very well with collateral vessels. He is not having any angina.  He seems to be very stable.  We will discontinue the isosorbide since he really does not have any critical stenosis.  I have instructed him to not throw it away and to start back if he starts having episodes of angina.  I will see him back in 6 months for follow-up visit.  We will check a lipid profile because of his coronary disease.  We we will want his LDL to be 70 or below.  Kristeen Miss, MD  12/21/2019 9:49 AM    [facility] [address] [place],  [address] [place], [place]  [address] Pager [phone] Phone: [phone];  Fax: [phone]

## 2019-12-22 ENCOUNTER — Telehealth: Payer: Self-pay | Admitting: *Deleted

## 2019-12-22 DIAGNOSIS — I2510 Atherosclerotic heart disease of native coronary artery without angina pectoris: Secondary | ICD-10-CM

## 2019-12-22 DIAGNOSIS — E785 Hyperlipidemia, unspecified: Secondary | ICD-10-CM

## 2019-12-22 MED ORDER — ATORVASTATIN CALCIUM 40 MG PO TABS: 40.0000 mg | ORAL_TABLET | Freq: Every day | ORAL | 3 refills | Status: DC

## 2019-12-22 NOTE — Telephone Encounter (Signed)
-----   Message from Vesta Mixer, MD sent at 12/22/2019  8:28 AM EDT ----- LDL is 99. Start atorvastatin 40 mg a day  Check lipids , liver enz, bmp

## 2019-12-22 NOTE — Telephone Encounter (Signed)
Patient notified.  Will send prescription to [facility] in [place].  Patient will come in for fasting lab work on September 3,2021

## 2020-01-10 ENCOUNTER — Ambulatory Visit (INDEPENDENT_AMBULATORY_CARE_PROVIDER_SITE_OTHER): Payer: Medicare Other | Admitting: Internal Medicine

## 2020-01-10 ENCOUNTER — Other Ambulatory Visit: Payer: Self-pay

## 2020-01-10 ENCOUNTER — Encounter: Payer: Self-pay | Admitting: Internal Medicine

## 2020-01-10 VITALS — BP 114/62 | HR 55 | Ht 72.0 in | Wt 193.8 lb

## 2020-01-10 DIAGNOSIS — I2510 Atherosclerotic heart disease of native coronary artery without angina pectoris: Secondary | ICD-10-CM | POA: Diagnosis not present

## 2020-01-10 DIAGNOSIS — I200 Unstable angina: Secondary | ICD-10-CM

## 2020-01-10 DIAGNOSIS — G4733 Obstructive sleep apnea (adult) (pediatric): Secondary | ICD-10-CM | POA: Diagnosis not present

## 2020-01-10 DIAGNOSIS — I495 Sick sinus syndrome: Secondary | ICD-10-CM

## 2020-01-10 DIAGNOSIS — I472 Ventricular tachycardia: Secondary | ICD-10-CM | POA: Diagnosis not present

## 2020-01-10 DIAGNOSIS — I4729 Other ventricular tachycardia: Secondary | ICD-10-CM

## 2020-01-10 DIAGNOSIS — E785 Hyperlipidemia, unspecified: Secondary | ICD-10-CM

## 2020-01-10 LAB — CUP PACEART INCLINIC DEVICE CHECK
Battery Remaining Longevity: 78 mo
Battery Voltage: 3.01 V
Brady Statistic AP VP Percent: 0.01 %
Brady Statistic AP VS Percent: 4.62 %
Brady Statistic AS VP Percent: 0.04 %
Brady Statistic AS VS Percent: 95.33 %
Brady Statistic RA Percent Paced: 4.62 %
Brady Statistic RV Percent Paced: 0.05 %
Date Time Interrogation Session: 20210630144000
Implantable Lead Implant Date: 20170210
Implantable Lead Implant Date: 20170210
Implantable Lead Location: 753859
Implantable Lead Location: 753860
Implantable Lead Model: 5076
Implantable Lead Model: 5076
Implantable Pulse Generator Implant Date: 20170210
Lead Channel Impedance Value: 323 Ohm
Lead Channel Impedance Value: 380 Ohm
Lead Channel Impedance Value: 399 Ohm
Lead Channel Impedance Value: 475 Ohm
Lead Channel Pacing Threshold Amplitude: 0.75 V
Lead Channel Pacing Threshold Amplitude: 0.75 V
Lead Channel Pacing Threshold Pulse Width: 0.4 ms
Lead Channel Pacing Threshold Pulse Width: 0.4 ms
Lead Channel Sensing Intrinsic Amplitude: 1.9 mV
Lead Channel Sensing Intrinsic Amplitude: 18 mV
Lead Channel Setting Pacing Amplitude: 2 V
Lead Channel Setting Pacing Amplitude: 2.5 V
Lead Channel Setting Pacing Pulse Width: 0.4 ms
Lead Channel Setting Sensing Sensitivity: 2.8 mV

## 2020-01-10 MED ORDER — ROSUVASTATIN CALCIUM 20 MG PO TABS: 20.0000 mg | ORAL_TABLET | Freq: Every day | ORAL | 3 refills | Status: DC

## 2020-01-10 NOTE — Progress Notes (Signed)
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  Since last being seen in our clinic, the patient reports doing very well.  Today, he denies symptoms of palpitations, chest pain, shortness of breath,  lower extremity edema, dizziness, presyncope, or syncope.  He is having trouble sleeping and feels very strongly that this is due to lipitor.  The patient is otherwise without complaint today.   Past Medical History:  Diagnosis Date   Chest pain    Dizziness    DOE (dyspnea on exertion)    GERD (gastroesophageal reflux disease)    History of cardiovascular stress test    ETT-Myoview (06/07/13): No ischemia, EF 56%; normal study.   History of echocardiogram    Echocardiogram (01/2013): EF of 50-55%, normal wall motion, trivial AI, mild MR, mild to moderate LAE   OSA on CPAP    Presence of permanent cardiac pacemaker    SSS (sick sinus syndrome) (HCC)    Syncope, near    Past Surgical History:  Procedure Laterality Date   EP IMPLANTABLE DEVICE N/A 08/23/2015   MDT Advisa DR MRI compatible pacemaker implanted by Dr Johney Frame for sick sinus and syncope   EP IMPLANTABLE DEVICE N/A 08/23/2015   Procedure: Loop Recorder Removal;  Surgeon: Hillis Range, MD;  Location: [facility] INVASIVE CV LAB;  Service: Cardiovascular;  Laterality: N/A;   LEFT HEART CATH AND CORONARY ANGIOGRAPHY N/A 10/25/2019   Procedure: LEFT HEART CATH AND CORONARY ANGIOGRAPHY;  Surgeon: Marykay Lex, MD;  Location: [facility] INVASIVE CV LAB;  Service: Cardiovascular;  Laterality: N/A;   LOOP RECORDER IMPLANT  September 2014   LOOP RECORDER IMPLANT N/A 04/05/2013   Procedure: LOOP RECORDER IMPLANT;  Surgeon: Gardiner Rhyme, MD;  Location: [facility] CATH LAB;  Service: Cardiovascular;  Laterality: N/A;    ROS- all systems are reviewed and negative except as per HPI above  Current Outpatient Medications   Medication Sig Dispense Refill   alfuzosin (UROXATRAL) 10 MG 24 hr tablet Take 10 mg by mouth daily before breakfast.      Ascorbic Acid (VITAMIN C) 1000 MG tablet Take 1,000 mg by mouth daily.     aspirin EC 81 MG tablet Take 1 tablet (81 mg total) by mouth daily.     atorvastatin (LIPITOR) 40 MG tablet Take 1 tablet (40 mg total) by mouth daily. 90 tablet 3   Cyanocobalamin (B-12) 5000 MCG CAPS Take 5,000 mcg by mouth daily.     metoprolol succinate (TOPROL XL) 25 MG 24 hr tablet Take 1 tablet (25 mg total) by mouth daily. 90 tablet 3   nitroGLYCERIN (NITROSTAT) 0.4 MG SL tablet Place 1 tablet (0.4 mg total) under the tongue every 5 (five) minutes as needed for chest pain. 25 tablet 6   omeprazole (PRILOSEC) 40 MG capsule Take 40 mg by mouth daily.     No current facility-administered medications for this visit.    Physical Exam: Vitals:   01/10/20 1449  BP: 114/62  Pulse: (!) 55  SpO2: 94%  Weight: 193 lb 12.8 oz (87.9 kg)  Height: 6' (1.829 m)    GEN- The patient is well appearing, alert and oriented x 3 today.   Head- normocephalic, atraumatic Eyes-  Sclera clear, conjunctiva pink Ears- hearing intact Oropharynx- clear Lungs-  normal work of breathing Chest- pacemaker pocket is well healed Heart- Regular rate and rhythm  GI- soft  Extremities- no clubbing, cyanosis, or edema  Pacemaker interrogation- reviewed in detail today,  See PACEART report  ekg tracing ordered today is personally reviewed and shows sinus bradycardia (50 bpm)  Assessment and Plan:  1. Symptomatic sinus bradycardia  Normal pacemaker function See Pace Art report No changes today he is not device dependant today  2. NSVT Noted on pacemaker Nonsustained and asymptomatic Cath 4/21 is reviewed. EF is normal Continue beta blocker therapy  3. CAD He has known CAD and no ischemic symptoms currently He feels that his lipitor is causing difficulty sleeping and wishes to change.  I will  ask our pharmacy team to assist with trying a different statin.  4. OSA compliance with CPAP encouraged  Risks, benefits and potential toxicities for medications prescribed and/or refilled reviewed with patient today.   Return to see EP APP in a year  Hillis Range MD, [facility] 01/10/2020 3:11 PM

## 2020-01-10 NOTE — Patient Instructions (Addendum)
Medication Instructions:  Your physician has recommended you make the following change in your medication:  1 stop atorvastatin 2 start Rosuvastatin 20mg  1 tablet by mouth daily   *If you need a refill on your cardiac medications before your next appointment, please call your pharmacy*  Lab Work: None ordered.  If you have labs (blood work) drawn today and your tests are completely normal, you will receive your results only by: MyChart Message (if you have MyChart) OR . A paper copy in the mail If you have any lab test that is abnormal or we need to change your treatment, we will call you to review the results.  Testing/Procedures: None ordered.  Follow-Up: At [facility], you and your health needs are our priority.  As part of our continuing mission to provide you with exceptional heart care, we have created designated Provider Care Teams.  These Care Teams include your primary Cardiologist (physician) and Advanced Practice Providers (APPs -  Physician Assistants and Nurse Practitioners) who all work together to provide you with the care you need, when you need it.  We recommend signing up for the patient portal called "MyChart".  Sign up information is provided on this After Visit Summary.  MyChart is used to connect with patients for Virtual Visits (Telemedicine).  Patients are able to view lab/test results, encounter notes, upcoming appointments, etc.  Non-urgent messages can be sent to your provider as well.   To learn more about what you can do with MyChart, go to [facility].    Your next appointment:   Your physician wants you to follow-up in: 1 year with [email or web address] You will receive a reminder letter in the mail two months in advance. If you don't receive a letter, please call our office to schedule the follow-up appointment.  Remote monitoring is used to monitor your Pacemaker from home. This monitoring reduces the number of office visits required to check your  device to one time per year. It allows Francis Dowse to keep an eye on the functioning of your device to ensure it is working properly. You are scheduled for a device check from home on 03/12/2020. You may send your transmission at any time that day. If you have a wireless device, the transmission will be sent automatically. After your physician reviews your transmission, you will receive a postcard with your next transmission date.  Other Instructions:

## 2020-03-12 ENCOUNTER — Ambulatory Visit (INDEPENDENT_AMBULATORY_CARE_PROVIDER_SITE_OTHER): Payer: Medicare Other | Admitting: *Deleted

## 2020-03-12 DIAGNOSIS — R55 Syncope and collapse: Secondary | ICD-10-CM

## 2020-03-12 LAB — CUP PACEART REMOTE DEVICE CHECK
Battery Remaining Longevity: 79 mo
Battery Voltage: 3.01 V
Brady Statistic AP VP Percent: 0.04 %
Brady Statistic AP VS Percent: 20.32 %
Brady Statistic AS VP Percent: 0.04 %
Brady Statistic AS VS Percent: 79.6 %
Brady Statistic RA Percent Paced: 20.17 %
Brady Statistic RV Percent Paced: 0.08 %
Date Time Interrogation Session: 20210831081718
Implantable Lead Implant Date: 20170210
Implantable Lead Implant Date: 20170210
Implantable Lead Location: 753859
Implantable Lead Location: 753860
Implantable Lead Model: 5076
Implantable Lead Model: 5076
Implantable Pulse Generator Implant Date: 20170210
Lead Channel Impedance Value: 304 Ohm
Lead Channel Impedance Value: 380 Ohm
Lead Channel Impedance Value: 380 Ohm
Lead Channel Impedance Value: 456 Ohm
Lead Channel Pacing Threshold Amplitude: 0.5 V
Lead Channel Pacing Threshold Amplitude: 0.625 V
Lead Channel Pacing Threshold Pulse Width: 0.4 ms
Lead Channel Pacing Threshold Pulse Width: 0.4 ms
Lead Channel Sensing Intrinsic Amplitude: 1.375 mV
Lead Channel Sensing Intrinsic Amplitude: 1.375 mV
Lead Channel Sensing Intrinsic Amplitude: 16.625 mV
Lead Channel Sensing Intrinsic Amplitude: 16.625 mV
Lead Channel Setting Pacing Amplitude: 2 V
Lead Channel Setting Pacing Amplitude: 2.5 V
Lead Channel Setting Pacing Pulse Width: 0.4 ms
Lead Channel Setting Sensing Sensitivity: 2.8 mV

## 2020-03-13 NOTE — Progress Notes (Signed)
Remote pacemaker transmission.   

## 2020-03-15 ENCOUNTER — Other Ambulatory Visit: Payer: Self-pay

## 2020-03-15 ENCOUNTER — Other Ambulatory Visit: Payer: Medicare Other | Admitting: *Deleted

## 2020-03-15 ENCOUNTER — Telehealth: Payer: Self-pay

## 2020-03-15 DIAGNOSIS — E785 Hyperlipidemia, unspecified: Secondary | ICD-10-CM

## 2020-03-15 DIAGNOSIS — I2510 Atherosclerotic heart disease of native coronary artery without angina pectoris: Secondary | ICD-10-CM

## 2020-03-15 LAB — BASIC METABOLIC PANEL
BUN/Creatinine Ratio: 13 (ref 10–24)
BUN: 14 mg/dL (ref 8–27)
CO2: 26 mmol/L (ref 20–29)
Calcium: 9.4 mg/dL (ref 8.6–10.2)
Chloride: 104 mmol/L (ref 96–106)
Creatinine, Ser: 1.09 mg/dL (ref 0.76–1.27)
GFR calc Af Amer: 72 mL/min/{1.73_m2} (ref >59)
GFR calc non Af Amer: 62 mL/min/{1.73_m2} (ref >59)
Glucose: 94 mg/dL (ref 65–99)
Potassium: 4.6 mmol/L (ref 3.5–5.2)
Sodium: 141 mmol/L (ref 134–144)

## 2020-03-15 LAB — HEPATIC FUNCTION PANEL
ALT: 8 IU/L (ref 0–44)
AST: 11 IU/L (ref 0–40)
Albumin: 4 g/dL (ref 3.6–4.6)
Alkaline Phosphatase: 92 IU/L (ref 48–121)
Bilirubin Total: 0.6 mg/dL (ref 0.0–1.2)
Bilirubin, Direct: 0.14 mg/dL (ref 0.00–0.40)
Total Protein: 6.9 g/dL (ref 6.0–8.5)

## 2020-03-15 LAB — LIPID PANEL
Chol/HDL Ratio: 5.1 ratio — ABNORMAL HIGH (ref 0.0–5.0)
Cholesterol, Total: 142 mg/dL (ref 100–199)
HDL: 28 mg/dL — ABNORMAL LOW (ref >39)
LDL Chol Calc (NIH): 99 mg/dL (ref 0–99)
Triglycerides: 77 mg/dL (ref 0–149)
VLDL Cholesterol Cal: 15 mg/dL (ref 5–40)

## 2020-03-15 NOTE — Telephone Encounter (Signed)
-----   Message from Vesta Mixer, MD sent at 03/15/2020  5:03 PM EDT ----- His LDL is 99.  He has coronary artery disease and so his goal is less than 70.  He is on rosuvastatin 20 mg a day. Add Zetia 10 mg a day.  Check lipids, liver enzymes, basic metabolic profile in 3 months.

## 2020-03-15 NOTE — Telephone Encounter (Signed)
-----   Message from Philip J Nahser, MD sent at 03/15/2020  5:03 PM EDT ----- °His LDL is 99.  He has coronary artery disease and so his goal is less than 70.  He is on rosuvastatin 20 mg a day. °Add Zetia 10 mg a day.  Check lipids, liver enzymes, basic metabolic profile in 3 months. ° °

## 2020-03-15 NOTE — Telephone Encounter (Signed)
Reviewed results with patient who verbalized understanding.   The patient reports he stopped taking rosuvastatin after 2 weeks because his body "just couldn't tolerate it." His legs ached and he had brain fog. Per Dr. Elease Hashimoto, referred and scheduled patient in the Lipid Clinic 9/7. The patient was grateful for call and agrees with treatment plan.

## 2020-03-18 NOTE — Progress Notes (Signed)
Patient ID: Colton Bennett                 DOB: Jan 01, 1934                    MRN: [id number]     HPI: Colton Bennett is a 86 y.o. male patient referred to lipid clinic by Dr. Elease Hashimoto. PMH is significant for CAD, sick sinus syndrome - s/p pacer (12/2019), unstable angina, OSA, former smoker.  Patient started on atorvastatin 40 mg daily for lipid management in 12/2019, however after 2 weeks stopped taking medication due to trouble sleeping and was switched to rosuvastatin 20 mg daily. Patient then stopped rosuvastatin after two weeks due to leg aches and brain fog.  Patient presents today in good spirits. Patient is currently not on any lipid-lowering medications. Has tried atorvastatin and rosuvastatin in the past and reports "not feeling right" - stopped taking in July 2021. Diet consists of non-healthy and sugary foods (listed below). Exercise consists of working in the yard and walking around "a little bit."  Current Medications: none Intolerances: rosuvastatin 20 mg daily (leg cramps and brain fog), atorvastatin 40 mg daily (insomnia) Risk Factors: CAD, former smoker, unstable angina LDL goal: < 70 mg/dL  Diet:  Breakfast: frosty flakes, bacon, eggs, hash browns, sausage, pancakes  Lunch: chicken sandwich, tomato sandwich, fast food Dinner: pork chops, hot-dogs, beef roast, hamburgers, pork Snacks: cookies, moon pie, pineapple pie Drink: mountain dew soda and water   Exercise: work in the yard, walk around a little bit,   Family History: No CVD history in family per patient   Social History: Former smoker (quit 1983 - 90 pack year)  Labs: 03/15/20: LDL 99, TG 77, TC 142, HDL 28 (no therapy) 02/26/20: LDL 70, TG 90, TC 118, HDL 30 [facility]) 12/21/19: LDL 88, TG 60, TC 142, HDL 31 (no therapy)  Past Medical History:  Diagnosis Date  . Chest pain   . Dizziness   . DOE (dyspnea on exertion)   . GERD (gastroesophageal reflux disease)   . History of cardiovascular stress  test    ETT-Myoview (06/07/13): No ischemia, EF 56%; normal study.  . History of echocardiogram    Echocardiogram (01/2013): EF of 50-55%, normal wall motion, trivial AI, mild MR, mild to moderate LAE  . OSA on CPAP   . Presence of permanent cardiac pacemaker   . SSS (sick sinus syndrome) (HCC)   . Syncope, near     Current Outpatient Medications on File Prior to Visit  Medication Sig Dispense Refill  . alfuzosin (UROXATRAL) 10 MG 24 hr tablet Take 10 mg by mouth daily before breakfast.     . Ascorbic Acid (VITAMIN C) 1000 MG tablet Take 1,000 mg by mouth daily.    Marland Kitchen aspirin EC 81 MG tablet Take 1 tablet (81 mg total) by mouth daily.    . Cyanocobalamin (B-12) 5000 MCG CAPS Take 5,000 mcg by mouth daily.    . metoprolol succinate (TOPROL XL) 25 MG 24 hr tablet Take 1 tablet (25 mg total) by mouth daily. 90 tablet 3  . nitroGLYCERIN (NITROSTAT) 0.4 MG SL tablet Place 1 tablet (0.4 mg total) under the tongue every 5 (five) minutes as needed for chest pain. 25 tablet 6  . omeprazole (PRILOSEC) 40 MG capsule Take 40 mg by mouth daily.     No current facility-administered medications on file prior to visit.    No Known Allergies  Assessment/Plan:  1. Hyperlipidemia - LDL above goal < 70 mg/dL due to history of CAD. Patient statin intolerant and requires additional LDL lowering. Will start Repatha 140 mg/dL subcutaneously every 2 weeks. Patient verbalized that he can afford a 90 day supply for $29.99 with [employer] prescription insurance. Counseled on clinical benefits, side effects, and proper administration technique with teach-back method. Encouraged patient to eat a healthy diet full of vegetables, fruit and lean meats (chicken, [place], fish). Try to limit carbs (bread, pasta, sugar, rice), red meat consumption, and sugary foods. Encouraged patient to walk around neighborhood or park for at least 20 minutes every day. Will check a direct LDL today as [employer] requires an LDL > 100 for  PCSK9-inhibitor approval, then will send prior authorization and call patient when Repatha is ready for pick up from pharmacy and will schedule fasting lipid panel and LFTs in 3 months.   Fabio Neighbors, PharmD PGY2 Ambulatory Care Pharmacy Resident [facility] [address], [place], [place] [address] Phone: [phone]; Fax: [phone]

## 2020-03-19 ENCOUNTER — Other Ambulatory Visit: Payer: Self-pay

## 2020-03-19 ENCOUNTER — Ambulatory Visit (INDEPENDENT_AMBULATORY_CARE_PROVIDER_SITE_OTHER): Payer: Medicare Other | Admitting: Pharmacist

## 2020-03-19 DIAGNOSIS — I200 Unstable angina: Secondary | ICD-10-CM | POA: Diagnosis not present

## 2020-03-19 DIAGNOSIS — E785 Hyperlipidemia, unspecified: Secondary | ICD-10-CM | POA: Diagnosis not present

## 2020-03-19 NOTE — Patient Instructions (Addendum)
Nice to see you today!  Aim for a diet full of vegetables, fruit and lean meats (chicken, [place], fish). Try to limit carbs (bread, pasta, sugar, rice) and red meat consumption.  Your goal LDL is <70 mg/dL, you're currently at 99 mg/dL  Medication Changes:  We will check a Direct LDL today and then request approval of Repatha from your insurance. Once appproved, we will give you a call when your prescription is ready for pick-up and set up fasting labs for 3 months.   Inject Repatha 140 mg/ml to the lower abdomen every 2 weeks.   Please give [place] a call at [phone] with any questions or concerns.  For PCSK9i, inject once every other week (any day of the week that works for you) into the fatty skin of stomach, upper outer thigh or back of the arm. Clean the site with soap and warm water or an alcohol pad. Keep the medication in the fridge until you are ready to give your dose, then take it out and let warm up to room temperature for 30-60 mins.

## 2020-03-20 LAB — LDL CHOLESTEROL, DIRECT: LDL Direct: 86 mg/dL (ref 0–99)

## 2020-03-22 ENCOUNTER — Telehealth: Payer: Self-pay | Admitting: Pharmacist

## 2020-03-22 DIAGNOSIS — E785 Hyperlipidemia, unspecified: Secondary | ICD-10-CM

## 2020-03-22 MED ORDER — EZETIMIBE 10 MG PO TABS: 10.0000 mg | ORAL_TABLET | Freq: Every day | ORAL | 3 refills | Status: DC

## 2020-03-22 NOTE — Telephone Encounter (Signed)
Contacted patient regarding denied Repatha PA by [employer]. Since patient is statin-intolerant, [employer] requires patients to try ezetimibe 10 mg daily for at least 4-6 weeks prior to Repatha initiation. Patient is amendable to trying ezetimibe. Prescription sent to preferred pharmacy. Scheduled lipid panel and LFTs labs in 3 months.  Fabio Neighbors, PharmD PGY2 Ambulatory Care Pharmacy Resident [facility] [address], [place], [place] [address] Phone: [phone]; Fax: [phone]

## 2020-05-15 ENCOUNTER — Other Ambulatory Visit: Payer: Self-pay

## 2020-05-15 DIAGNOSIS — E785 Hyperlipidemia, unspecified: Secondary | ICD-10-CM

## 2020-05-15 MED ORDER — EZETIMIBE 10 MG PO TABS: 10.0000 mg | ORAL_TABLET | Freq: Every day | ORAL | 2 refills | Status: DC

## 2020-05-15 NOTE — Addendum Note (Signed)
Addended by: Margaret Pyle D on: 05/15/2020 05:28 PM   Modules accepted: Orders

## 2020-06-11 ENCOUNTER — Ambulatory Visit (INDEPENDENT_AMBULATORY_CARE_PROVIDER_SITE_OTHER): Payer: Medicare Other

## 2020-06-11 DIAGNOSIS — R55 Syncope and collapse: Secondary | ICD-10-CM | POA: Diagnosis not present

## 2020-06-11 LAB — CUP PACEART REMOTE DEVICE CHECK
Battery Remaining Longevity: 80 mo
Battery Voltage: 3.01 V
Brady Statistic AP VP Percent: 0.02 %
Brady Statistic AP VS Percent: 13.43 %
Brady Statistic AS VP Percent: 0.04 %
Brady Statistic AS VS Percent: 86.51 %
Brady Statistic RA Percent Paced: 13.42 %
Brady Statistic RV Percent Paced: 0.06 %
Date Time Interrogation Session: 20211130111208
Implantable Lead Implant Date: 20170210
Implantable Lead Implant Date: 20170210
Implantable Lead Location: 753859
Implantable Lead Location: 753860
Implantable Lead Model: 5076
Implantable Lead Model: 5076
Implantable Pulse Generator Implant Date: 20170210
Lead Channel Impedance Value: 304 Ohm
Lead Channel Impedance Value: 361 Ohm
Lead Channel Impedance Value: 437 Ohm
Lead Channel Impedance Value: 456 Ohm
Lead Channel Pacing Threshold Amplitude: 0.5 V
Lead Channel Pacing Threshold Amplitude: 0.875 V
Lead Channel Pacing Threshold Pulse Width: 0.4 ms
Lead Channel Pacing Threshold Pulse Width: 0.4 ms
Lead Channel Sensing Intrinsic Amplitude: 17.625 mV
Lead Channel Sensing Intrinsic Amplitude: 17.625 mV
Lead Channel Sensing Intrinsic Amplitude: 2.125 mV
Lead Channel Sensing Intrinsic Amplitude: 2.125 mV
Lead Channel Setting Pacing Amplitude: 2 V
Lead Channel Setting Pacing Amplitude: 2.5 V
Lead Channel Setting Pacing Pulse Width: 0.4 ms
Lead Channel Setting Sensing Sensitivity: 2.8 mV

## 2020-06-17 NOTE — Progress Notes (Signed)
Remote pacemaker transmission.   

## 2020-06-20 ENCOUNTER — Other Ambulatory Visit: Payer: Self-pay

## 2020-06-20 ENCOUNTER — Other Ambulatory Visit: Payer: Medicare Other | Admitting: *Deleted

## 2020-06-20 DIAGNOSIS — E785 Hyperlipidemia, unspecified: Secondary | ICD-10-CM

## 2020-06-21 LAB — HEPATIC FUNCTION PANEL
ALT: 9 IU/L (ref 0–44)
AST: 9 IU/L (ref 0–40)
Albumin: 3.8 g/dL (ref 3.6–4.6)
Alkaline Phosphatase: 84 IU/L (ref 44–121)
Bilirubin Total: 1.2 mg/dL (ref 0.0–1.2)
Bilirubin, Direct: 0.4 mg/dL (ref 0.00–0.40)
Total Protein: 6.5 g/dL (ref 6.0–8.5)

## 2020-06-21 LAB — LIPID PANEL
Chol/HDL Ratio: 2.9 ratio (ref 0.0–5.0)
Cholesterol, Total: 98 mg/dL — ABNORMAL LOW (ref 100–199)
HDL: 34 mg/dL — ABNORMAL LOW (ref >39)
LDL Chol Calc (NIH): 51 mg/dL (ref 0–99)
Triglycerides: 53 mg/dL (ref 0–149)
VLDL Cholesterol Cal: 13 mg/dL (ref 5–40)

## 2020-07-14 ENCOUNTER — Encounter: Payer: Self-pay | Admitting: Cardiovascular Disease

## 2020-07-14 NOTE — Progress Notes (Signed)
[name] Date of Birth  [birth year]       [facility]    [place] [address]  [address] [place], [place]  [address]   [place], [place]  [address] [phone]     [phone]   Fax  [phone]    Fax [phone]  Problem List: 1. Syncope  2. Dyspnea 3.  Sick sinus syndrome -  S/p pacer    Elmon is a 87 yo with a recent episode of collapse.  He was seen with his wife who provided most of the accurate history - he seems to play his symptoms down quite a bit.    He got out of his truck and collapsed in the parking lot.  He was talking and remained conscious the whole time.   He had severe nausea and vomitting after that.   He remained unsteady and was not able to walk for several hours.  He did not want to go to the doctor or ER.    He felt better after several days.  He were 30 day event monitor. He was found to have normal sinus rhythm. He had occasional episodes of frequent premature ventricular contractions-occasionally in a bigeminal pattern.  He did not have any symptoms while wearing the monitor.  He had an echocardiogram which revealed normal left ventricle systolic function with an ejection fraction of 50-55%. He had normal wall motion. He had trivial aortic insufficiency, mild mitral regurgitation. The left atrium was mildly to moderately dilated.  His EEG was normal.  He's had 2 different sleep studies. The first study was July 31 which revealed severe sleep apnea during REM sleep. He had a second sleep study the results of which are not known at this time.  His MRI was normal. The head MRA is normal.  September 27, 2013:  Mr. Doxtater has had an implantable loop recorder placed.   He's had a few episodes of presyncope.  He exercises on occasion. He denies any chest pain or shortness breath.  He was found to have a 3 second pause with his ILR.    Sept. 16,2015:  Jaquis has done well.  No further syncopal episodes.    Still has the implantable loop.    Has been busy working in his garden this summer.   Sept. 19 , 2016: Had an episode of dizziness 2 weeks ago .  No CP   Sept. 7, 2017:  Al is seen today for follow up visit No further passing out episodes.  No CP or dyspnea  Sept. 24, 2018  Al is seen , some DOE (according to wife) but he thinks hes doing fine .  No CP , or dizziness.  Gets pacer checked remotely   Sept. 24, 2019: Al is seen back today for follow-up of his sick sinus syndrome.  He had a pacemaker placed several years ago and feels well. Also has a history of obstructive sleep apnea.  No CP or dyspnea  Exercised until his foot started hurting . No CP or dyspnea.   August 23, 2019: Hessie Diener is seen back today for follow-up of his sick sinus syndrome.  He had a pacemaker placed in his followed up with Dr. Graciela Husbands and  the EP team.  He was recently seen by Dr. Delano Metz for bilateral pneumonia in January.  He lost his sense of tast and smell - has returned now.  Never had a fever .  Has  tested negative for COVID  X 2 .  Took him a long time to get over his pneumonia   He was seen by Pecolia Ades, NP in November, 2020 for chest pain. Lexiscan Myoview study at that time revealed no ischemia and normal ejection fraction.  Is not having any other issues.     December 21, 2019  Al is seen for follow up of his sick sinus syndrome. Has had a pacer placed.  No cp or dyspnea. Cath in April 2021 shows occlusion of the LCx just after a large marginal .   The distal LCx fills fairly well with collaterals   Jan. 4, 2022: Al is seen for follow up of his SSS and CAD.   Has LCx occlusion with good collateral filling no CP , dyspnea, syncope Recent lipids look good on Zetia      Current Outpatient Medications on File Prior to Visit  Medication Sig Dispense Refill  . alfuzosin (UROXATRAL) 10 MG 24 hr tablet Take 10 mg by mouth daily before breakfast.     . Ascorbic Acid (VITAMIN C) 1000  MG tablet Take 1,000 mg by mouth daily.    Marland Kitchen aspirin EC 81 MG tablet Take 1 tablet (81 mg total) by mouth daily.    . Cyanocobalamin (B-12) 5000 MCG CAPS Take 5,000 mcg by mouth daily.    Marland Kitchen ezetimibe (ZETIA) 10 MG tablet Take 1 tablet (10 mg total) by mouth daily. 90 tablet 2  . nitroGLYCERIN (NITROSTAT) 0.4 MG SL tablet Place 1 tablet (0.4 mg total) under the tongue every 5 (five) minutes as needed for chest pain. 25 tablet 6  . omeprazole (PRILOSEC) 40 MG capsule Take 40 mg by mouth daily.    . metoprolol succinate (TOPROL XL) 25 MG 24 hr tablet Take 1 tablet (25 mg total) by mouth daily. (Patient not taking: Reported on 07/16/2020) 90 tablet 3   No current facility-administered medications on file prior to visit.    No Known Allergies  Past Medical History:  Diagnosis Date  . Chest pain   . Dizziness   . DOE (dyspnea on exertion)   . GERD (gastroesophageal reflux disease)   . History of cardiovascular stress test    ETT-Myoview (06/07/13): No ischemia, EF 56%; normal study.  . History of echocardiogram    Echocardiogram (01/2013): EF of 50-55%, normal wall motion, trivial AI, mild MR, mild to moderate LAE  . OSA on CPAP   . Presence of permanent cardiac pacemaker   . SSS (sick sinus syndrome) ([place])   . Syncope, near     Past Surgical History:  Procedure Laterality Date  . EP IMPLANTABLE DEVICE N/A 08/23/2015   MDT Advisa DR MRI compatible pacemaker implanted by Dr Rayann Heman for sick sinus and syncope  . EP IMPLANTABLE DEVICE N/A 08/23/2015   Procedure: Loop Recorder Removal;  Surgeon: Thompson Grayer, MD;  Location: [facility];  Service: Cardiovascular;  Laterality: N/A;  . LEFT HEART CATH AND CORONARY ANGIOGRAPHY N/A 10/25/2019   Procedure: LEFT HEART CATH AND CORONARY ANGIOGRAPHY;  Surgeon: Leonie Man, MD;  Location: [facility];  Service: Cardiovascular;  Laterality: N/A;  . LOOP RECORDER IMPLANT  September 2014  . LOOP RECORDER IMPLANT N/A 04/05/2013   Procedure:  LOOP RECORDER IMPLANT;  Surgeon: Coralyn Mark, MD;  Location: [facility];  Service: Cardiovascular;  Laterality: N/A;    Social History   Tobacco Use  Smoking Status Former Smoker  . Packs/day: 3.00  .  Years: 30.00  . Pack years: 90.00  . Types: Cigarettes  . Quit date: 02/01/1982  . Years since quitting: 38.4  Smokeless Tobacco Never Used    Social History   Substance and Sexual Activity  Alcohol Use No  . Alcohol/week: 0.0 standard drinks    Family History  Problem Relation Age of Onset  . Other Father        Died at age 56  of unknown cause  . Lung cancer Brother   . Dementia Brother   . Suicidality Brother        died of suicide  . Lymphoma Brother   . CAD Neg Hx     Reviw of Systems:  Reviewed in the HPI.  All other systems are negative.  Physical Exam: Blood pressure (!) 104/58, pulse 82, height 6' (1.829 m), weight 198 lb 6.4 oz (90 kg), SpO2 96 %.  GEN:  Well nourished, well developed in no acute distress HEENT: Normal NECK: No JVD; No carotid bruits LYMPHATICS: No lymphadenopathy CARDIAC: RRR , no murmurs, rubs, gallops RESPIRATORY:  Clear to auscultation without rales, wheezing or rhonchi  ABDOMEN: Soft, non-tender, non-distended MUSCULOSKELETAL:  No edema; No deformity  SKIN: Warm and dry NEUROLOGIC:  Alert and oriented x 3   ECG:    Assessment / Plan:   1.  Sick sinus syndrome - s/p pacer   ,  Followed by EP  2.  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   No angina   Cont meds    Kristeen Miss, MD  07/16/2020 1:35 PM    [facility] [address] [place],  [address] [place], [place]  [address] Pager [phone] Phone: [phone]; Fax: [phone]

## 2020-07-16 ENCOUNTER — Encounter: Payer: Self-pay | Admitting: Cardiovascular Disease

## 2020-07-16 ENCOUNTER — Ambulatory Visit (INDEPENDENT_AMBULATORY_CARE_PROVIDER_SITE_OTHER): Payer: Medicare Other | Admitting: Cardiovascular Disease

## 2020-07-16 ENCOUNTER — Other Ambulatory Visit: Payer: Self-pay

## 2020-07-16 VITALS — BP 104/58 | HR 82 | Ht 72.0 in | Wt 198.4 lb

## 2020-07-16 DIAGNOSIS — I495 Sick sinus syndrome: Secondary | ICD-10-CM

## 2020-07-16 DIAGNOSIS — I2510 Atherosclerotic heart disease of native coronary artery without angina pectoris: Secondary | ICD-10-CM | POA: Diagnosis not present

## 2020-07-16 NOTE — Patient Instructions (Signed)
Medication Instructions:  Your provider recommends that you continue on your current medications as directed. Please refer to the Current Medication list given to you today.   *If you need a refill on your cardiac medications before your next appointment, please call your pharmacy*  Follow-Up: At [facility], you and your health needs are our priority.  As part of our continuing mission to provide you with exceptional heart care, we have created designated Provider Care Teams.  These Care Teams include your primary Cardiologist (physician) and Advanced Practice Providers (APPs -  Physician Assistants and Nurse Practitioners) who all work together to provide you with the care you need, when you need it. Your next appointment:   12 month(s) The format for your next appointment:   In Person Provider:   You may see Philip Nahser, MD or one of the following Advanced Practice Providers on your designated Care Team:    Scott Weaver, PA-C  Vin Bhagat, PA-C   

## 2020-09-10 ENCOUNTER — Ambulatory Visit (INDEPENDENT_AMBULATORY_CARE_PROVIDER_SITE_OTHER): Payer: Medicare Other

## 2020-09-10 DIAGNOSIS — I495 Sick sinus syndrome: Secondary | ICD-10-CM

## 2020-09-10 LAB — CUP PACEART REMOTE DEVICE CHECK
Battery Remaining Longevity: 77 mo
Battery Voltage: 3.01 V
Brady Statistic AP VP Percent: 0 %
Brady Statistic AP VS Percent: 0.55 %
Brady Statistic AS VP Percent: 0.04 %
Brady Statistic AS VS Percent: 99.4 %
Brady Statistic RA Percent Paced: 0.55 %
Brady Statistic RV Percent Paced: 0.04 %
Date Time Interrogation Session: 20220301095551
Implantable Lead Implant Date: 20170210
Implantable Lead Implant Date: 20170210
Implantable Lead Location: 753859
Implantable Lead Location: 753860
Implantable Lead Model: 5076
Implantable Lead Model: 5076
Implantable Pulse Generator Implant Date: 20170210
Lead Channel Impedance Value: 304 Ohm
Lead Channel Impedance Value: 342 Ohm
Lead Channel Impedance Value: 418 Ohm
Lead Channel Impedance Value: 437 Ohm
Lead Channel Pacing Threshold Amplitude: 0.5 V
Lead Channel Pacing Threshold Amplitude: 1 V
Lead Channel Pacing Threshold Pulse Width: 0.4 ms
Lead Channel Pacing Threshold Pulse Width: 0.4 ms
Lead Channel Sensing Intrinsic Amplitude: 1.25 mV
Lead Channel Sensing Intrinsic Amplitude: 1.25 mV
Lead Channel Sensing Intrinsic Amplitude: 16.75 mV
Lead Channel Sensing Intrinsic Amplitude: 16.75 mV
Lead Channel Setting Pacing Amplitude: 2 V
Lead Channel Setting Pacing Amplitude: 2.5 V
Lead Channel Setting Pacing Pulse Width: 0.4 ms
Lead Channel Setting Sensing Sensitivity: 2.8 mV

## 2020-09-18 NOTE — Progress Notes (Signed)
Remote pacemaker transmission.   

## 2020-12-03 ENCOUNTER — Other Ambulatory Visit: Payer: Self-pay

## 2020-12-03 ENCOUNTER — Ambulatory Visit: Payer: Medicare Other | Attending: Family Medicine

## 2020-12-03 VITALS — BP 119/58

## 2020-12-03 DIAGNOSIS — M25551 Pain in right hip: Secondary | ICD-10-CM | POA: Diagnosis not present

## 2020-12-03 DIAGNOSIS — M6281 Muscle weakness (generalized): Secondary | ICD-10-CM | POA: Insufficient documentation

## 2020-12-03 DIAGNOSIS — M5136 Other intervertebral disc degeneration, lumbar region: Secondary | ICD-10-CM | POA: Diagnosis present

## 2020-12-03 NOTE — Therapy (Signed)
[facility] [address] [place], [place], [address] Phone: [phone]   Fax:  [phone]  Physical Therapy Evaluation  Patient Details  Name: Colton Bennett MRN: [id number] Date of Birth: 01-Aug-1933 Referring Provider (PT): Kip Corrington   Encounter Date: 12/03/2020   PT End of Session - 12/03/20 0940    Visit Number 1    Number of Visits 6    Date for PT Re-Evaluation 01/14/21    PT Start Time 0850    PT Stop Time 0935    PT Time Calculation (min) 45 min    Activity Tolerance Patient tolerated treatment well    Behavior During Therapy [facility] for tasks assessed/performed           Past Medical History:  Diagnosis Date  . Chest pain   . Dizziness   . DOE (dyspnea on exertion)   . GERD (gastroesophageal reflux disease)   . History of cardiovascular stress test    ETT-Myoview (06/07/13): No ischemia, EF 56%; normal study.  . History of echocardiogram    Echocardiogram (01/2013): EF of 50-55%, normal wall motion, trivial AI, mild MR, mild to moderate LAE  . OSA on CPAP   . Presence of permanent cardiac pacemaker   . SSS (sick sinus syndrome) (HCC)   . Syncope, near     Past Surgical History:  Procedure Laterality Date  . EP IMPLANTABLE DEVICE N/A 08/23/2015   MDT Advisa DR MRI compatible pacemaker implanted by Dr Johney Frame for sick sinus and syncope  . EP IMPLANTABLE DEVICE N/A 08/23/2015   Procedure: Loop Recorder Removal;  Surgeon: Hillis Range, MD;  Location: [facility] INVASIVE CV LAB;  Service: Cardiovascular;  Laterality: N/A;  . LEFT HEART CATH AND CORONARY ANGIOGRAPHY N/A 10/25/2019   Procedure: LEFT HEART CATH AND CORONARY ANGIOGRAPHY;  Surgeon: Marykay Lex, MD;  Location: [facility] INVASIVE CV LAB;  Service: Cardiovascular;  Laterality: N/A;  . LOOP RECORDER IMPLANT  September 2014  . LOOP RECORDER IMPLANT N/A 04/05/2013   Procedure: LOOP RECORDER IMPLANT;  Surgeon: Gardiner Rhyme, MD;  Location: [facility] CATH LAB;  Service:  Cardiovascular;  Laterality: N/A;    Vitals:   12/03/20 0906  BP: (!) 119/58      Subjective Assessment - 12/03/20 0906    Subjective Patient reports to clinic with history of low back and R hip pain. He reports that he no longer has pain for the last 2 weeks as the steroid injection and previous antinflamator y has been heling. He currently likes to go fishing and works in his yard.    Pertinent History Pacemaker    Limitations Walking;Other (comment)   strength   Currently in Pain? No/denies              [facility] PT Assessment - 12/03/20 0900      Assessment   Medical Diagnosis Right Hip Painl; Spondylosis of Lumbar Region    Referring Provider (PT) Kip Corrington    Onset Date/Surgical Date 09/05/20    Next MD Visit 12/16/20      Precautions   Precautions ICD/Pacemaker      Balance Screen   Has the patient fallen in the past 6 months No    Has the patient had a decrease in activity level because of a fear of falling?  No    Is the patient reluctant to leave their home because of a fear of falling?  No      Home Tourist information centre manager  residence      Prior Function   Level of Independence Independent      Cognition   Overall Cognitive Status Within Functional Limits for tasks assessed   Hearing Loss significant     Functional Tests   Functional tests Sit to Stand;Single Leg Squat;Single leg stance      Single Leg Squat   Comments Staggered- able to complete without UE assist      Single Leg Stance   Comments L: 20 sec; R : 15sec      Sit to Stand   Comments 5 times sit to stand 14 sec      ROM / Strength   AROM / PROM / Strength AROM;Strength      AROM   Overall AROM  Within functional limits for tasks performed    AROM Assessment Site Hip;Lumbar    Right/Left Hip Right;Left      Strength   Strength Assessment Site Hip    Right/Left Hip Right;Left    Right Hip Flexion 4/5    Right Hip Extension 4/5    Right Hip External Rotation   4-/5    Right Hip Internal Rotation 5/5    Right Hip ABduction 3+/5    Left Hip Flexion 3-/5    Left Hip Extension 3-/5    Left Hip External Rotation 3/5    Left Hip Internal Rotation 4-/5    Left Hip ABduction 4/5      Flexibility   Soft Tissue Assessment /Muscle Length yes    Hamstrings 90/90 test L: lacking 15 deg ; R lacking 20 deg      Palpation   Spinal mobility hypomobilie lumbar spine; limited SI mobility    SI assessment  minimally TTP at R SI      Special Tests    Special Tests Lumbar;Hip Special Tests      Transfers   Five time sit to stand comments  14 sec                      Objective measurements completed on examination: See above findings.       [facility] Adult PT Treatment/Exercise - 12/03/20 0900      Exercises   Exercises Lumbar;Knee/Hip      Lumbar Exercises: Supine   Straight Leg Raise --   2 sets of 10 reps   Other Supine Lumbar Exercises Hip bridge 2 x 10; SLR 2 x 10; sidelying SLR - with tactile cues to maintain pelvic neutral 2 x10      Knee/Hip Exercises: Seated   Sit to Sand --   staggered stance to encourage SLS sit to stand 2 x 10                      PT Long Term Goals - 12/03/20 1008      PT LONG TERM GOAL #1   Title Independent with HEP    Time 6    Period Weeks    Status New    Target Date 01/14/21      PT LONG TERM GOAL #2   Title Improved hip flexion bil 5/5 MMT    Time 6    Period Weeks    Status New      PT LONG TERM GOAL #3   Title Patient will be able to perform repeated lifting for home yard work without limitation.    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 He has since had a reduction in pain and able to participate in intake session well without much limitation. Patient presents with  moderate proximal hip strength deficits and will benefit from skilled PT to address limitations. The patient was educated on typical progression of arhritic changes in the spine, anti-inflammatory relief, and benefit of isolated exercises to reduce degenerative processes. Recommendations for plan of care as follows.    Personal Factors and Comorbidities Age;Other    Examination-Activity Limitations Other    Examination-Participation Restrictions Yard Work    Stability/Clinical Decision Making Stable/Uncomplicated    Clinical Decision Making Low    Rehab Potential Excellent    PT Frequency 1x / week    PT Duration 6 weeks           Patient will benefit from skilled therapeutic intervention in order to improve the following deficits and impairments:  Decreased balance,Decreased mobility,Difficulty walking,Hypomobility,Decreased strength  Visit Diagnosis: Pain in right hip  DDD (degenerative disc disease), lumbar  Muscle weakness (generalized)     Problem List Patient Active Problem List   Diagnosis Date Noted  . CAD (coronary artery disease) 07/16/2020  . Hyperlipidemia 03/19/2020  . Unstable/progressive angina 10/20/2019  . Sick sinus syndrome (HCC) 08/22/2015  . Obstructive sleep apnea 08/20/2014  . Chest pain, mid sternal 06/01/2013  . Dizziness 04/05/2013  . DOE (dyspnea on exertion) 04/03/2013  . Near syncope 04/03/2013    Levonne Spiller PT, DPT  12/03/2020, 10:29 AM  [facility] [address] [place], [place], [address] Phone: [phone]   Fax:  [phone]  Name: Colton Bennett MRN: [id number] Date of Birth: 08-01-1933

## 2020-12-10 ENCOUNTER — Ambulatory Visit (INDEPENDENT_AMBULATORY_CARE_PROVIDER_SITE_OTHER): Payer: Medicare Other

## 2020-12-10 ENCOUNTER — Encounter: Payer: Self-pay | Admitting: Physical Therapy

## 2020-12-10 ENCOUNTER — Ambulatory Visit: Payer: Medicare Other | Admitting: Physical Therapy

## 2020-12-10 ENCOUNTER — Other Ambulatory Visit: Payer: Self-pay

## 2020-12-10 DIAGNOSIS — I495 Sick sinus syndrome: Secondary | ICD-10-CM

## 2020-12-10 DIAGNOSIS — M6281 Muscle weakness (generalized): Secondary | ICD-10-CM

## 2020-12-10 DIAGNOSIS — M25551 Pain in right hip: Secondary | ICD-10-CM | POA: Diagnosis not present

## 2020-12-10 DIAGNOSIS — M5136 Other intervertebral disc degeneration, lumbar region: Secondary | ICD-10-CM

## 2020-12-10 NOTE — Therapy (Signed)
[facility] [address] [place], [place], [address] Phone: [phone]   Fax:  [phone]  Physical Therapy Treatment  Patient Details  Name: Colton Bennett MRN: [id number] Date of Birth: [birth year] Referring Provider (PT): Kip Corrington   Encounter Date: 12/10/2020   PT End of Session - 12/10/20 0904    Visit Number 2    Number of Visits 6    Date for PT Re-Evaluation 01/14/21    PT Start Time 0903    PT Stop Time 0943    PT Time Calculation (min) 40 min    Activity Tolerance Patient tolerated treatment well    Behavior During Therapy [facility] for tasks assessed/performed           Past Medical History:  Diagnosis Date  . Chest pain   . Dizziness   . DOE (dyspnea on exertion)   . GERD (gastroesophageal reflux disease)   . History of cardiovascular stress test    ETT-Myoview (06/07/13): No ischemia, EF 56%; normal study.  . History of echocardiogram    Echocardiogram (01/2013): EF of 50-55%, normal wall motion, trivial AI, mild MR, mild to moderate LAE  . OSA on CPAP   . Presence of permanent cardiac pacemaker   . SSS (sick sinus syndrome) (HCC)   . Syncope, near     Past Surgical History:  Procedure Laterality Date  . EP IMPLANTABLE DEVICE N/A 08/23/2015   MDT Advisa DR MRI compatible pacemaker implanted by Dr Johney Frame for sick sinus and syncope  . EP IMPLANTABLE DEVICE N/A 08/23/2015   Procedure: Loop Recorder Removal;  Surgeon: Hillis Range, MD;  Location: [facility] INVASIVE CV LAB;  Service: Cardiovascular;  Laterality: N/A;  . LEFT HEART CATH AND CORONARY ANGIOGRAPHY N/A 10/25/2019   Procedure: LEFT HEART CATH AND CORONARY ANGIOGRAPHY;  Surgeon: Marykay Lex, MD;  Location: [facility] INVASIVE CV LAB;  Service: Cardiovascular;  Laterality: N/A;  . LOOP RECORDER IMPLANT  September 2014  . LOOP RECORDER IMPLANT N/A 04/05/2013   Procedure: LOOP RECORDER IMPLANT;  Surgeon: Gardiner Rhyme, MD;  Location: [facility] CATH LAB;  Service:  Cardiovascular;  Laterality: N/A;    There were no vitals filed for this visit.   Subjective Assessment - 12/10/20 0903    Subjective Patient denied any LBP or hip pain upon arrival.    Pertinent History Pacemaker    Limitations Walking;Other (comment)    Currently in Pain? No/denies              [facility] PT Assessment - 12/10/20 0001      Assessment   Medical Diagnosis Right Hip Painl; Spondylosis of Lumbar Region    Referring Provider (PT) Kip Corrington    Onset Date/Surgical Date 09/05/20    Next MD Visit 12/17/2020      Precautions   Precautions ICD/Pacemaker                         [facility] Adult PT Treatment/Exercise - 12/10/20 0001      Lumbar Exercises: Aerobic   Nustep L3, seat 12 x15 min      Lumbar Exercises: Seated   Sit to Stand 15 reps;Limitations    Sit to Stand Limitations staggered    Other Seated Lumbar Exercises 3D core press with ball x20 reps      Lumbar Exercises: Supine   Bridge 20 reps;2 seconds    Straight Leg Raise 20 reps    Straight Leg Raises Limitations BLE  Lumbar Exercises: Sidelying   Clam Both;20 reps;2 seconds    Hip Abduction Both;20 reps;2 seconds      Knee/Hip Exercises: Standing   Heel Raises Both;20 reps    Heel Raises Limitations B toe raise x20 reps    Hip Flexion AROM;Both;2 sets;10 reps    Hip Abduction Stengthening;Both;2 sets;10 reps    Hip Extension Stengthening;Both;2 sets;10 reps                       PT Long Term Goals - 12/03/20 1008      PT LONG TERM GOAL #1   Title Independent with HEP    Time 6    Period Weeks    Status New    Target Date 01/14/21      PT LONG TERM GOAL #2   Title Improved hip flexion bil 5/5 MMT    Time 6    Period Weeks    Status New      PT LONG TERM GOAL #3   Title Patient will be able to perform repeated lifting for home yard work without limitation.    Time 6    Period Weeks                 Plan - 12/10/20 1018    Clinical  Impression Statement Patient presented in clinic with denial of any hip or LBP. Patient able to tolerate all therex well with no complaints of pain. Patient did require intermittant cueing to improve technique overall. Patient to return to MD regarding LBP, hip pain on 12/17/2020. Patient had no complaints following end of treatment.    Personal Factors and Comorbidities Age;Other    Examination-Activity Limitations Other    Examination-Participation Restrictions Yard Work    Stability/Clinical Decision Making Stable/Uncomplicated    Rehab Potential Excellent    PT Frequency 1x / week    PT Duration 6 weeks    Consulted and Agree with Plan of Care Patient           Patient will benefit from skilled therapeutic intervention in order to improve the following deficits and impairments:  Decreased balance,Decreased mobility,Difficulty walking,Hypomobility,Decreased strength  Visit Diagnosis: Pain in right hip  DDD (degenerative disc disease), lumbar  Muscle weakness (generalized)     Problem List Patient Active Problem List   Diagnosis Date Noted  . CAD (coronary artery disease) 07/16/2020  . Hyperlipidemia 03/19/2020  . Unstable/progressive angina 10/20/2019  . Sick sinus syndrome (HCC) 08/22/2015  . Obstructive sleep apnea 08/20/2014  . Chest pain, mid sternal 06/01/2013  . Dizziness 04/05/2013  . DOE (dyspnea on exertion) 04/03/2013  . Near syncope 04/03/2013    Marvell Fuller, PTA 12/10/2020, 10:28 AM  [facility] [address] [place], [place], [address] Phone: [phone]   Fax:  [phone]  Name: Colton Bennett MRN: [id number] Date of Birth: 1933-11-01

## 2020-12-11 LAB — CUP PACEART REMOTE DEVICE CHECK
Battery Remaining Longevity: 78 mo
Battery Voltage: 3 V
Brady Statistic AP VP Percent: 0.01 %
Brady Statistic AP VS Percent: 3.5 %
Brady Statistic AS VP Percent: 0.04 %
Brady Statistic AS VS Percent: 96.45 %
Brady Statistic RA Percent Paced: 3.5 %
Brady Statistic RV Percent Paced: 0.05 %
Date Time Interrogation Session: 20220531075632
Implantable Lead Implant Date: 20170210
Implantable Lead Implant Date: 20170210
Implantable Lead Location: 753859
Implantable Lead Location: 753860
Implantable Lead Model: 5076
Implantable Lead Model: 5076
Implantable Pulse Generator Implant Date: 20170210
Lead Channel Impedance Value: 304 Ohm
Lead Channel Impedance Value: 342 Ohm
Lead Channel Impedance Value: 418 Ohm
Lead Channel Impedance Value: 437 Ohm
Lead Channel Pacing Threshold Amplitude: 0.625 V
Lead Channel Pacing Threshold Amplitude: 0.875 V
Lead Channel Pacing Threshold Pulse Width: 0.4 ms
Lead Channel Pacing Threshold Pulse Width: 0.4 ms
Lead Channel Sensing Intrinsic Amplitude: 1.625 mV
Lead Channel Sensing Intrinsic Amplitude: 1.625 mV
Lead Channel Sensing Intrinsic Amplitude: 15.25 mV
Lead Channel Sensing Intrinsic Amplitude: 15.25 mV
Lead Channel Setting Pacing Amplitude: 2 V
Lead Channel Setting Pacing Amplitude: 2.5 V
Lead Channel Setting Pacing Pulse Width: 0.4 ms
Lead Channel Setting Sensing Sensitivity: 2.8 mV

## 2020-12-16 ENCOUNTER — Ambulatory Visit (INDEPENDENT_AMBULATORY_CARE_PROVIDER_SITE_OTHER): Payer: Medicare Other | Admitting: Adult Health

## 2020-12-16 ENCOUNTER — Encounter: Payer: Self-pay | Admitting: Adult Health

## 2020-12-16 VITALS — BP 112/50 | HR 57 | Ht 72.0 in | Wt 199.0 lb

## 2020-12-16 DIAGNOSIS — G4733 Obstructive sleep apnea (adult) (pediatric): Secondary | ICD-10-CM | POA: Diagnosis not present

## 2020-12-16 DIAGNOSIS — I2510 Atherosclerotic heart disease of native coronary artery without angina pectoris: Secondary | ICD-10-CM

## 2020-12-16 DIAGNOSIS — Z9989 Dependence on other enabling machines and devices: Secondary | ICD-10-CM

## 2020-12-16 NOTE — Progress Notes (Addendum)
PATIENT: Colton Bennett DOB: 1934/03/03  REASON FOR VISIT: follow up HISTORY FROM: patient Primary neurologist: Dr. Frances Furbish  HISTORY OF PRESENT ILLNESS: Today 12/16/20:  Colton Bennett is an 87 year old male with a history of obstructive sleep apnea on CPAP.  He reports that the CPAP is working well for him.  He denies any new issues.  He returns today for an evaluation.    12/14/19: Colton Bennett is a 87 year old male with a history of obstructive sleep apnea on CPAP. He returns today for follow-up. His download indicates that he uses machine nightly for compliance of 100%. He uses machine greater than 4 hours each night. On average he uses his machine 7 hours and 17 minutes. His residual AHI is 2.8 on 8 cm of water with an EPR of one. Reports CPAP works well.   HISTORY 12/07/2018: I reviewed his CPAP compliance data from 11/07/2018 through 12/06/2018 which is a total of 30 days, during which time he used his machine every night with percent used days greater than 4 hours at 100%, indicating superb compliance with an average usage of 7 hours and 29 minutes, residual AHI at goal at 3.9/h, leak acceptable with a 95th percentile at 9.1 L/min on a pressure set at 8 cm without EPR.  His set up date was 09/20/2018.  REVIEW OF SYSTEMS: Out of a complete 14 system review of symptoms, the patient complains only of the following symptoms, and all other reviewed systems are negative.  FSS 22 ESS 3  ALLERGIES: Allergies  Allergen Reactions   Statins     HOME MEDICATIONS: Outpatient Medications Prior to Visit  Medication Sig Dispense Refill   alfuzosin (UROXATRAL) 10 MG 24 hr tablet Take 10 mg by mouth daily before breakfast.      Ascorbic Acid (VITAMIN C) 1000 MG tablet Take 1,000 mg by mouth daily.     aspirin EC 81 MG tablet Take 1 tablet (81 mg total) by mouth daily.     Cyanocobalamin (B-12) 5000 MCG CAPS Take 5,000 mcg by mouth daily.     ezetimibe (ZETIA) 10 MG tablet Take 1 tablet (10 mg total) by  mouth daily. 90 tablet 2   metoprolol succinate (TOPROL XL) 25 MG 24 hr tablet Take 1 tablet (25 mg total) by mouth daily. 90 tablet 3   nitroGLYCERIN (NITROSTAT) 0.4 MG SL tablet Place 1 tablet (0.4 mg total) under the tongue every 5 (five) minutes as needed for chest pain. 25 tablet 6   omeprazole (PRILOSEC) 40 MG capsule Take 40 mg by mouth daily.     No facility-administered medications prior to visit.    PAST MEDICAL HISTORY: Past Medical History:  Diagnosis Date   Chest pain    Dizziness    DOE (dyspnea on exertion)    GERD (gastroesophageal reflux disease)    History of cardiovascular stress test    ETT-Myoview (06/07/13): No ischemia, EF 56%; normal study.   History of echocardiogram    Echocardiogram (01/2013): EF of 50-55%, normal wall motion, trivial AI, mild MR, mild to moderate LAE   OSA on CPAP    Presence of permanent cardiac pacemaker    SSS (sick sinus syndrome) (HCC)    Syncope, near     PAST SURGICAL HISTORY: Past Surgical History:  Procedure Laterality Date   EP IMPLANTABLE DEVICE N/A 08/23/2015   MDT Advisa DR MRI compatible pacemaker implanted by Dr Johney Frame for sick sinus and syncope   EP IMPLANTABLE DEVICE N/A 08/23/2015  Procedure: Loop Recorder Removal;  Surgeon: Hillis Range, MD;  Location: [facility] INVASIVE CV LAB;  Service: Cardiovascular;  Laterality: N/A;   LEFT HEART CATH AND CORONARY ANGIOGRAPHY N/A 10/25/2019   Procedure: LEFT HEART CATH AND CORONARY ANGIOGRAPHY;  Surgeon: Marykay Lex, MD;  Location: [facility] INVASIVE CV LAB;  Service: Cardiovascular;  Laterality: N/A;   LOOP RECORDER IMPLANT  September 2014   LOOP RECORDER IMPLANT N/A 04/05/2013   Procedure: LOOP RECORDER IMPLANT;  Surgeon: Gardiner Rhyme, MD;  Location: [facility] CATH LAB;  Service: Cardiovascular;  Laterality: N/A;    FAMILY HISTORY: Family History  Problem Relation Age of Onset   Other Father        Died at age 45  of unknown cause   Lung cancer Brother    Dementia Brother     Suicidality Brother        died of suicide   Lymphoma Brother    CAD Neg Hx     SOCIAL HISTORY: Social History   Socioeconomic History   Marital status: Married    Spouse name: Not on file   Number of children: 1   Years of education: Not on file   Highest education level: Not on file  Occupational History   Occupation: 'retired  Tobacco Use   Smoking status: Former Smoker    Packs/day: 3.00    Years: 30.00    Pack years: 90.00    Types: Cigarettes    Quit date: 02/01/1982    Years since quitting: 38.8   Smokeless tobacco: Never Used  Vaping Use   Vaping Use: Never used  Substance and Sexual Activity   Alcohol use: No    Alcohol/week: 0.0 standard drinks   Drug use: No   Sexual activity: Not on file  Other Topics Concern   Not on file  Social History Narrative   Not on file   Social Determinants of Health   Financial Resource Strain: Not on file  Food Insecurity: Not on file  Transportation Needs: Not on file  Physical Activity: Not on file  Stress: Not on file  Social Connections: Not on file  Intimate Partner Violence: Not on file      PHYSICAL EXAM  Vitals:   12/16/20 1345  BP: (!) 112/50  Pulse: (!) 57  Weight: 199 lb (90.3 kg)  Height: 6' (1.829 m)   Body mass index is 26.99 kg/m.  Generalized: Well developed, in no acute distress  Chest: Lungs clear to auscultation bilaterally  Neurological examination  Mentation: Alert oriented to time, place, history taking. Follows all commands speech and language fluent Cranial nerve II-XII: Extraocular movements were full, visual field were full on confrontational test Head turning and shoulder shrug  were normal and symmetric. Motor: The motor testing reveals 5 over 5 strength of all 4 extremities. Good symmetric motor tone is noted throughout.  Sensory: Sensory testing is intact to soft touch on all 4 extremities. No evidence of extinction is noted.  Gait and station: Gait is normal.    DIAGNOSTIC  DATA (LABS, IMAGING, TESTING) - I reviewed patient records, labs, notes, testing and imaging myself where available.  Lab Results  Component Value Date   WBC 7.6 10/20/2019   HGB 12.9 (L) 10/20/2019   HCT 38.4 10/20/2019   MCV 92 10/20/2019   PLT 202 10/20/2019      Component Value Date/Time   NA 141 03/15/2020 1006   K 4.6 03/15/2020 1006   CL 104 03/15/2020 1006  CO2 26 03/15/2020 1006   GLUCOSE 94 03/15/2020 1006   GLUCOSE 108 (H) 08/24/2015 0301   BUN 14 03/15/2020 1006   CREATININE 1.09 03/15/2020 1006   CALCIUM 9.4 03/15/2020 1006   PROT 6.5 06/20/2020 0901   ALBUMIN 3.8 06/20/2020 0901   AST 9 06/20/2020 0901   ALT 9 06/20/2020 0901   ALKPHOS 84 06/20/2020 0901   BILITOT 1.2 06/20/2020 0901   GFRNONAA 62 03/15/2020 1006   GFRAA 72 03/15/2020 1006   Lab Results  Component Value Date   CHOL 98 (L) 06/20/2020   HDL 34 (L) 06/20/2020   LDLCALC 51 06/20/2020   LDLDIRECT 86 03/19/2020   TRIG 53 06/20/2020   CHOLHDL 2.9 06/20/2020   Lab Results  Component Value Date   HGBA1C 5.8 (H) 02/01/2013   No results found for: [id number] Lab Results  Component Value Date   TSH 3.440 02/01/2013      ASSESSMENT AND PLAN 87 y.o. year old male  has a past medical history of Chest pain, Dizziness, DOE (dyspnea on exertion), GERD (gastroesophageal reflux disease), History of cardiovascular stress test, History of echocardiogram, OSA on CPAP, Presence of permanent cardiac pacemaker, SSS (sick sinus syndrome) (HCC), and Syncope, near. here with:  OSA on CPAP  - CPAP compliance excellent - Good treatment of AHI  - Encourage patient to use CPAP nightly and > 4 hours each night - F/U in 1 year or sooner if needed     Butch Penny, MSN, NP-C 12/16/2020, 2:17 PM [facility] [address], [address] [place], [place] [address] [phone]  I reviewed the above note and documentation by the Nurse Practitioner and agree with the history, exam, assessment  and plan as outlined above. I was available for consultation. Huston Foley, MD, PhD [facility])

## 2020-12-16 NOTE — Patient Instructions (Signed)
Continue using CPAP nightly and greater than 4 hours each night °If your symptoms worsen or you develop new symptoms please let us know.  ° °

## 2020-12-17 ENCOUNTER — Encounter: Payer: Self-pay | Admitting: Physical Therapy

## 2020-12-17 ENCOUNTER — Other Ambulatory Visit: Payer: Self-pay

## 2020-12-17 ENCOUNTER — Ambulatory Visit: Payer: Medicare Other | Attending: Family Medicine | Admitting: Physical Therapy

## 2020-12-17 DIAGNOSIS — M6281 Muscle weakness (generalized): Secondary | ICD-10-CM

## 2020-12-17 DIAGNOSIS — M25551 Pain in right hip: Secondary | ICD-10-CM | POA: Diagnosis present

## 2020-12-17 DIAGNOSIS — M5136 Other intervertebral disc degeneration, lumbar region: Secondary | ICD-10-CM | POA: Diagnosis present

## 2020-12-17 NOTE — Therapy (Signed)
[facility] [address] [place], [place], [address] Phone: [phone]   Fax:  [phone]  Physical Therapy Treatment  Patient Details  Name: Colton Bennett MRN: [id number] Date of Birth: Oct 30, 1933 Referring Provider (PT): Kip Corrington   Encounter Date: 12/17/2020   PT End of Session - 12/17/20 0904    Visit Number 3    Number of Visits 6    Date for PT Re-Evaluation 01/14/21    PT Start Time 0902    PT Stop Time 0944    PT Time Calculation (min) 42 min    Activity Tolerance Patient tolerated treatment well    Behavior During Therapy [facility] for tasks assessed/performed           Past Medical History:  Diagnosis Date  . Chest pain   . Dizziness   . DOE (dyspnea on exertion)   . GERD (gastroesophageal reflux disease)   . History of cardiovascular stress test    ETT-Myoview (06/07/13): No ischemia, EF 56%; normal study.  . History of echocardiogram    Echocardiogram (01/2013): EF of 50-55%, normal wall motion, trivial AI, mild MR, mild to moderate LAE  . OSA on CPAP   . Presence of permanent cardiac pacemaker   . SSS (sick sinus syndrome) (HCC)   . Syncope, near     Past Surgical History:  Procedure Laterality Date  . EP IMPLANTABLE DEVICE N/A 08/23/2015   MDT Advisa DR MRI compatible pacemaker implanted by Dr Johney Frame for sick sinus and syncope  . EP IMPLANTABLE DEVICE N/A 08/23/2015   Procedure: Loop Recorder Removal;  Surgeon: Hillis Range, MD;  Location: [facility] INVASIVE CV LAB;  Service: Cardiovascular;  Laterality: N/A;  . LEFT HEART CATH AND CORONARY ANGIOGRAPHY N/A 10/25/2019   Procedure: LEFT HEART CATH AND CORONARY ANGIOGRAPHY;  Surgeon: Marykay Lex, MD;  Location: [facility] INVASIVE CV LAB;  Service: Cardiovascular;  Laterality: N/A;  . LOOP RECORDER IMPLANT  September 2014  . LOOP RECORDER IMPLANT N/A 04/05/2013   Procedure: LOOP RECORDER IMPLANT;  Surgeon: Gardiner Rhyme, MD;  Location: [facility] CATH LAB;  Service: Cardiovascular;   Laterality: N/A;    There were no vitals filed for this visit.   Subjective Assessment - 12/17/20 0900    Subjective Patient denied any LBP or hip pain upon arrival. Goes to see rheumatologist this afternoon and sees orthopedic next week.    Pertinent History Pacemaker    Limitations Walking;Other (comment)    Currently in Pain? No/denies              [facility] PT Assessment - 12/17/20 0001      Assessment   Medical Diagnosis Right Hip Painl; Spondylosis of Lumbar Region    Referring Provider (PT) Kip Corrington    Onset Date/Surgical Date 09/05/20    Next MD Visit 12/17/2020      Precautions   Precautions ICD/Pacemaker                         [facility] Adult PT Treatment/Exercise - 12/17/20 0001      Lumbar Exercises: Aerobic   Nustep L3, seat 12 x16 min      Lumbar Exercises: Machines for Strengthening   Cybex Lumbar Extension 50# 3x10 reps    Cybex Knee Extension 10# 3x10 reps    Cybex Knee Flexion 30# 3x10 reps    Leg Press 1.5 pl, seat 8 x30 reps      Knee/Hip Exercises: Standing  Walking with Sports Cord 4D XTS walking blue x10 reps each                       PT Long Term Goals - 12/17/20 0945      PT LONG TERM GOAL #1   Title Independent with HEP    Time 6    Period Weeks    Status Achieved      PT LONG TERM GOAL #2   Title Improved hip flexion bil 5/5 MMT    Time 6    Period Weeks    Status On-going      PT LONG TERM GOAL #3   Title Patient will be able to perform repeated lifting for home yard work without limitation.    Time 6    Period Weeks    Status On-going                 Plan - 12/17/20 0945    Clinical Impression Statement Patient presented in clinic with no functional limitations or LBP. Patient able to complete all machine strengthening without complaint of pain. Patient also able to tolerate resisted walking with VCs for controlled motions to avoid LOB.    Personal Factors and Comorbidities Age;Other     Examination-Activity Limitations Other    Examination-Participation Restrictions Yard Work    Stability/Clinical Decision Making Stable/Uncomplicated    Rehab Potential Excellent    PT Frequency 1x / week    PT Duration 6 weeks    PT Treatment/Interventions Therapeutic activities;Therapeutic exercise;Neuromuscular re-education;Patient/family education;Manual techniques    Consulted and Agree with Plan of Care Patient           Patient will benefit from skilled therapeutic intervention in order to improve the following deficits and impairments:  Decreased balance,Decreased mobility,Difficulty walking,Hypomobility,Decreased strength  Visit Diagnosis: Pain in right hip  DDD (degenerative disc disease), lumbar  Muscle weakness (generalized)     Problem List Patient Active Problem List   Diagnosis Date Noted  . CAD (coronary artery disease) 07/16/2020  . Hyperlipidemia 03/19/2020  . Unstable/progressive angina 10/20/2019  . Sick sinus syndrome (HCC) 08/22/2015  . Obstructive sleep apnea 08/20/2014  . Chest pain, mid sternal 06/01/2013  . Dizziness 04/05/2013  . DOE (dyspnea on exertion) 04/03/2013  . Near syncope 04/03/2013    Marvell Fuller, PTA 12/17/2020, 10:23 AM  [facility] [address] [place], [place], [address] Phone: [phone]   Fax:  [phone]  Name: Colton Bennett MRN: [id number] Date of Birth: 06-08-1934

## 2020-12-24 ENCOUNTER — Ambulatory Visit: Payer: Medicare Other

## 2020-12-25 ENCOUNTER — Ambulatory Visit: Payer: Medicare Other

## 2020-12-31 ENCOUNTER — Ambulatory Visit: Payer: Medicare Other

## 2020-12-31 ENCOUNTER — Other Ambulatory Visit: Payer: Self-pay

## 2020-12-31 DIAGNOSIS — M25551 Pain in right hip: Secondary | ICD-10-CM | POA: Diagnosis not present

## 2020-12-31 DIAGNOSIS — M6281 Muscle weakness (generalized): Secondary | ICD-10-CM

## 2020-12-31 DIAGNOSIS — M5136 Other intervertebral disc degeneration, lumbar region: Secondary | ICD-10-CM

## 2020-12-31 NOTE — Therapy (Signed)
[facility] [address] [place], [place], [address] Phone: [phone]   Fax:  [phone]  Physical Therapy Treatment  Patient Details  Name: Colton Bennett MRN: [id number] Date of Birth: 1933-08-22 Referring Provider (PT): Kip Corrington   Encounter Date: 12/31/2020   PT End of Session - 12/31/20 1134     Visit Number 4    Number of Visits 6    Date for PT Re-Evaluation 01/14/21    PT Start Time 0900    PT Stop Time 0946    PT Time Calculation (min) 46 min    Activity Tolerance Patient tolerated treatment well    Behavior During Therapy [facility] for tasks assessed/performed             Past Medical History:  Diagnosis Date   Chest pain    Dizziness    DOE (dyspnea on exertion)    GERD (gastroesophageal reflux disease)    History of cardiovascular stress test    ETT-Myoview (06/07/13): No ischemia, EF 56%; normal study.   History of echocardiogram    Echocardiogram (01/2013): EF of 50-55%, normal wall motion, trivial AI, mild MR, mild to moderate LAE   OSA on CPAP    Presence of permanent cardiac pacemaker    SSS (sick sinus syndrome) (HCC)    Syncope, near     Past Surgical History:  Procedure Laterality Date   EP IMPLANTABLE DEVICE N/A 08/23/2015   MDT Advisa DR MRI compatible pacemaker implanted by Dr Johney Frame for sick sinus and syncope   EP IMPLANTABLE DEVICE N/A 08/23/2015   Procedure: Loop Recorder Removal;  Surgeon: Hillis Range, MD;  Location: [facility] INVASIVE CV LAB;  Service: Cardiovascular;  Laterality: N/A;   LEFT HEART CATH AND CORONARY ANGIOGRAPHY N/A 10/25/2019   Procedure: LEFT HEART CATH AND CORONARY ANGIOGRAPHY;  Surgeon: Marykay Lex, MD;  Location: [facility] INVASIVE CV LAB;  Service: Cardiovascular;  Laterality: N/A;   LOOP RECORDER IMPLANT  September 2014   LOOP RECORDER IMPLANT N/A 04/05/2013   Procedure: LOOP RECORDER IMPLANT;  Surgeon: Gardiner Rhyme, MD;  Location: [facility] CATH LAB;  Service: Cardiovascular;   Laterality: N/A;    There were no vitals filed for this visit.   Subjective Assessment - 12/31/20 0931     Subjective COVID-19 screening questionnaire provided upon entering the clinic. Patient denied LBP today and states that his hp only just started aching in the past 3 days - he is worried that the injection may be wearing off. He had an MRI and is waiting for a follow up appointment to discuss results.    Pertinent History Pacemaker    Limitations Walking;Other (comment)    Currently in Pain? No/denies                               [facility] Adult PT Treatment/Exercise - 12/31/20 0900       Exercises   Exercises Lumbar;Knee/Hip      Lumbar Exercises: Stretches   Active Hamstring Stretch Right;Left;3 reps;20 seconds    Hip Flexor Stretch 3 reps;10 seconds      Lumbar Exercises: Aerobic   Nustep L3, seat 10 x16 min      Lumbar Exercises: Standing   Other Standing Lumbar Exercises side stepping with red tband; fwd and retro stepping with red tband      Lumbar Exercises: Seated   Sit to Stand 15 reps;Limitations    Sit to Stand  Limitations with red tband at the knees; and 10# weight in hands      Lumbar Exercises: Supine   Straight Leg Raise 20 reps    Straight Leg Raises Limitations sidelying    Other Supine Lumbar Exercises Hip bridge 2 x 10; SLR 2 x 10; sidelying SLR - with tactile cues to maintain pelvic neutral 2 x10      Lumbar Exercises: Quadruped   Straight Leg Raise 10 reps      Knee/Hip Exercises: Aerobic   Nustep 15 mins      Knee/Hip Exercises: Standing   Hip Abduction Stengthening;Both;2 sets;10 reps    Hip Extension Stengthening;Both;2 sets;10 reps      Manual Therapy   Manual Therapy Soft tissue mobilization;Myofascial release;Passive ROM    Manual therapy comments improved IR, ER of the R hip in sidelying,    Soft tissue mobilization IASTM (lacrosse ball) at lateral hip and gluteal region    Passive ROM hip IR/ER, flexion                          PT Long Term Goals - 12/17/20 0945       PT LONG TERM GOAL #1   Title Independent with HEP    Time 6    Period Weeks    Status Achieved      PT LONG TERM GOAL #2   Title Improved hip flexion bil 5/5 MMT    Time 6    Period Weeks    Status On-going      PT LONG TERM GOAL #3   Title Patient will be able to perform repeated lifting for home yard work without limitation.    Time 6    Period Weeks    Status On-going                   Plan - 12/31/20 1134     Clinical Impression Statement Patient participated well in PT with great response to lateral and posterior hip strengthening. He was able t otoerate quadruped exercises well however fatigued moderately and required VCs to avoid trunk rotation. Manual therapy provided relief at the R lateral hip musculature. Patient to continu with variable LE strengthening to offload the lumbar spine and reduce impairments associated with DDD, spondylosis, and ankylosis of the SI joint.    Personal Factors and Comorbidities Age;Other    Examination-Activity Limitations Other    Examination-Participation Restrictions Yard Work    Stability/Clinical Decision Making Stable/Uncomplicated    Clinical Decision Making Low    Rehab Potential Excellent    PT Frequency 1x / week    PT Duration 6 weeks    PT Treatment/Interventions Therapeutic activities;Therapeutic exercise;Neuromuscular re-education;Patient/family education;Manual techniques             Patient will benefit from skilled therapeutic intervention in order to improve the following deficits and impairments:  Decreased balance, Decreased mobility, Difficulty walking, Hypomobility, Decreased strength  Visit Diagnosis: Pain in right hip  DDD (degenerative disc disease), lumbar  Muscle weakness (generalized)     Problem List Patient Active Problem List   Diagnosis Date Noted   CAD (coronary artery disease) 07/16/2020   Hyperlipidemia  03/19/2020   Unstable/progressive angina 10/20/2019   Sick sinus syndrome (HCC) 08/22/2015   Obstructive sleep apnea 08/20/2014   Chest pain, mid sternal 06/01/2013   Dizziness 04/05/2013   DOE (dyspnea on exertion) 04/03/2013   Near syncope 04/03/2013    Levonne Spiller  PT, DPT 12/31/2020, 11:40 AM  [facility] [address] [place], [place], [address] Phone: [phone]   Fax:  [phone]  Name: GAMAL TODISCO MRN: [id number] Date of Birth: [birth year]

## 2021-01-01 NOTE — Progress Notes (Signed)
Remote pacemaker transmission.   

## 2021-01-07 ENCOUNTER — Ambulatory Visit: Payer: Medicare Other

## 2021-02-04 ENCOUNTER — Other Ambulatory Visit: Payer: Self-pay | Admitting: Cardiovascular Disease

## 2021-02-04 DIAGNOSIS — E785 Hyperlipidemia, unspecified: Secondary | ICD-10-CM

## 2021-02-10 ENCOUNTER — Ambulatory Visit (INDEPENDENT_AMBULATORY_CARE_PROVIDER_SITE_OTHER): Payer: Medicare Other | Admitting: Internal Medicine

## 2021-02-10 ENCOUNTER — Other Ambulatory Visit: Payer: Self-pay

## 2021-02-10 ENCOUNTER — Encounter: Payer: Self-pay | Admitting: Internal Medicine

## 2021-02-10 VITALS — BP 112/60 | HR 71 | Wt 196.2 lb

## 2021-02-10 DIAGNOSIS — E785 Hyperlipidemia, unspecified: Secondary | ICD-10-CM

## 2021-02-10 DIAGNOSIS — I2510 Atherosclerotic heart disease of native coronary artery without angina pectoris: Secondary | ICD-10-CM | POA: Diagnosis not present

## 2021-02-10 DIAGNOSIS — I495 Sick sinus syndrome: Secondary | ICD-10-CM | POA: Diagnosis not present

## 2021-02-10 DIAGNOSIS — G4733 Obstructive sleep apnea (adult) (pediatric): Secondary | ICD-10-CM | POA: Diagnosis not present

## 2021-02-10 LAB — CUP PACEART INCLINIC DEVICE CHECK
Battery Remaining Longevity: 68 mo
Battery Voltage: 3 V
Brady Statistic AP VP Percent: 0.02 %
Brady Statistic AP VS Percent: 7.06 %
Brady Statistic AS VP Percent: 0.04 %
Brady Statistic AS VS Percent: 92.87 %
Brady Statistic RA Percent Paced: 7.06 %
Brady Statistic RV Percent Paced: 0.07 %
Date Time Interrogation Session: 20220801144436
Implantable Lead Implant Date: 20170210
Implantable Lead Implant Date: 20170210
Implantable Lead Location: 753859
Implantable Lead Location: 753860
Implantable Lead Model: 5076
Implantable Lead Model: 5076
Implantable Pulse Generator Implant Date: 20170210
Lead Channel Impedance Value: 361 Ohm
Lead Channel Impedance Value: 399 Ohm
Lead Channel Impedance Value: 456 Ohm
Lead Channel Impedance Value: 475 Ohm
Lead Channel Pacing Threshold Amplitude: 1 V
Lead Channel Pacing Threshold Amplitude: 1 V
Lead Channel Pacing Threshold Pulse Width: 0.4 ms
Lead Channel Pacing Threshold Pulse Width: 0.4 ms
Lead Channel Sensing Intrinsic Amplitude: 2.375 mV
Lead Channel Sensing Intrinsic Amplitude: 21.625 mV
Lead Channel Setting Pacing Amplitude: 2 V
Lead Channel Setting Pacing Amplitude: 2.5 V
Lead Channel Setting Pacing Pulse Width: 0.4 ms
Lead Channel Setting Sensing Sensitivity: 2.8 mV

## 2021-02-10 NOTE — Patient Instructions (Addendum)
Medication Instructions:  Your physician recommends that you continue on your current medications as directed. Please refer to the Current Medication list given to you today.  Labwork: None ordered.  Testing/Procedures: None ordered.  Follow-Up: Your physician wants you to follow-up in: 12 months with Hillis Range, MD or one of the following Advanced Practice Providers on your designated Care Team:    Casimiro Needle "Mardelle Matte" [place], [place]   You will receive a reminder letter in the mail two months in advance. If you don't receive a letter, please call our office to schedule the follow-up appointment.  Remote monitoring is used to monitor your Pacemaker from home. This monitoring reduces the number of office visits required to check your device to one time per year. It allows [place] to keep an eye on the functioning of your device to ensure it is working properly. You are scheduled for a device check from home on 03/11/21. You may send your transmission at any time that day. If you have a wireless device, the transmission will be sent automatically. After your physician reviews your transmission, you will receive a postcard with your next transmission date.  Any Other Special Instructions Will Be Listed Below (If Applicable).  If you need a refill on your cardiac medications before your next appointment, please call your pharmacy.

## 2021-02-10 NOTE — Progress Notes (Signed)
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 male who presents today for routine electrophysiology followup.  Since last being seen in our clinic, the patient reports doing very well.  Today, he denies symptoms of palpitations, chest pain, shortness of breath,  lower extremity edema, dizziness, presyncope, or syncope.  The patient is otherwise without complaint today.   Past Medical History:  Diagnosis Date   Chest pain    Dizziness    DOE (dyspnea on exertion)    GERD (gastroesophageal reflux disease)    History of cardiovascular stress test    ETT-Myoview (06/07/13): No ischemia, EF 56%; normal study.   History of echocardiogram    Echocardiogram (01/2013): EF of 50-55%, normal wall motion, trivial AI, mild MR, mild to moderate LAE   OSA on CPAP    Presence of permanent cardiac pacemaker    SSS (sick sinus syndrome) (HCC)    Syncope, near    Past Surgical History:  Procedure Laterality Date   EP IMPLANTABLE DEVICE N/A 08/23/2015   MDT Advisa DR MRI compatible pacemaker implanted by Dr Johney Frame for sick sinus and syncope   EP IMPLANTABLE DEVICE N/A 08/23/2015   Procedure: Loop Recorder Removal;  Surgeon: Hillis Range, MD;  Location: [facility] INVASIVE CV LAB;  Service: Cardiovascular;  Laterality: N/A;   LEFT HEART CATH AND CORONARY ANGIOGRAPHY N/A 10/25/2019   Procedure: LEFT HEART CATH AND CORONARY ANGIOGRAPHY;  Surgeon: Marykay Lex, MD;  Location: [facility] INVASIVE CV LAB;  Service: Cardiovascular;  Laterality: N/A;   LOOP RECORDER IMPLANT  September 2014   LOOP RECORDER IMPLANT N/A 04/05/2013   Procedure: LOOP RECORDER IMPLANT;  Surgeon: Gardiner Rhyme, MD;  Location: [facility] CATH LAB;  Service: Cardiovascular;  Laterality: N/A;    ROS- all systems are reviewed and negative except as per HPI above  Current Outpatient Medications  Medication Sig Dispense Refill   alfuzosin (UROXATRAL) 10 MG 24 hr tablet Take 10 mg by mouth  daily before breakfast.      Ascorbic Acid (VITAMIN C) 1000 MG tablet Take 1,000 mg by mouth daily.     aspirin EC 81 MG tablet Take 1 tablet (81 mg total) by mouth daily.     Cyanocobalamin (B-12) 5000 MCG CAPS Take 5,000 mcg by mouth daily.     ezetimibe (ZETIA) 10 MG tablet TAKE 1 TABLET DAILY 90 tablet 3   nitroGLYCERIN (NITROSTAT) 0.4 MG SL tablet Place 1 tablet (0.4 mg total) under the tongue every 5 (five) minutes as needed for chest pain. 25 tablet 6   omeprazole (PRILOSEC) 40 MG capsule Take 40 mg by mouth daily.     No current facility-administered medications for this visit.    Physical Exam: Vitals:   02/10/21 1439  BP: 112/60  Pulse: 71  SpO2: 95%  Weight: 196 lb 3.2 oz (89 kg)    GEN- The patient is well appearing, alert and oriented x 3 today.   Head- normocephalic, atraumatic Eyes-  Sclera clear, conjunctiva pink Ears- hearing intact Oropharynx- clear Lungs- Clear to ausculation bilaterally, normal work of breathing Chest- pacemaker pocket is well healed Heart- Regular rate and rhythm, no murmurs, rubs or gallops, PMI not laterally displaced GI- soft, NT, ND, + BS Extremities- no clubbing, cyanosis, or edema  Pacemaker interrogation- reviewed in detail today,  See PACEART report  ekg tracing ordered today is personally reviewed and shows sinus  Assessment and Plan:  1. Symptomatic sinus bradycardia  Normal pacemaker function  See Pace Art report No changes today he is not device dependant today  2. OSA Compliance with CPAP advised  3. 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  Medical therapy is advised No changes today  4. HL Continue zetia 10mg  daily   Risks, benefits and potential toxicities for medications prescribed and/or refilled reviewed with patient today.   Return to see EP APP annually  MD, [facility] 02/10/2021 2:56 PM

## 2021-03-11 ENCOUNTER — Ambulatory Visit (INDEPENDENT_AMBULATORY_CARE_PROVIDER_SITE_OTHER): Payer: Medicare Other

## 2021-03-11 DIAGNOSIS — I495 Sick sinus syndrome: Secondary | ICD-10-CM | POA: Diagnosis not present

## 2021-03-11 LAB — CUP PACEART REMOTE DEVICE CHECK
Battery Remaining Longevity: 69 mo
Battery Voltage: 3 V
Brady Statistic AP VP Percent: 0.03 %
Brady Statistic AP VS Percent: 10.99 %
Brady Statistic AS VP Percent: 0.04 %
Brady Statistic AS VS Percent: 88.94 %
Brady Statistic RA Percent Paced: 10.97 %
Brady Statistic RV Percent Paced: 0.08 %
Date Time Interrogation Session: 20220830085240
Implantable Lead Implant Date: 20170210
Implantable Lead Implant Date: 20170210
Implantable Lead Location: 753859
Implantable Lead Location: 753860
Implantable Lead Model: 5076
Implantable Lead Model: 5076
Implantable Pulse Generator Implant Date: 20170210
Lead Channel Impedance Value: 304 Ohm
Lead Channel Impedance Value: 342 Ohm
Lead Channel Impedance Value: 418 Ohm
Lead Channel Impedance Value: 437 Ohm
Lead Channel Pacing Threshold Amplitude: 0.625 V
Lead Channel Pacing Threshold Amplitude: 1 V
Lead Channel Pacing Threshold Pulse Width: 0.4 ms
Lead Channel Pacing Threshold Pulse Width: 0.4 ms
Lead Channel Sensing Intrinsic Amplitude: 17 mV
Lead Channel Sensing Intrinsic Amplitude: 17 mV
Lead Channel Sensing Intrinsic Amplitude: 2.5 mV
Lead Channel Sensing Intrinsic Amplitude: 2.5 mV
Lead Channel Setting Pacing Amplitude: 2 V
Lead Channel Setting Pacing Amplitude: 2.5 V
Lead Channel Setting Pacing Pulse Width: 0.4 ms
Lead Channel Setting Sensing Sensitivity: 2.8 mV

## 2021-03-24 NOTE — Progress Notes (Signed)
Remote pacemaker transmission.   

## 2021-06-10 ENCOUNTER — Ambulatory Visit (INDEPENDENT_AMBULATORY_CARE_PROVIDER_SITE_OTHER): Payer: Medicare Other

## 2021-06-10 DIAGNOSIS — I495 Sick sinus syndrome: Secondary | ICD-10-CM | POA: Diagnosis not present

## 2021-06-10 LAB — CUP PACEART REMOTE DEVICE CHECK
Battery Remaining Longevity: 70 mo
Battery Voltage: 3 V
Brady Statistic AP VP Percent: 0.03 %
Brady Statistic AP VS Percent: 7.02 %
Brady Statistic AS VP Percent: 0.04 %
Brady Statistic AS VS Percent: 92.91 %
Brady Statistic RA Percent Paced: 7.01 %
Brady Statistic RV Percent Paced: 0.08 %
Date Time Interrogation Session: 20221129080717
Implantable Lead Implant Date: 20170210
Implantable Lead Implant Date: 20170210
Implantable Lead Location: 753859
Implantable Lead Location: 753860
Implantable Lead Model: 5076
Implantable Lead Model: 5076
Implantable Pulse Generator Implant Date: 20170210
Lead Channel Impedance Value: 304 Ohm
Lead Channel Impedance Value: 342 Ohm
Lead Channel Impedance Value: 418 Ohm
Lead Channel Impedance Value: 437 Ohm
Lead Channel Pacing Threshold Amplitude: 0.5 V
Lead Channel Pacing Threshold Amplitude: 0.875 V
Lead Channel Pacing Threshold Pulse Width: 0.4 ms
Lead Channel Pacing Threshold Pulse Width: 0.4 ms
Lead Channel Sensing Intrinsic Amplitude: 1.5 mV
Lead Channel Sensing Intrinsic Amplitude: 1.5 mV
Lead Channel Sensing Intrinsic Amplitude: 15.5 mV
Lead Channel Sensing Intrinsic Amplitude: 15.5 mV
Lead Channel Setting Pacing Amplitude: 2 V
Lead Channel Setting Pacing Amplitude: 2.5 V
Lead Channel Setting Pacing Pulse Width: 0.4 ms
Lead Channel Setting Sensing Sensitivity: 2.8 mV

## 2021-06-19 NOTE — Progress Notes (Signed)
Remote pacemaker transmission.   

## 2021-08-27 ENCOUNTER — Telehealth: Payer: Self-pay

## 2021-08-27 ENCOUNTER — Encounter: Payer: Self-pay | Admitting: Internal Medicine

## 2021-08-27 NOTE — Telephone Encounter (Signed)
° °  Pre-operative Risk Assessment    Patient Name: Colton Bennett  DOB: 1934-03-22 MRN: [id number]      Request for Surgical Clearance    Procedure:   Bilateral lower lid ectropion repair, bilateral conjuctivoplasty, bilateral punctoplasty  Date of Surgery:  Clearance 09/17/21                                 Surgeon:  Dr. Sabino Dick Surgeon's Group or Practice Name:  [facility]  Phone number:  [phone] Fax number:  [phone]   Type of Clearance Requested:   - Medical  - Pharmacy:  Hold Aspirin     Type of Anesthesia:  MAC   Additional requests/questions:   Request is asking for PPM device clearance. Will fwd to device team as well.   Signed, Angelia Hazell   08/27/2021, 4:50 PM

## 2021-08-27 NOTE — Progress Notes (Signed)
PERIOPERATIVE PRESCRIPTION FOR IMPLANTED CARDIAC DEVICE PROGRAMMING   Patient Information:  Patient: Colton Bennett  MRN: [id number]  Date of Birth: 01/27/1934      Planned Procedure:  Bilateral lower lid ectropion repair, bilateral conjuctivoplasty, bilateral punctoplasty  Surgeon:  Dr. Delynn Flavin Date of Procedure:  09/17/2021    Device Information:   Clinic EP Physician:   Dr. Hillis Range Device Type:  Pacemaker Manufacturer and Phone #:  Medtronic: [phone] Pacemaker Dependent?:  No Date of Last Device Check:  06/10/21        Normal Device Function?:  Yes     Electrophysiologist's Recommendations:   Have magnet available. Provide continuous ECG monitoring when magnet is used or reprogramming is to be performed.  Procedure may interfere with device function.  Magnet should be placed over device during procedure.  Per Device Clinic Standing Orders, [name]  08/27/2021 9:09 PM

## 2021-08-28 NOTE — Telephone Encounter (Signed)
Left VM  Per Dr. Elease Hashimoto: OK to hold ASA 5-7 days for surgery.

## 2021-08-28 NOTE — Telephone Encounter (Signed)
Dr. Elease Hashimoto, He has total occlusion of LCX with collaterals, no PCI (heart cath 10/2019). We are asked to hold ASA for eye lid surgery.   OK to hold ASA?

## 2021-08-28 NOTE — Telephone Encounter (Signed)
Patient was returning call 

## 2021-08-29 NOTE — Telephone Encounter (Signed)
° °  Name: Colton Bennett  DOB: June 27, 1934  MRN: [id number]   Primary Cardiologist: Kristeen Miss, MD  Chart reviewed as part of pre-operative protocol coverage. Patient was contacted 08/29/2021 in reference to pre-operative risk assessment for pending surgery as outlined below.  LATRELLE BAZAR was last seen on 02/10/2021 by Dr. Johney Frame.  Since that day, HERSHELL BRANDL has done well and has been without symptoms of angina or decompensation.  His primary cardiologist has indicated it would be ok for the patient to hold aspirin 5-7 days prior to the procedure.  Resumption per treating team when felt to be safely possible.   Therefore, based on ACC/AHA guidelines, the patient would be at acceptable risk for the planned procedure without further cardiovascular testing.   The patient was advised that if he develops new symptoms prior to surgery to contact our office to arrange for a follow-up visit, and he verbalized understanding.  I will route this recommendation to the requesting party via Epic fax function and remove from pre-op pool. Please call with questions.  Eula Listen, PA-C 08/29/2021, 11:01 AM

## 2021-09-23 ENCOUNTER — Ambulatory Visit (INDEPENDENT_AMBULATORY_CARE_PROVIDER_SITE_OTHER): Payer: Medicare Other

## 2021-09-23 ENCOUNTER — Telehealth: Payer: Self-pay

## 2021-09-23 DIAGNOSIS — I495 Sick sinus syndrome: Secondary | ICD-10-CM | POA: Diagnosis not present

## 2021-09-23 LAB — CUP PACEART REMOTE DEVICE CHECK
Battery Remaining Longevity: 71 mo
Battery Voltage: 3 V
Brady Statistic AP VP Percent: 0.01 %
Brady Statistic AP VS Percent: 2.89 %
Brady Statistic AS VP Percent: 0.04 %
Brady Statistic AS VS Percent: 97.06 %
Brady Statistic RA Percent Paced: 2.89 %
Brady Statistic RV Percent Paced: 0.05 %
Date Time Interrogation Session: 20230314150114
Implantable Lead Implant Date: 20170210
Implantable Lead Implant Date: 20170210
Implantable Lead Location: 753859
Implantable Lead Location: 753860
Implantable Lead Model: 5076
Implantable Lead Model: 5076
Implantable Pulse Generator Implant Date: 20170210
Lead Channel Impedance Value: 304 Ohm
Lead Channel Impedance Value: 342 Ohm
Lead Channel Impedance Value: 399 Ohm
Lead Channel Impedance Value: 418 Ohm
Lead Channel Pacing Threshold Amplitude: 0.625 V
Lead Channel Pacing Threshold Amplitude: 1 V
Lead Channel Pacing Threshold Pulse Width: 0.4 ms
Lead Channel Pacing Threshold Pulse Width: 0.4 ms
Lead Channel Sensing Intrinsic Amplitude: 16.75 mV
Lead Channel Sensing Intrinsic Amplitude: 16.75 mV
Lead Channel Sensing Intrinsic Amplitude: 2.625 mV
Lead Channel Sensing Intrinsic Amplitude: 2.625 mV
Lead Channel Setting Pacing Amplitude: 2 V
Lead Channel Setting Pacing Amplitude: 2.5 V
Lead Channel Setting Pacing Pulse Width: 0.4 ms
Lead Channel Setting Sensing Sensitivity: 2.8 mV

## 2021-09-23 NOTE — Telephone Encounter (Signed)
Patient called in stating he has gotten a letter from [place] that he missed his transmission. I have let patient know he needs to send one and have walked him through on how to do that. Patient monitor is not working and we had to call tech support. Tech support had to call him back because patient couldn't hear him on the phone. I will call patient back to see what happened if we dont receive a transmission  ?

## 2021-09-26 ENCOUNTER — Ambulatory Visit (INDEPENDENT_AMBULATORY_CARE_PROVIDER_SITE_OTHER): Payer: Medicare Other | Admitting: Cardiovascular Disease

## 2021-09-26 ENCOUNTER — Other Ambulatory Visit: Payer: Self-pay

## 2021-09-26 ENCOUNTER — Encounter: Payer: Self-pay | Admitting: Cardiovascular Disease

## 2021-09-26 VITALS — BP 134/60 | HR 68 | Ht 72.0 in | Wt 199.8 lb

## 2021-09-26 DIAGNOSIS — I2510 Atherosclerotic heart disease of native coronary artery without angina pectoris: Secondary | ICD-10-CM | POA: Diagnosis not present

## 2021-09-26 DIAGNOSIS — I495 Sick sinus syndrome: Secondary | ICD-10-CM | POA: Diagnosis not present

## 2021-09-26 NOTE — Progress Notes (Signed)
? ? ?Colton Bennett ?Date of Birth  11/07/1933 ?      ?[facility] ?[address]  [address] ?[place], [place]  [address]   [place], [place]  [address] ?[phone]     [phone]   ?Fax  [phone]    Fax [phone] ? ?Problem List: ?1. Syncope  ?2. Dyspnea ?3.  Sick sinus syndrome -  S/p pacer  ? ? ?Colton Bennett is a 88 yo with a recent episode of collapse.  He was seen with his wife who provided most of the accurate history - he seems to play his symptoms down quite a bit.   ? ?He got out of his truck and collapsed in the parking lot.  He was talking and remained conscious the whole time.   He had severe nausea and vomitting after that.   He remained unsteady and was not able to walk for several hours.  He did not want to go to the doctor or ER.   ? ?He felt better after several days. ? ?He were 30 day event monitor. He was found to have normal sinus rhythm. He had occasional episodes of frequent premature ventricular contractions-occasionally in a bigeminal pattern.  He did not have any symptoms while wearing the monitor. ? ?He had an echocardiogram which revealed normal left ventricle systolic function with an ejection fraction of 50-55%. He had normal wall motion. He had trivial aortic insufficiency, mild mitral regurgitation. The left atrium was mildly to moderately dilated. ? ?His EEG was normal. ? ?He's had 2 different sleep studies. The first study was July 31 which revealed severe sleep apnea during REM sleep. He had a second sleep study the results of which are not known at this time. ? ?His MRI was normal. The head MRA is normal. ? ?September 27, 2013: ? ?Colton Bennett has had an implantable loop recorder placed.   He's had a few episodes of presyncope.  He exercises on occasion. He denies any chest pain or shortness breath. ? ?He was found to have a 3 second pause with his ILR.   ? ?Sept. 16,2015: ? ?Colton Bennett has done well.  No further syncopal episodes.    Still has the implantable loop.    Has been busy working in his garden this summer.  ? ?Sept. 19 , 2016: ?Had an episode of dizziness 2 weeks ago .  ?No CP  ? ?Sept. 7, 2017: ? ?Colton Bennett is seen today for follow up visit ?No further passing out episodes.  ?No CP or dyspnea ? ?Sept. 24, 2018 ? ?Colton Bennett is seen , some DOE (according to wife) but he thinks hes doing fine .  ?No CP , or dizziness.  ?Gets pacer checked remotely  ? ?Sept. 24, 2019: ?Colton Bennett is seen back today for follow-up of his sick sinus syndrome.  He had a pacemaker placed several years ago and feels well. ?Also has a history of obstructive sleep apnea. ? ?No CP or dyspnea  ?Exercised until his foot started hurting . ?No CP or dyspnea.  ? ?August 23, 2019: ?Colton Bennett is seen back today for follow-up of his sick sinus syndrome.  He had a pacemaker placed in his followed up with Dr. Graciela Husbands and  the EP team. ? ?He was recently seen by Dr. Delano Metz for bilateral pneumonia in January.  He lost his sense of tast and smell - has returned now.  Never had a fever .  ?Has  tested negative for COVID  X 2 .  ?Took him a long time to get over his pneumonia  ? ?He was seen by Lizabeth LeydenNina Hammond, NP in November, 2020 for chest pain. ?Lexiscan Myoview study at that time revealed no ischemia and normal ejection fraction. ? ?Is not having any other issues.    ? ?December 21, 2019 ? ?Colton Bennett is seen for follow up of his sick sinus syndrome. ?Has had a pacer placed.  ?No cp or dyspnea. ?Cath in April 2021 shows occlusion of the LCx just after a large marginal .   The distal LCx fills fairly well with collaterals ? ? ?Jan. 4, 2022: ?Colton Bennett is seen for follow up of his SSS and CAD.   Has LCx occlusion with good collateral filling no CP , dyspnea, syncope ?Recent lipids look good on Zetia  ? ?September 26, 2021:   ?Colton Bennett is seen for follow up visit for his SSS and CAD  ?No cp or dyspnea.  ?Gets some exercise ?Has some vague "uncomfortable" feeling but this is not related to exertion  ?  ?Current Outpatient  Medications on File Prior to Visit  ?Medication Sig Dispense Refill  ? alfuzosin (UROXATRAL) 10 MG 24 hr tablet Take 10 mg by mouth daily before breakfast.     ? Ascorbic Acid (VITAMIN C) 1000 MG tablet Take 1,000 mg by mouth daily.    ? aspirin EC 81 MG tablet Take 1 tablet (81 mg total) by mouth daily.    ? Cyanocobalamin (B-12) 5000 MCG CAPS Take 5,000 mcg by mouth daily.    ? ezetimibe (ZETIA) 10 MG tablet TAKE 1 TABLET DAILY 90 tablet 3  ? nitroGLYCERIN (NITROSTAT) 0.4 MG SL tablet Place 1 tablet (0.4 mg total) under the tongue every 5 (five) minutes as needed for chest pain. 25 tablet 6  ? omeprazole (PRILOSEC) 40 MG capsule Take 40 mg by mouth daily.    ? ?No current facility-administered medications on file prior to visit.  ? ? ?Allergies  ?Allergen Reactions  ? Statins   ? ? ?Past Medical History:  ?Diagnosis Date  ? Chest pain   ? Dizziness   ? DOE (dyspnea on exertion)   ? GERD (gastroesophageal reflux disease)   ? History of cardiovascular stress test   ? ETT-Myoview (06/07/13): No ischemia, EF 56%; normal study.  ? History of echocardiogram   ? Echocardiogram (01/2013): EF of 50-55%, normal wall motion, trivial AI, mild MR, mild to moderate LAE  ? OSA on CPAP   ? Presence of permanent cardiac pacemaker   ? SSS (sick sinus syndrome) (HCC)   ? Syncope, near   ? ? ?Past Surgical History:  ?Procedure Laterality Date  ? EP IMPLANTABLE DEVICE N/A 08/23/2015  ? MDT Advisa DR MRI compatible pacemaker implanted by Dr Johney FrameAllred for sick sinus and syncope  ? EP IMPLANTABLE DEVICE N/A 08/23/2015  ? Procedure: Loop Recorder Removal;  Surgeon: Hillis RangeJames Allred, MD;  [facility]: [facility];  Service: Cardiovascular;  Laterality: N/A;  ? LEFT HEART CATH AND CORONARY ANGIOGRAPHY N/A 10/25/2019  ? Procedure: LEFT HEART CATH AND CORONARY ANGIOGRAPHY;  Surgeon: Marykay LexHarding, David W, MD;  [facility];  Service: Cardiovascular;  Laterality: N/A;  ? LOOP RECORDER IMPLANT  September 2014  ? LOOP RECORDER IMPLANT N/A  04/05/2013  ? Procedure: LOOP RECORDER IMPLANT;  Surgeon: Gardiner RhymeJames D Allred, MD;  Location: [facility] CATH [facility];  Service: Cardiovascular;  Laterality: N/A;  ? ? ?Social History  ? ?Tobacco  Use  ?Smoking Status Former  ? Packs/day: 3.00  ? Years: 30.00  ? Pack years: 90.00  ? Types: Cigarettes  ? Quit date: 02/01/1982  ? Years since quitting: 39.6  ?Smokeless Tobacco Never  ? ? ?Social History  ? ?Substance and Sexual Activity  ?Alcohol Use No  ? Alcohol/week: 0.0 standard drinks  ? ? ?Family History  ?Problem Relation Age of Onset  ? Other Father   ?     Died at age 69  of unknown cause  ? Lung cancer Brother   ? Dementia Brother   ? Suicidality Brother   ?     died of suicide  ? Lymphoma Brother   ? CAD Neg Hx   ? ? ?Reviw of Systems:  ?Reviewed in the HPI.  All other systems are negative. ? ? ?Physical Exam: ?Blood pressure 134/60, pulse 68, height 6' (1.829 m), weight 199 lb 12.8 oz (90.6 kg), SpO2 99 %. ? ?GEN:  Well nourished, well developed in no acute distress ?HEENT: Normal ?NECK: No JVD; No carotid bruits ?LYMPHATICS: No lymphadenopathy ?CARDIAC: RRR , no murmurs, rubs, gallops ?RESPIRATORY:  Clear to auscultation without rales, wheezing or rhonchi  ?ABDOMEN: Soft, non-tender, non-distended ?MUSCULOSKELETAL:  No edema; No deformity  ?SKIN: Warm and dry ?NEUROLOGIC:  Alert and oriented x 3 ? ? ?ECG: ?  ? ?Assessment / Plan:  ? ?1.  Sick sinus syndrome -  - s/p pacer  ?Followed by EP  ? ?2.  CAD:   has an occluded LCx. ?No angina .   Is not as active as he could be . ?Cont meds ?Check lipids , ALT, BMP today  ? ? ?Kristeen Miss, MD  ?09/26/2021 1:48 PM    ?[facility] ?[address] [place],  [address] ?[place], [place]  [address] ?Pager [phone] ?Phone: [phone]; Fax: [phone]  ? ? ? ? ?

## 2021-09-26 NOTE — Patient Instructions (Signed)
Medication Instructions:  ?Your physician recommends that you continue on your current medications as directed. Please refer to the Current Medication list given to you today. ? ?*If you need a refill on your cardiac medications before your next appointment, please call your pharmacy* ? ?Lab Work: ?TODAY: Lipids, ALT, BMP ?If you have labs (blood work) drawn today and your tests are completely normal, you will receive your results only by: ?MyChart Message (if you have MyChart) OR ?A paper copy in the mail ?If you have any lab test that is abnormal or we need to change your treatment, we will call you to review the results. ? ?Testing/Procedures: ?NONE ? ?Follow-Up: ?At [facility], you and your health needs are our priority.  As part of our continuing mission to provide you with exceptional heart care, we have created designated Provider Care Teams.  These Care Teams include your primary Cardiologist (physician) and Advanced Practice Providers (APPs -  Physician Assistants and Nurse Practitioners) who all work together to provide you with the care you need, when you need it. ? ?Your next appointment:   ?1 year(s) ? ?The format for your next appointment:   ?In Person ? ?Provider:   ?Philip Nahser, MD  or Vin Bhagat, PA-C or Scott Weaver, PA-C  ?

## 2021-09-27 LAB — BASIC METABOLIC PANEL
BUN/Creatinine Ratio: 13 (ref 10–24)
BUN: 13 mg/dL (ref 8–27)
CO2: 23 mmol/L (ref 20–29)
Calcium: 8.9 mg/dL (ref 8.6–10.2)
Chloride: 105 mmol/L (ref 96–106)
Creatinine, Ser: 0.99 mg/dL (ref 0.76–1.27)
Glucose: 84 mg/dL (ref 70–99)
Potassium: 4.3 mmol/L (ref 3.5–5.2)
Sodium: 141 mmol/L (ref 134–144)
eGFR: 75 mL/min/{1.73_m2} (ref >59)

## 2021-09-27 LAB — LIPID PANEL
Chol/HDL Ratio: 4.1 ratio (ref 0.0–5.0)
Cholesterol, Total: 119 mg/dL (ref 100–199)
HDL: 29 mg/dL — ABNORMAL LOW (ref >39)
LDL Chol Calc (NIH): 75 mg/dL (ref 0–99)
Triglycerides: 72 mg/dL (ref 0–149)
VLDL Cholesterol Cal: 15 mg/dL (ref 5–40)

## 2021-09-27 LAB — ALT: ALT: 8 IU/L (ref 0–44)

## 2021-10-06 NOTE — Telephone Encounter (Signed)
Transmission received 09-23-2021 ?

## 2021-10-07 NOTE — Progress Notes (Signed)
Remote pacemaker transmission.   

## 2021-12-16 ENCOUNTER — Ambulatory Visit: Payer: Medicare Other | Admitting: Adult Health

## 2021-12-22 ENCOUNTER — Ambulatory Visit: Payer: Medicare Other | Admitting: Adult Health

## 2021-12-23 ENCOUNTER — Ambulatory Visit (INDEPENDENT_AMBULATORY_CARE_PROVIDER_SITE_OTHER): Payer: Medicare Other

## 2021-12-23 DIAGNOSIS — I495 Sick sinus syndrome: Secondary | ICD-10-CM

## 2021-12-23 LAB — CUP PACEART REMOTE DEVICE CHECK
Battery Remaining Longevity: 63 mo
Battery Voltage: 2.99 V
Brady Statistic AP VP Percent: 0.01 %
Brady Statistic AP VS Percent: 6.17 %
Brady Statistic AS VP Percent: 0.04 %
Brady Statistic AS VS Percent: 93.77 %
Brady Statistic RA Percent Paced: 6.16 %
Brady Statistic RV Percent Paced: 0.06 %
Date Time Interrogation Session: 20230613130702
Implantable Lead Implant Date: 20170210
Implantable Lead Implant Date: 20170210
Implantable Lead Location: 753859
Implantable Lead Location: 753860
Implantable Lead Model: 5076
Implantable Lead Model: 5076
Implantable Pulse Generator Implant Date: 20170210
Lead Channel Impedance Value: 304 Ohm
Lead Channel Impedance Value: 323 Ohm
Lead Channel Impedance Value: 399 Ohm
Lead Channel Impedance Value: 437 Ohm
Lead Channel Pacing Threshold Amplitude: 0.625 V
Lead Channel Pacing Threshold Amplitude: 1 V
Lead Channel Pacing Threshold Pulse Width: 0.4 ms
Lead Channel Pacing Threshold Pulse Width: 0.4 ms
Lead Channel Sensing Intrinsic Amplitude: 1.5 mV
Lead Channel Sensing Intrinsic Amplitude: 1.5 mV
Lead Channel Sensing Intrinsic Amplitude: 14.625 mV
Lead Channel Sensing Intrinsic Amplitude: 14.625 mV
Lead Channel Setting Pacing Amplitude: 2 V
Lead Channel Setting Pacing Amplitude: 2.5 V
Lead Channel Setting Pacing Pulse Width: 0.4 ms
Lead Channel Setting Sensing Sensitivity: 2.8 mV

## 2022-03-02 NOTE — Progress Notes (Unsigned)
PATIENT: Colton Bennett DOB: 1934/01/07  REASON FOR VISIT: follow up HISTORY FROM: patient Primary neurologist: Dr. Frances Furbish  Chief Complaint  Patient presents with   Follow-up    Pt in 18  Pt here for CPAP f/u  Pt states doing well on CPAP Pt states he wants to discuss inspire      HISTORY OF PRESENT ILLNESS: Today 03/03/22:  Mr. Colton Bennett is an 88 year old male with a history of obstructive sleep apnea on CPAP.  He returns today for follow-up.  Reports that the CPAP continues to work well for him. Reports that if he doesn't use it he cant tell a difference.  Reports that he is unable to sleep. Interested in the inspire device. DL is below.      12/16/20: Mr. Colton Bennett is an 88 year old male with a history of obstructive sleep apnea on CPAP.  He reports that the CPAP is working well for him.  He denies any new issues.  He returns today for an evaluation.    12/14/19: Mr. Colton Bennett is a 88 year old male with a history of obstructive sleep apnea on CPAP. He returns today for follow-up. His download indicates that he uses machine nightly for compliance of 100%. He uses machine greater than 4 hours each night. On average he uses his machine 7 hours and 17 minutes. His residual AHI is 2.8 on 8 cm of water with an EPR of one. Reports CPAP works well.   HISTORY 12/07/2018: I reviewed his CPAP compliance data from 11/07/2018 through 12/06/2018 which is a total of 30 days, during which time he used his machine every night with percent used days greater than 4 hours at 100%, indicating superb compliance with an average usage of 7 hours and 29 minutes, residual AHI at goal at 3.9/h, leak acceptable with a 95th percentile at 9.1 L/min on a pressure set at 8 cm without EPR.  His set up date was 09/20/2018.  REVIEW OF SYSTEMS: Out of a complete 14 system review of symptoms, the patient complains only of the following symptoms, and all other reviewed systems are negative.  FSS 22 ESS 3  ALLERGIES: Allergies   Allergen Reactions   Statins     HOME MEDICATIONS: Outpatient Medications Prior to Visit  Medication Sig Dispense Refill   alfuzosin (UROXATRAL) 10 MG 24 hr tablet Take 10 mg by mouth daily before breakfast.      Ascorbic Acid (VITAMIN C) 1000 MG tablet Take 1,000 mg by mouth daily.     aspirin EC 81 MG tablet Take 1 tablet (81 mg total) by mouth daily.     Cyanocobalamin (B-12) 5000 MCG CAPS Take 5,000 mcg by mouth daily.     ezetimibe (ZETIA) 10 MG tablet TAKE 1 TABLET DAILY 90 tablet 3   omeprazole (PRILOSEC) 40 MG capsule Take 40 mg by mouth daily.     nitroGLYCERIN (NITROSTAT) 0.4 MG SL tablet Place 1 tablet (0.4 mg total) under the tongue every 5 (five) minutes as needed for chest pain. (Patient not taking: Reported on 03/03/2022) 25 tablet 6   No facility-administered medications prior to visit.    PAST MEDICAL HISTORY: Past Medical History:  Diagnosis Date   Chest pain    Dizziness    DOE (dyspnea on exertion)    GERD (gastroesophageal reflux disease)    History of cardiovascular stress test    ETT-Myoview (06/07/13): No ischemia, EF 56%; normal study.   History of echocardiogram    Echocardiogram (01/2013): EF  of 50-55%, normal wall motion, trivial AI, mild MR, mild to moderate LAE   OSA on CPAP    Presence of permanent cardiac pacemaker    SSS (sick sinus syndrome) (HCC)    Syncope, near     PAST SURGICAL HISTORY: Past Surgical History:  Procedure Laterality Date   EP IMPLANTABLE DEVICE N/A 08/23/2015   MDT Advisa DR MRI compatible pacemaker implanted by Dr Johney Frame for sick sinus and syncope   EP IMPLANTABLE DEVICE N/A 08/23/2015   Procedure: Loop Recorder Removal;  Surgeon: Hillis Range, MD;  Location: [facility];  Service: Cardiovascular;  Laterality: N/A;   LEFT HEART CATH AND CORONARY ANGIOGRAPHY N/A 10/25/2019   Procedure: LEFT HEART CATH AND CORONARY ANGIOGRAPHY;  Surgeon: Marykay Lex, MD;  Location: [facility] INVASIVE CV LAB;  Service: Cardiovascular;   Laterality: N/A;   LOOP RECORDER IMPLANT  September 2014   LOOP RECORDER IMPLANT N/A 04/05/2013   Procedure: LOOP RECORDER IMPLANT;  Surgeon: Gardiner Rhyme, MD;  Location: [facility] CATH [facility];  Service: Cardiovascular;  Laterality: N/A;    FAMILY HISTORY: Family History  Problem Relation Age of Onset   Other Father        Died at age 12  of unknown cause   Lung cancer Brother    Dementia Brother    Suicidality Brother        died of suicide   Lymphoma Brother    CAD Neg Hx    Sleep apnea Neg Hx     SOCIAL HISTORY: Social History   Socioeconomic History   Marital status: Married    Spouse name: Not on file   Number of children: 1   Years of education: Not on file   Highest education level: Not on file  Occupational History   Occupation: 'retired  Tobacco Use   Smoking status: Former    Packs/day: 3.00    Years: 30.00    Total pack years: 90.00    Types: Cigarettes    Quit date: 02/01/1982    Years since quitting: 40.1   Smokeless tobacco: Never  Vaping Use   Vaping Use: Never used  Substance and Sexual Activity   Alcohol use: No    Alcohol/week: 0.0 standard drinks of alcohol   Drug use: No   Sexual activity: Not on file  Other Topics Concern   Not on file  Social History Narrative   Not on file   Social Determinants of Health   Financial Resource Strain: Not on file  Food Insecurity: Not on file  Transportation Needs: Not on file  Physical Activity: Not on file  Stress: Not on file  Social Connections: Not on file  Intimate Partner Violence: Not on file      PHYSICAL EXAM  Vitals:   03/03/22 0815  BP: (!) 99/55  Pulse: 61  Weight: 187 lb (84.8 kg)  Height: 6' (1.829 m)   Body mass index is 25.36 kg/m.  Generalized: Well developed, in no acute distress  Chest: Lungs clear to auscultation bilaterally  Neurological examination  Mentation: Alert oriented to time, place, history taking. Follows all commands speech and language fluent Cranial nerve  II-XII: Extraocular movements were full, visual field were full on confrontational test Head turning and shoulder shrug  were normal and symmetric. Motor: The motor testing reveals 5 over 5 strength of all 4 extremities. Good symmetric motor tone is noted throughout.  Sensory: Sensory testing is intact to soft touch on all 4 extremities. No evidence  of extinction is noted.  Gait and station: Gait is normal.    DIAGNOSTIC DATA (LABS, IMAGING, TESTING) - I reviewed patient records, labs, notes, testing and imaging myself where available.  Lab Results  Component Value Date   WBC 7.6 10/20/2019   HGB 12.9 (L) 10/20/2019   HCT 38.4 10/20/2019   MCV 92 10/20/2019   PLT 202 10/20/2019      Component Value Date/Time   NA 141 09/26/2021 1404   K 4.3 09/26/2021 1404   CL 105 09/26/2021 1404   CO2 23 09/26/2021 1404   GLUCOSE 84 09/26/2021 1404   GLUCOSE 108 (H) 08/24/2015 0301   BUN 13 09/26/2021 1404   CREATININE 0.99 09/26/2021 1404   CALCIUM 8.9 09/26/2021 1404   PROT 6.5 06/20/2020 0901   ALBUMIN 3.8 06/20/2020 0901   AST 9 06/20/2020 0901   ALT 8 09/26/2021 1404   ALKPHOS 84 06/20/2020 0901   BILITOT 1.2 06/20/2020 0901   GFRNONAA 62 03/15/2020 1006   GFRAA 72 03/15/2020 1006   Lab Results  Component Value Date   CHOL 119 09/26/2021   HDL 29 (L) 09/26/2021   LDLCALC 75 09/26/2021   LDLDIRECT 86 03/19/2020   TRIG 72 09/26/2021   CHOLHDL 4.1 09/26/2021   Lab Results  Component Value Date   HGBA1C 5.8 (H) 02/01/2013   No results found for: "VITAMINB12" Lab Results  Component Value Date   TSH 3.440 02/01/2013      ASSESSMENT AND PLAN 88 y.o. year old male  has a past medical history of Chest pain, Dizziness, DOE (dyspnea on exertion), GERD (gastroesophageal reflux disease), History of cardiovascular stress test, History of echocardiogram, OSA on CPAP, Presence of permanent cardiac pacemaker, SSS (sick sinus syndrome) (HCC), and Syncope, near. here with:  OSA on  CPAP  - CPAP compliance excellent - Good treatment of AHI  - Encourage patient to use CPAP nightly and > 4 hours each night - Reviewed inspire device with the patient he deferred for now - F/U in 1 year or sooner if needed     Butch Penny, MSN, NP-C 03/03/2022, 8:41 AM [facility] [address] [place], [place] [address] [phone]

## 2022-03-03 ENCOUNTER — Ambulatory Visit (INDEPENDENT_AMBULATORY_CARE_PROVIDER_SITE_OTHER): Payer: Medicare Other | Admitting: Adult Health

## 2022-03-03 ENCOUNTER — Encounter: Payer: Self-pay | Admitting: Adult Health

## 2022-03-03 VITALS — BP 99/55 | HR 61 | Ht 72.0 in | Wt 187.0 lb

## 2022-03-03 DIAGNOSIS — Z9989 Dependence on other enabling machines and devices: Secondary | ICD-10-CM

## 2022-03-03 DIAGNOSIS — G4733 Obstructive sleep apnea (adult) (pediatric): Secondary | ICD-10-CM

## 2022-03-03 NOTE — Patient Instructions (Signed)
Continue using CPAP nightly and greater than 4 hours each night °If your symptoms worsen or you develop new symptoms please let us know.  ° °

## 2022-03-07 ENCOUNTER — Other Ambulatory Visit: Payer: Self-pay | Admitting: Cardiovascular Disease

## 2022-03-07 DIAGNOSIS — E785 Hyperlipidemia, unspecified: Secondary | ICD-10-CM

## 2022-03-24 ENCOUNTER — Ambulatory Visit (INDEPENDENT_AMBULATORY_CARE_PROVIDER_SITE_OTHER): Payer: Medicare Other

## 2022-03-24 DIAGNOSIS — I495 Sick sinus syndrome: Secondary | ICD-10-CM

## 2022-03-25 LAB — CUP PACEART REMOTE DEVICE CHECK
Battery Remaining Longevity: 59 mo
Battery Voltage: 2.99 V
Brady Statistic AP VP Percent: 0.03 %
Brady Statistic AP VS Percent: 16.43 %
Brady Statistic AS VP Percent: 0.04 %
Brady Statistic AS VS Percent: 83.5 %
Brady Statistic RA Percent Paced: 16.39 %
Brady Statistic RV Percent Paced: 0.07 %
Date Time Interrogation Session: 20230913081600
Implantable Lead Implant Date: 20170210
Implantable Lead Implant Date: 20170210
Implantable Lead Location: 753859
Implantable Lead Location: 753860
Implantable Lead Model: 5076
Implantable Lead Model: 5076
Implantable Pulse Generator Implant Date: 20170210
Lead Channel Impedance Value: 342 Ohm
Lead Channel Impedance Value: 361 Ohm
Lead Channel Impedance Value: 418 Ohm
Lead Channel Impedance Value: 456 Ohm
Lead Channel Pacing Threshold Amplitude: 0.5 V
Lead Channel Pacing Threshold Amplitude: 1 V
Lead Channel Pacing Threshold Pulse Width: 0.4 ms
Lead Channel Pacing Threshold Pulse Width: 0.4 ms
Lead Channel Sensing Intrinsic Amplitude: 16 mV
Lead Channel Sensing Intrinsic Amplitude: 16 mV
Lead Channel Sensing Intrinsic Amplitude: 2.5 mV
Lead Channel Sensing Intrinsic Amplitude: 2.5 mV
Lead Channel Setting Pacing Amplitude: 2 V
Lead Channel Setting Pacing Amplitude: 2.5 V
Lead Channel Setting Pacing Pulse Width: 0.4 ms
Lead Channel Setting Sensing Sensitivity: 2.8 mV

## 2022-04-09 NOTE — Progress Notes (Signed)
Remote pacemaker transmission.   

## 2022-04-22 ENCOUNTER — Telehealth: Payer: Self-pay | Admitting: Cardiovascular Disease

## 2022-04-22 NOTE — Telephone Encounter (Signed)
Pt c/o of Chest Pain: STAT if CP now or developed within 24 hours  1. Are you having CP right now? Not at this time  2. Are you experiencing any other symptoms (ex. SOB, nausea, vomiting, sweating)? None of these symptoms  3. How long have you been experiencing CP? About a month  4. Is your CP continuous or coming and going? comes and goes  5. Have you taken Nitroglycerin? No- patient says he have been having light chest pains off and on. He wanted an appointment. I made him an appointment for Friday(04-24-22 )wi,th Dr Acie Fredrickson  ?

## 2022-04-24 ENCOUNTER — Encounter: Payer: Self-pay | Admitting: Cardiovascular Disease

## 2022-04-24 ENCOUNTER — Ambulatory Visit: Payer: Medicare Other | Attending: Cardiovascular Disease | Admitting: Cardiovascular Disease

## 2022-04-24 VITALS — BP 118/60 | HR 60 | Ht 72.0 in | Wt 184.0 lb

## 2022-04-24 DIAGNOSIS — I2510 Atherosclerotic heart disease of native coronary artery without angina pectoris: Secondary | ICD-10-CM | POA: Insufficient documentation

## 2022-04-24 DIAGNOSIS — E785 Hyperlipidemia, unspecified: Secondary | ICD-10-CM

## 2022-04-24 NOTE — Progress Notes (Signed)
Colton Bennett Date of Birth  [birth year]       [facility]    [place] [address]  [address] [place], [place]  [address]   [place], [place]  [address] [phone]     [phone]   Fax  [phone]    Fax [phone]  Problem List: 1. Syncope  2. Dyspnea 3.  Sick sinus syndrome -  S/p pacer    Colton Bennett is a 88 yo with a recent episode of collapse.  He was seen with his wife who provided most of the accurate history - he seems to play his symptoms down quite a bit.    He got out of his truck and collapsed in the parking lot.  He was talking and remained conscious the whole time.   He had severe nausea and vomitting after that.   He remained unsteady and was not able to walk for several hours.  He did not want to go to the doctor or ER.    He felt better after several days.  He were 30 day event monitor. He was found to have normal sinus rhythm. He had occasional episodes of frequent premature ventricular contractions-occasionally in a bigeminal pattern.  He did not have any symptoms while wearing the monitor.  He had an echocardiogram which revealed normal left ventricle systolic function with an ejection fraction of 50-55%. He had normal wall motion. He had trivial aortic insufficiency, mild mitral regurgitation. The left atrium was mildly to moderately dilated.  His EEG was normal.  He's had 2 different sleep studies. The first study was July 31 which revealed severe sleep apnea during REM sleep. He had a second sleep study the results of which are not known at this time.  His MRI was normal. The head MRA is normal.  September 27, 2013:  Mr. Doxtater has had an implantable loop recorder placed.   He's had a few episodes of presyncope.  He exercises on occasion. He denies any chest pain or shortness breath.  He was found to have a 3 second pause with his ILR.    Sept. 16,2015:  Jaquis has done well.  No further syncopal episodes.    Still has the implantable loop.    Has been busy working in his garden this summer.   Sept. 19 , 2016: Had an episode of dizziness 2 weeks ago .  No CP   Sept. 7, 2017:  Colton Bennett is seen today for follow up visit No further passing out episodes.  No CP or dyspnea  Sept. 24, 2018  Colton Bennett is seen , some DOE (according to wife) but he thinks hes doing fine .  No CP , or dizziness.  Gets pacer checked remotely   Sept. 24, 2019: Colton Bennett is seen back today for follow-up of his sick sinus syndrome.  He had a pacemaker placed several years ago and feels well. Also has a history of obstructive sleep apnea.  No CP or dyspnea  Exercised until his foot started hurting . No CP or dyspnea.   August 23, 2019: Colton Bennett is seen back today for follow-up of his sick sinus syndrome.  He had a pacemaker placed in his followed up with Dr. Graciela Husbands and  the EP team.  He was recently seen by Dr. Delano Metz for bilateral pneumonia in January.  He lost his sense of tast and smell - has returned now.  Never had a fever .  Has  tested negative for COVID  X 2 .  Took him a long time to get over his pneumonia   He was seen by Lizabeth Leyden, NP in November, 2020 for chest pain. Lexiscan Myoview study at that time revealed no ischemia and normal ejection fraction.  Is not having any other issues.     December 21, 2019  Colton Bennett is seen for follow up of his sick sinus syndrome. Has had a pacer placed.  No cp or dyspnea. Cath in April 2021 shows occlusion of the LCx just after a large marginal .   The distal LCx fills fairly well with collaterals   Jan. 4, 2022: Colton Bennett is seen for follow up of his SSS and CAD.   Has LCx occlusion with good collateral filling no CP , dyspnea, syncope Recent lipids look good on Zetia   September 26, 2021:   Colton Bennett is seen for follow up visit for his SSS and CAD  No cp or dyspnea.  Gets some exercise Has some vague "uncomfortable" feeling but this is not related to exertion   Oct. 13, 2023  Colton Bennett is see  for follow up of his SSS and CAD  Is having some chest pain  Does not take NTG  - the pain was not severe enough to take NRG Occurs with rest and exertion .     Current Outpatient Medications on File Prior to Visit  Medication Sig Dispense Refill   alfuzosin (UROXATRAL) 10 MG 24 hr tablet Take 10 mg by mouth daily before breakfast.      Ascorbic Acid (VITAMIN C) 1000 MG tablet Take 1,000 mg by mouth daily.     aspirin EC 81 MG tablet Take 1 tablet (81 mg total) by mouth daily.     Cyanocobalamin (B-12) 5000 MCG CAPS Take 5,000 mcg by mouth daily.     ezetimibe (ZETIA) 10 MG tablet TAKE 1 TABLET DAILY 90 tablet 2   nitroGLYCERIN (NITROSTAT) 0.4 MG SL tablet Place 1 tablet (0.4 mg total) under the tongue every 5 (five) minutes as needed for chest pain. 25 tablet 6   omeprazole (PRILOSEC) 40 MG capsule Take 40 mg by mouth daily.     No current facility-administered medications on file prior to visit.    Allergies  Allergen Reactions   Statins     Past Medical History:  Diagnosis Date   Chest pain    Dizziness    DOE (dyspnea on exertion)    GERD (gastroesophageal reflux disease)    History of cardiovascular stress test    ETT-Myoview (06/07/13): No ischemia, EF 56%; normal study.   History of echocardiogram    Echocardiogram (01/2013): EF of 50-55%, normal wall motion, trivial AI, mild MR, mild to moderate LAE   OSA on CPAP    Presence of permanent cardiac pacemaker    SSS (sick sinus syndrome) (HCC)    Syncope, near     Past Surgical History:  Procedure Laterality Date   EP IMPLANTABLE DEVICE N/A 08/23/2015   MDT Advisa DR MRI compatible pacemaker implanted by Dr Johney Frame for sick sinus and syncope   EP IMPLANTABLE DEVICE N/A 08/23/2015   Procedure: Loop Recorder Removal;  Surgeon: Hillis Range, MD;  Location: [facility] INVASIVE CV LAB;  Service: Cardiovascular;  Laterality: N/A;   LEFT HEART CATH AND CORONARY ANGIOGRAPHY N/A 10/25/2019   Procedure: LEFT HEART CATH AND CORONARY  ANGIOGRAPHY;  Surgeon: Marykay Lex, MD;  Location: [facility] INVASIVE CV LAB;  Service: Cardiovascular;  Laterality: N/A;  LOOP RECORDER IMPLANT  September 2014   LOOP RECORDER IMPLANT N/A 04/05/2013   Procedure: LOOP RECORDER IMPLANT;  Surgeon: Coralyn Mark, MD;  [facility]: [facility] CATH LAB;  Service: Cardiovascular;  Laterality: N/A;    Social History   Tobacco Use  Smoking Status Former   Packs/day: 3.00   Years: 30.00   Total pack years: 90.00   Types: Cigarettes   Quit date: 02/01/1982   Years since quitting: 40.2  Smokeless Tobacco Never    Social History   Substance and Sexual Activity  Alcohol Use No   Alcohol/week: 0.0 standard drinks of alcohol    Family History  Problem Relation Age of Onset   Other Father        Died at age 14  of unknown cause   Lung cancer Brother    Dementia Brother    Suicidality Brother        died of suicide   Lymphoma Brother    CAD Neg Hx    Sleep apnea Neg Hx     Reviw of Systems:  Reviewed in the HPI.  All other systems are negative.   Physical Exam: Blood pressure 118/60, pulse 60, height 6' (1.829 m), weight 184 lb (83.5 kg), SpO2 98 %.       GEN:  thin, elderly male, in no acute distress HEENT: Normal NECK: No JVD; No carotid bruits LYMPHATICS: No lymphadenopathy CARDIAC: RRR , no murmurs, rubs, gallops RESPIRATORY:  Clear to auscultation without rales, wheezing or rhonchi  ABDOMEN: Soft, non-tender, non-distended MUSCULOSKELETAL:  No edema; No deformity  SKIN: Warm and dry NEUROLOGIC:  Alert and oriented x 3  ECG: April 24, 2022: Normal sinus rhythm at 60.  No ST or T wave changes.    Assessment / Plan:   1.  Sick sinus syndrome -  -  stable   2.  CAD:   he describes a vague / mild CP .  Occurs with rest and exertion .   Does not last long.   Does not take NTG.  Will see him again in 6 months.  If his chest pains worsen, will refer to [facility] testing .    Mertie Moores, MD  04/24/2022 2:01 PM     [facility] [place],  [place], [place]  [address] Pager [phone] Phone: [phone]; Fax: [phone]

## 2022-04-24 NOTE — Patient Instructions (Signed)
Medication Instructions:  Your physician recommends that you continue on your current medications as directed. Please refer to the Current Medication list given to you today.  *If you need a refill on your cardiac medications before your next appointment, please call your pharmacy*  Lab Work: NONE If you have labs (blood work) drawn today and your tests are completely normal, you will receive your results only by: MyChart Message (if you have MyChart) OR A paper copy in the mail If you have any lab test that is abnormal or we need to change your treatment, we will call you to review the results.  Testing/Procedures: NONE  Follow-Up: At [facility], you and your health needs are our priority.  As part of our continuing mission to provide you with exceptional heart care, we have created designated Provider Care Teams.  These Care Teams include your primary Cardiologist (physician) and Advanced Practice Providers (APPs -  Physician Assistants and Nurse Practitioners) who all work together to provide you with the care you need, when you need it.  Your next appointment:   6 month(s)  The format for your next appointment:   In Person  Provider:   Philip Nahser, MD    Important Information About Sugar       

## 2022-06-24 ENCOUNTER — Telehealth: Payer: Self-pay | Admitting: Cardiovascular Disease

## 2022-06-24 NOTE — Telephone Encounter (Signed)
   Pt is calling to let device clinic know that [employer] will be sending him a new monitor and it make take 10 business day to receive it

## 2022-06-30 ENCOUNTER — Ambulatory Visit (INDEPENDENT_AMBULATORY_CARE_PROVIDER_SITE_OTHER): Payer: Medicare Other

## 2022-06-30 DIAGNOSIS — I495 Sick sinus syndrome: Secondary | ICD-10-CM

## 2022-06-30 LAB — CUP PACEART REMOTE DEVICE CHECK
Battery Remaining Longevity: 58 mo
Battery Voltage: 2.99 V
Brady Statistic AP VP Percent: 0.02 %
Brady Statistic AP VS Percent: 7.94 %
Brady Statistic AS VP Percent: 0.04 %
Brady Statistic AS VS Percent: 92.01 %
Brady Statistic RA Percent Paced: 7.92 %
Brady Statistic RV Percent Paced: 0.06 %
Date Time Interrogation Session: 20231219103635
Implantable Lead Connection Status: 753985
Implantable Lead Connection Status: 753985
Implantable Lead Implant Date: 20170210
Implantable Lead Implant Date: 20170210
Implantable Lead Location: 753859
Implantable Lead Location: 753860
Implantable Lead Model: 5076
Implantable Lead Model: 5076
Implantable Pulse Generator Implant Date: 20170210
Lead Channel Impedance Value: 304 Ohm
Lead Channel Impedance Value: 323 Ohm
Lead Channel Impedance Value: 399 Ohm
Lead Channel Impedance Value: 437 Ohm
Lead Channel Pacing Threshold Amplitude: 0.625 V
Lead Channel Pacing Threshold Amplitude: 0.875 V
Lead Channel Pacing Threshold Pulse Width: 0.4 ms
Lead Channel Pacing Threshold Pulse Width: 0.4 ms
Lead Channel Sensing Intrinsic Amplitude: 14.375 mV
Lead Channel Sensing Intrinsic Amplitude: 14.375 mV
Lead Channel Sensing Intrinsic Amplitude: 2 mV
Lead Channel Sensing Intrinsic Amplitude: 2 mV
Lead Channel Setting Pacing Amplitude: 2 V
Lead Channel Setting Pacing Amplitude: 2.5 V
Lead Channel Setting Pacing Pulse Width: 0.4 ms
Lead Channel Setting Sensing Sensitivity: 2.8 mV
Zone Setting Status: 755011
Zone Setting Status: 755011

## 2022-06-30 NOTE — Telephone Encounter (Signed)
Patient called in letting us know his monitor came in and he has sent in a transmission

## 2022-07-27 NOTE — Progress Notes (Signed)
Remote pacemaker transmission.   

## 2022-09-24 ENCOUNTER — Telehealth: Payer: Self-pay

## 2022-09-24 NOTE — Telephone Encounter (Signed)
Alert received from [employer]:  Nonbillable remote reviewed. Normal device function.   There were two NSVT arrhythmias detected and one was greater than 20 beats, sent to triage. Next remote 09/29/2022.  Pt overdue for follow up.  Message sent to scheduling to schedule for EP evaluation.    Left message for Pt requesting call back to schedule.

## 2022-09-29 ENCOUNTER — Ambulatory Visit (INDEPENDENT_AMBULATORY_CARE_PROVIDER_SITE_OTHER): Payer: Medicare Other

## 2022-09-29 DIAGNOSIS — I495 Sick sinus syndrome: Secondary | ICD-10-CM

## 2022-10-02 LAB — CUP PACEART REMOTE DEVICE CHECK
Battery Remaining Longevity: 56 mo
Battery Voltage: 2.98 V
Brady Statistic AP VP Percent: 0.02 %
Brady Statistic AP VS Percent: 3.96 %
Brady Statistic AS VP Percent: 0.04 %
Brady Statistic AS VS Percent: 95.98 %
Brady Statistic RA Percent Paced: 3.97 %
Brady Statistic RV Percent Paced: 0.06 %
Date Time Interrogation Session: 20240321160424
Implantable Lead Connection Status: 753985
Implantable Lead Connection Status: 753985
Implantable Lead Implant Date: 20170210
Implantable Lead Implant Date: 20170210
Implantable Lead Location: 753859
Implantable Lead Location: 753860
Implantable Lead Model: 5076
Implantable Lead Model: 5076
Implantable Pulse Generator Implant Date: 20170210
Lead Channel Impedance Value: 285 Ohm
Lead Channel Impedance Value: 304 Ohm
Lead Channel Impedance Value: 399 Ohm
Lead Channel Impedance Value: 418 Ohm
Lead Channel Pacing Threshold Amplitude: 0.75 V
Lead Channel Pacing Threshold Amplitude: 0.875 V
Lead Channel Pacing Threshold Pulse Width: 0.4 ms
Lead Channel Pacing Threshold Pulse Width: 0.4 ms
Lead Channel Sensing Intrinsic Amplitude: 14.375 mV
Lead Channel Sensing Intrinsic Amplitude: 14.375 mV
Lead Channel Sensing Intrinsic Amplitude: 2.625 mV
Lead Channel Sensing Intrinsic Amplitude: 2.625 mV
Lead Channel Setting Pacing Amplitude: 2 V
Lead Channel Setting Pacing Amplitude: 2.5 V
Lead Channel Setting Pacing Pulse Width: 0.4 ms
Lead Channel Setting Sensing Sensitivity: 2.8 mV
Zone Setting Status: 755011
Zone Setting Status: 755011

## 2022-10-04 NOTE — Progress Notes (Unsigned)
Cardiology Office Note Date:  10/04/2022  Patient ID:  Colton Bennett, Colton Bennett 1934-05-03, MRN [id number] PCP:  Curly Rim, MD  Cardiologist:  Dr. Acie Fredrickson Electrophysiologist: Dr. Rayann Heman  ***refresh   Chief Complaint: *** annual visit  History of Present Illness: Colton Bennett is a 89 y.o. male with history of GERD, OSA w/CPAP, SSSx w/PPM, CAD (cath 2021 occl Cx after OM, fills w/collaterals)  He saw Dr. Rayann Heman 02/10/21, no complaints, no changes were made.  He has seen Dr. Acie Fredrickson, last 04/24/22, doing well , some CP, not using s/l NTG, no changes made.  *** remotes w/AM *** symptoms   Device information MDT dual chamber PPM implanted 08/23/2015   Past Medical History:  Diagnosis Date   Chest pain    Dizziness    DOE (dyspnea on exertion)    GERD (gastroesophageal reflux disease)    History of cardiovascular stress test    ETT-Myoview (06/07/13): No ischemia, EF 56%; normal study.   History of echocardiogram    Echocardiogram (01/2013): EF of 50-55%, normal wall motion, trivial AI, mild MR, mild to moderate LAE   OSA on CPAP    Presence of permanent cardiac pacemaker    SSS (sick sinus syndrome) (HCC)    Syncope, near     Past Surgical History:  Procedure Laterality Date   EP IMPLANTABLE DEVICE N/A 08/23/2015   MDT Advisa DR MRI compatible pacemaker implanted by Dr Rayann Heman for sick sinus and syncope   EP IMPLANTABLE DEVICE N/A 08/23/2015   Procedure: Loop Recorder Removal;  Surgeon: Thompson Grayer, MD;  Location: [facility] CV LAB;  Service: Cardiovascular;  Laterality: N/A;   LEFT HEART CATH AND CORONARY ANGIOGRAPHY N/A 10/25/2019   Procedure: LEFT HEART CATH AND CORONARY ANGIOGRAPHY;  Surgeon: Leonie Man, MD;  Location: [facility] CV LAB;  Service: Cardiovascular;  Laterality: N/A;   LOOP RECORDER IMPLANT  September 2014   LOOP RECORDER IMPLANT N/A 04/05/2013   Procedure: LOOP RECORDER IMPLANT;  Surgeon: Coralyn Mark, MD;  Location: [facility] CATH LAB;  Service:  Cardiovascular;  Laterality: N/A;    Current Outpatient Medications  Medication Sig Dispense Refill   alfuzosin (UROXATRAL) 10 MG 24 hr tablet Take 10 mg by mouth daily before breakfast.      Ascorbic Acid (VITAMIN C) 1000 MG tablet Take 1,000 mg by mouth daily.     aspirin EC 81 MG tablet Take 1 tablet (81 mg total) by mouth daily.     Cyanocobalamin (B-12) 5000 MCG CAPS Take 5,000 mcg by mouth daily.     ezetimibe (ZETIA) 10 MG tablet TAKE 1 TABLET DAILY 90 tablet 2   nitroGLYCERIN (NITROSTAT) 0.4 MG SL tablet Place 1 tablet (0.4 mg total) under the tongue every 5 (five) minutes as needed for chest pain. 25 tablet 6   omeprazole (PRILOSEC) 40 MG capsule Take 40 mg by mouth daily.     No current facility-administered medications for this visit.    Allergies:   Statins   Social History:  The patient  reports that he quit smoking about 40 years ago. His smoking use included cigarettes. He has a 90.00 pack-year smoking history. He has never used smokeless tobacco. He reports that he does not drink alcohol and does not use drugs.   Family History:  The patient's family history includes Dementia in his brother; Lung cancer in his brother; Lymphoma in his brother; Other in his father; Suicidality in his brother.  ROS:  Please see  the history of present illness.    All other systems are reviewed and otherwise negative.   PHYSICAL EXAM:  VS:  There were no vitals taken for this visit. BMI: There is no height or weight on file to calculate BMI. Well nourished, well developed, in no acute distress HEENT: normocephalic, atraumatic Neck: no JVD, carotid bruits or masses Cardiac:  *** RRR; no significant murmurs, no rubs, or gallops Lungs:  *** CTA b/l, no wheezing, rhonchi or rales Abd: soft, nontender MS: no deformity or *** atrophy Ext: *** no edema Skin: warm and dry, no rash Neuro:  No gross deficits appreciated Psych: euthymic mood, full affect  *** PPM site is stable, no tethering  or discomfort   EKG:  Done today and reviewed by myself shows  ***  Device interrogation done today and reviewed by myself:  ***  10/25/2019: LHC LPAV lesion is 100% stenosed. The left ventricular systolic function is normal - EJECTION FRACTION is 55-65% by visual estimate. Cannot exclude inferolateral hypokinesis. LV end diastolic pressure is mildly elevated.   SUMMARY Severe Single-Vessel CAD with flush occlusion of the AV groove circumflex, LPL and PDA filled via septal collaterals. Small nondominant RCA with faint collaterals to small AV groove circumflex. Apparently normal LVEF and EDP.    02/02/2013: TTE - Left ventricle: The cavity size was normal. Wall thickness    was normal. Systolic function was normal. The estimated    ejection fraction was in the range of 50% to 55%. Wall    motion was normal; there were no regional wall motion    abnormalities. Left ventricular diastolic function    parameters were normal.  - Aortic valve: Trivial regurgitation.  - Mitral valve: Mild regurgitation.  - Left atrium: The atrium was mildly to moderately dilated.   Recent Labs: No results found for requested labs within last 365 days.  No results found for requested labs within last 365 days.   CrCl cannot be calculated (Patient's most recent lab result is older than the maximum 21 days allowed.).   Wt Readings from Last 3 Encounters:  04/24/22 184 lb (83.5 kg)  03/03/22 187 lb (84.8 kg)  09/26/21 199 lb 12.8 oz (90.6 kg)     Other studies reviewed: Additional studies/records reviewed today include: summarized above  ASSESSMENT AND PLAN:  PPM ***  CAD *** C/w Dr. Acie Fredrickson  HTN ***    Disposition: F/u with ***  Current medicines are reviewed at length with the patient today.  The patient did not have any concerns regarding medicines.  Venetia Night, PA-C 10/04/2022 10:36 AM     [facility] [place] [address] [phone] (office)  [phone] (fax)

## 2022-10-07 ENCOUNTER — Ambulatory Visit: Payer: Medicare Other | Attending: Physician Assistant | Admitting: Physician Assistant

## 2022-10-07 ENCOUNTER — Encounter: Payer: Self-pay | Admitting: Physician Assistant

## 2022-10-07 VITALS — BP 108/54 | HR 65 | Ht 72.0 in | Wt 195.0 lb

## 2022-10-07 DIAGNOSIS — Z79899 Other long term (current) drug therapy: Secondary | ICD-10-CM | POA: Insufficient documentation

## 2022-10-07 DIAGNOSIS — I10 Essential (primary) hypertension: Secondary | ICD-10-CM

## 2022-10-07 DIAGNOSIS — Z950 Presence of cardiac pacemaker: Secondary | ICD-10-CM | POA: Diagnosis present

## 2022-10-07 DIAGNOSIS — I2510 Atherosclerotic heart disease of native coronary artery without angina pectoris: Secondary | ICD-10-CM | POA: Insufficient documentation

## 2022-10-07 DIAGNOSIS — I4729 Other ventricular tachycardia: Secondary | ICD-10-CM | POA: Diagnosis present

## 2022-10-07 LAB — CUP PACEART INCLINIC DEVICE CHECK
Battery Remaining Longevity: 56 mo
Battery Voltage: 2.98 V
Brady Statistic AP VP Percent: 0.02 %
Brady Statistic AP VS Percent: 7.34 %
Brady Statistic AS VP Percent: 0.04 %
Brady Statistic AS VS Percent: 92.6 %
Brady Statistic RA Percent Paced: 7.33 %
Brady Statistic RV Percent Paced: 0.06 %
Date Time Interrogation Session: 20240327182213
Implantable Lead Connection Status: 753985
Implantable Lead Connection Status: 753985
Implantable Lead Implant Date: 20170210
Implantable Lead Implant Date: 20170210
Implantable Lead Location: 753859
Implantable Lead Location: 753860
Implantable Lead Model: 5076
Implantable Lead Model: 5076
Implantable Pulse Generator Implant Date: 20170210
Lead Channel Impedance Value: 342 Ohm
Lead Channel Impedance Value: 342 Ohm
Lead Channel Impedance Value: 418 Ohm
Lead Channel Impedance Value: 456 Ohm
Lead Channel Pacing Threshold Amplitude: 0.625 V
Lead Channel Pacing Threshold Amplitude: 1 V
Lead Channel Pacing Threshold Pulse Width: 0.4 ms
Lead Channel Pacing Threshold Pulse Width: 0.4 ms
Lead Channel Sensing Intrinsic Amplitude: 14.75 mV
Lead Channel Sensing Intrinsic Amplitude: 17.375 mV
Lead Channel Sensing Intrinsic Amplitude: 2 mV
Lead Channel Sensing Intrinsic Amplitude: 2.125 mV
Lead Channel Setting Pacing Amplitude: 2 V
Lead Channel Setting Pacing Amplitude: 2.5 V
Lead Channel Setting Pacing Pulse Width: 0.4 ms
Lead Channel Setting Sensing Sensitivity: 2.8 mV
Zone Setting Status: 755011
Zone Setting Status: 755011

## 2022-10-07 NOTE — Patient Instructions (Signed)
Medication Instructions:   Your physician recommends that you continue on your current medications as directed. Please refer to the Current Medication list given to you today.   *If you need a refill on your cardiac medications before your next appointment, please call your pharmacy*   Lab Work:  [facility] AND [place]    If you have labs (blood work) drawn today and your tests are completely normal, you will receive your results only by: [place] (if you have MyChart) OR A paper copy in the mail If you have any lab test that is abnormal or we need to change your treatment, we will call you to review the results.   Testing/Procedures: NONE ORDERED  TODAY     Follow-Up: At [facility], you and your health needs are our priority.  As part of our continuing mission to provide you with exceptional heart care, we have created designated Provider Care Teams.  These Care Teams include your primary Cardiologist (physician) and Advanced Practice Providers (APPs -  Physician Assistants and Nurse Practitioners) who all work together to provide you with the care you need, when you need it.  We recommend signing up for the patient portal called "MyChart".  Sign up information is provided on this After Visit Summary.  MyChart is used to connect with patients for Virtual Visits (Telemedicine).  Patients are able to view lab/test results, encounter notes, upcoming appointments, etc.  Non-urgent messages can be sent to your provider as well.   To learn more about what you can do with MyChart, go to [email or web address].    Your next appointment:   1 year(s)  Provider:   Doralee Albino, MD   Other Instructions

## 2022-10-08 LAB — MAGNESIUM: Magnesium: 2.2 mg/dL (ref 1.6–2.3)

## 2022-10-08 LAB — BASIC METABOLIC PANEL
BUN/Creatinine Ratio: 14 (ref 10–24)
BUN: 15 mg/dL (ref 8–27)
CO2: 26 mmol/L (ref 20–29)
Calcium: 9.5 mg/dL (ref 8.6–10.2)
Chloride: 106 mmol/L (ref 96–106)
Creatinine, Ser: 1.08 mg/dL (ref 0.76–1.27)
Glucose: 83 mg/dL (ref 70–99)
Potassium: 4.6 mmol/L (ref 3.5–5.2)
Sodium: 143 mmol/L (ref 134–144)
eGFR: 67 mL/min/{1.73_m2} (ref >59)

## 2022-11-09 NOTE — Progress Notes (Signed)
Remote pacemaker transmission.   

## 2022-11-11 ENCOUNTER — Other Ambulatory Visit: Payer: Self-pay | Admitting: Cardiovascular Disease

## 2022-11-11 DIAGNOSIS — E785 Hyperlipidemia, unspecified: Secondary | ICD-10-CM

## 2022-11-25 ENCOUNTER — Encounter: Payer: Self-pay | Admitting: Cardiovascular Disease

## 2022-11-25 NOTE — Progress Notes (Unsigned)
[name] Date of Birth  [birth year]       [facility]    [place] [address]  [address] [place], [place]  [address]   [place], [place]  [address] [phone]     [phone]   Fax  [phone]    Fax [phone]  Problem List: 1. Syncope  2. Dyspnea 3.  Sick sinus syndrome -  S/p pacer    Elmon is a 89 yo with a recent episode of collapse.  He was seen with his wife who provided most of the accurate history - he seems to play his symptoms down quite a bit.    He got out of his truck and collapsed in the parking lot.  He was talking and remained conscious the whole time.   He had severe nausea and vomitting after that.   He remained unsteady and was not able to walk for several hours.  He did not want to go to the doctor or ER.    He felt better after several days.  He were 30 day event monitor. He was found to have normal sinus rhythm. He had occasional episodes of frequent premature ventricular contractions-occasionally in a bigeminal pattern.  He did not have any symptoms while wearing the monitor.  He had an echocardiogram which revealed normal left ventricle systolic function with an ejection fraction of 50-55%. He had normal wall motion. He had trivial aortic insufficiency, mild mitral regurgitation. The left atrium was mildly to moderately dilated.  His EEG was normal.  He's had 2 different sleep studies. The first study was July 31 which revealed severe sleep apnea during REM sleep. He had a second sleep study the results of which are not known at this time.  His MRI was normal. The head MRA is normal.  September 27, 2013:  Mr. Doxtater has had an implantable loop recorder placed.   He's had a few episodes of presyncope.  He exercises on occasion. He denies any chest pain or shortness breath.  He was found to have a 3 second pause with his ILR.    Sept. 16,2015:  Jaquis has done well.  No further syncopal episodes.    Still has the implantable loop.    Has been busy working in his garden this summer.   Sept. 19 , 2016: Had an episode of dizziness 2 weeks ago .  No CP   Sept. 7, 2017:  Al is seen today for follow up visit No further passing out episodes.  No CP or dyspnea  Sept. 24, 2018  Al is seen , some DOE (according to wife) but he thinks hes doing fine .  No CP , or dizziness.  Gets pacer checked remotely   Sept. 24, 2019: Al is seen back today for follow-up of his sick sinus syndrome.  He had a pacemaker placed several years ago and feels well. Also has a history of obstructive sleep apnea.  No CP or dyspnea  Exercised until his foot started hurting . No CP or dyspnea.   August 23, 2019: Hessie Diener is seen back today for follow-up of his sick sinus syndrome.  He had a pacemaker placed in his followed up with Dr. Graciela Husbands and  the EP team.  He was recently seen by Dr. Delano Metz for bilateral pneumonia in January.  He lost his sense of tast and smell - has returned now.  Never had a fever .  Has  tested negative for COVID  X 2 .  Took him a long time to get over his pneumonia   He was seen by Lizabeth Leyden, NP in November, 2020 for chest pain. Lexiscan Myoview study at that time revealed no ischemia and normal ejection fraction.  Is not having any other issues.     December 21, 2019  Al is seen for follow up of his sick sinus syndrome. Has had a pacer placed.  No cp or dyspnea. Cath in April 2021 shows occlusion of the LCx just after a large marginal .   The distal LCx fills fairly well with collaterals   Jan. 4, 2022: Al is seen for follow up of his SSS and CAD.   Has LCx occlusion with good collateral filling no CP , dyspnea, syncope Recent lipids look good on Zetia   September 26, 2021:   AL is seen for follow up visit for his SSS and CAD  No cp or dyspnea.  Gets some exercise Has some vague "uncomfortable" feeling but this is not related to exertion   Oct. 13, 2023  Al is see  for follow up of his SSS and CAD  Is having some chest pain  Does not take NTG  - the pain was not severe enough to take NRG Occurs with rest and exertion .    Nov 26, 2022 Al is seen for follow up of his SSS and CAD  No CP or dyspnea.    Labs from the [facility] with Dr. Salvadore Farber from May 13, 2022 reveals:  Total cholesterol is 114 Triglyceride level is 85 HDL is 27 LDL is 70   Current Outpatient Medications on File Prior to Visit  Medication Sig Dispense Refill   alfuzosin (UROXATRAL) 10 MG 24 hr tablet Take 10 mg by mouth daily before breakfast.      Ascorbic Acid (VITAMIN C) 1000 MG tablet Take 1,000 mg by mouth daily.     aspirin EC 81 MG tablet Take 1 tablet (81 mg total) by mouth daily.     Cyanocobalamin (B-12) 5000 MCG CAPS Take 5,000 mcg by mouth daily.     ezetimibe (ZETIA) 10 MG tablet TAKE 1 TABLET DAILY 90 tablet 2   nitroGLYCERIN (NITROSTAT) 0.4 MG SL tablet Place 1 tablet (0.4 mg total) under the tongue every 5 (five) minutes as needed for chest pain. 25 tablet 6   omeprazole (PRILOSEC) 40 MG capsule Take 40 mg by mouth daily.     No current facility-administered medications on file prior to visit.    Allergies  Allergen Reactions   Statins     Past Medical History:  Diagnosis Date   Chest pain    Dizziness    DOE (dyspnea on exertion)    GERD (gastroesophageal reflux disease)    History of cardiovascular stress test    ETT-Myoview (06/07/13): No ischemia, EF 56%; normal study.   History of echocardiogram    Echocardiogram (01/2013): EF of 50-55%, normal wall motion, trivial AI, mild MR, mild to moderate LAE   OSA on CPAP    Presence of permanent cardiac pacemaker    SSS (sick sinus syndrome) (HCC)    Syncope, near     Past Surgical History:  Procedure Laterality Date   EP IMPLANTABLE DEVICE N/A 08/23/2015   MDT Advisa DR MRI compatible pacemaker implanted by Dr Johney Frame for sick sinus and syncope   EP IMPLANTABLE DEVICE N/A  08/23/2015   Procedure: Loop Recorder Removal;  Surgeon:  Hillis Range, MD;  [facility]: [facility] INVASIVE CV LAB;  Service: Cardiovascular;  Laterality: N/A;   LEFT HEART CATH AND CORONARY ANGIOGRAPHY N/A 10/25/2019   Procedure: LEFT HEART CATH AND CORONARY ANGIOGRAPHY;  Surgeon: Marykay Lex, MD;  Location: [facility] INVASIVE CV LAB;  Service: Cardiovascular;  Laterality: N/A;   LOOP RECORDER IMPLANT  September 2014   LOOP RECORDER IMPLANT N/A 04/05/2013   Procedure: LOOP RECORDER IMPLANT;  Surgeon: Gardiner Rhyme, MD;  Location: [facility] CATH LAB;  Service: Cardiovascular;  Laterality: N/A;    Social History   Tobacco Use  Smoking Status Former   Packs/day: 3.00   Years: 30.00   Additional pack years: 0.00   Total pack years: 90.00   Types: Cigarettes   Quit date: 02/01/1982   Years since quitting: 40.8  Smokeless Tobacco Never    Social History   Substance and Sexual Activity  Alcohol Use No   Alcohol/week: 0.0 standard drinks of alcohol    Family History  Problem Relation Age of Onset   Other Father        Died at age 59  of unknown cause   Lung cancer Brother    Dementia Brother    Suicidality Brother        died of suicide   Lymphoma Brother    CAD Neg Hx    Sleep apnea Neg Hx     Reviw of Systems:  Reviewed in the HPI.  All other systems are negative.   Physical Exam: Blood pressure 110/60, pulse 64, height 6' (1.829 m), weight 189 lb (85.7 kg), SpO2 96 %.       GEN:  Well nourished, well developed in no acute distress HEENT: Normal NECK: No JVD; No carotid bruits LYMPHATICS: No lymphadenopathy CARDIAC: RRR , no murmurs, rubs, gallops RESPIRATORY:  Clear to auscultation without rales, wheezing or rhonchi  ABDOMEN: Soft, non-tender, non-distended MUSCULOSKELETAL:  No edema; No deformity  SKIN: Warm and dry NEUROLOGIC:  Alert and oriented x 3   ECG:   Assessment / Plan:   1.  Sick sinus syndrome -  -  stable , followed by Dr. Lia Foyer in our EP division .  2.   CAD:   no angina Labs from Dr. Salvadore Farber in Nov. Look good   Will follow up in 1 year     Kristeen Miss, MD  11/26/2022 1:28 PM    [facility] [address] [place],  [address] [place], [place]  [address] Pager [phone] Phone: [phone]; Fax: [phone]

## 2022-11-26 ENCOUNTER — Encounter: Payer: Self-pay | Admitting: Cardiovascular Disease

## 2022-11-26 ENCOUNTER — Ambulatory Visit: Payer: Medicare Other | Attending: Cardiovascular Disease | Admitting: Cardiovascular Disease

## 2022-11-26 VITALS — BP 110/60 | HR 64 | Ht 72.0 in | Wt 189.0 lb

## 2022-11-26 DIAGNOSIS — I2510 Atherosclerotic heart disease of native coronary artery without angina pectoris: Secondary | ICD-10-CM | POA: Diagnosis not present

## 2022-11-26 DIAGNOSIS — I495 Sick sinus syndrome: Secondary | ICD-10-CM | POA: Diagnosis not present

## 2022-11-26 NOTE — Patient Instructions (Signed)
Medication Instructions:  Your physician recommends that you continue on your current medications as directed. Please refer to the Current Medication list given to you today.  *If you need a refill on your cardiac medications before your next appointment, please call your pharmacy*   Lab Work: NONE If you have labs (blood work) drawn today and your tests are completely normal, you will receive your results only by: MyChart Message (if you have MyChart) OR A paper copy in the mail If you have any lab test that is abnormal or we need to change your treatment, we will call you to review the results.   Testing/Procedures: NONE   Follow-Up: At [facility], you and your health needs are our priority.  As part of our continuing mission to provide you with exceptional heart care, we have created designated Provider Care Teams.  These Care Teams include your primary Cardiologist (physician) and Advanced Practice Providers (APPs -  Physician Assistants and Nurse Practitioners) who all work together to provide you with the care you need, when you need it.  We recommend signing up for the patient portal called "MyChart".  Sign up information is provided on this After Visit Summary.  MyChart is used to connect with patients for Virtual Visits (Telemedicine).  Patients are able to view lab/test results, encounter notes, upcoming appointments, etc.  Non-urgent messages can be sent to your provider as well.   To learn more about what you can do with MyChart, go to [email or web address].    Your next appointment:   1 year(s)  Provider:   Philip Nahser, MD      

## 2022-12-29 ENCOUNTER — Ambulatory Visit (INDEPENDENT_AMBULATORY_CARE_PROVIDER_SITE_OTHER): Payer: Medicare Other

## 2022-12-29 DIAGNOSIS — I495 Sick sinus syndrome: Secondary | ICD-10-CM | POA: Diagnosis not present

## 2022-12-30 LAB — CUP PACEART REMOTE DEVICE CHECK
Battery Remaining Longevity: 52 mo
Battery Voltage: 2.98 V
Brady Statistic AP VP Percent: 0.01 %
Brady Statistic AP VS Percent: 9.53 %
Brady Statistic AS VP Percent: 0.04 %
Brady Statistic AS VS Percent: 90.42 %
Brady Statistic RA Percent Paced: 9.53 %
Brady Statistic RV Percent Paced: 0.05 %
Date Time Interrogation Session: 20240618091752
Implantable Lead Connection Status: 753985
Implantable Lead Connection Status: 753985
Implantable Lead Implant Date: 20170210
Implantable Lead Implant Date: 20170210
Implantable Lead Location: 753859
Implantable Lead Location: 753860
Implantable Lead Model: 5076
Implantable Lead Model: 5076
Implantable Pulse Generator Implant Date: 20170210
Lead Channel Impedance Value: 323 Ohm
Lead Channel Impedance Value: 323 Ohm
Lead Channel Impedance Value: 399 Ohm
Lead Channel Impedance Value: 437 Ohm
Lead Channel Pacing Threshold Amplitude: 0.625 V
Lead Channel Pacing Threshold Amplitude: 1 V
Lead Channel Pacing Threshold Pulse Width: 0.4 ms
Lead Channel Pacing Threshold Pulse Width: 0.4 ms
Lead Channel Sensing Intrinsic Amplitude: 13.625 mV
Lead Channel Sensing Intrinsic Amplitude: 13.625 mV
Lead Channel Sensing Intrinsic Amplitude: 2.25 mV
Lead Channel Sensing Intrinsic Amplitude: 2.25 mV
Lead Channel Setting Pacing Amplitude: 2 V
Lead Channel Setting Pacing Amplitude: 2.5 V
Lead Channel Setting Pacing Pulse Width: 0.4 ms
Lead Channel Setting Sensing Sensitivity: 2.8 mV
Zone Setting Status: 755011
Zone Setting Status: 755011

## 2023-01-20 NOTE — Progress Notes (Signed)
Remote pacemaker transmission.   

## 2023-03-04 ENCOUNTER — Ambulatory Visit: Payer: Medicare Other | Admitting: Adult Health

## 2023-03-30 ENCOUNTER — Ambulatory Visit (INDEPENDENT_AMBULATORY_CARE_PROVIDER_SITE_OTHER): Payer: Medicare Other

## 2023-03-30 DIAGNOSIS — I495 Sick sinus syndrome: Secondary | ICD-10-CM | POA: Diagnosis not present

## 2023-03-30 LAB — CUP PACEART REMOTE DEVICE CHECK
Battery Remaining Longevity: 48 mo
Battery Voltage: 2.97 V
Brady Statistic AP VP Percent: 0.01 %
Brady Statistic AP VS Percent: 7.4 %
Brady Statistic AS VP Percent: 0.04 %
Brady Statistic AS VS Percent: 92.55 %
Brady Statistic RA Percent Paced: 7.4 %
Brady Statistic RV Percent Paced: 0.05 %
Date Time Interrogation Session: 20240917074417
Implantable Lead Connection Status: 753985
Implantable Lead Connection Status: 753985
Implantable Lead Implant Date: 20170210
Implantable Lead Implant Date: 20170210
Implantable Lead Location: 753859
Implantable Lead Location: 753860
Implantable Lead Model: 5076
Implantable Lead Model: 5076
Implantable Pulse Generator Implant Date: 20170210
Lead Channel Impedance Value: 304 Ohm
Lead Channel Impedance Value: 323 Ohm
Lead Channel Impedance Value: 380 Ohm
Lead Channel Impedance Value: 437 Ohm
Lead Channel Pacing Threshold Amplitude: 0.625 V
Lead Channel Pacing Threshold Amplitude: 0.875 V
Lead Channel Pacing Threshold Pulse Width: 0.4 ms
Lead Channel Pacing Threshold Pulse Width: 0.4 ms
Lead Channel Sensing Intrinsic Amplitude: 13.375 mV
Lead Channel Sensing Intrinsic Amplitude: 13.375 mV
Lead Channel Sensing Intrinsic Amplitude: 2.125 mV
Lead Channel Sensing Intrinsic Amplitude: 2.125 mV
Lead Channel Setting Pacing Amplitude: 2 V
Lead Channel Setting Pacing Amplitude: 2.5 V
Lead Channel Setting Pacing Pulse Width: 0.4 ms
Lead Channel Setting Sensing Sensitivity: 2.8 mV
Zone Setting Status: 755011
Zone Setting Status: 755011

## 2023-04-14 NOTE — Progress Notes (Signed)
Remote pacemaker transmission.   

## 2023-06-29 ENCOUNTER — Ambulatory Visit: Payer: Medicare Other

## 2023-06-29 DIAGNOSIS — I495 Sick sinus syndrome: Secondary | ICD-10-CM

## 2023-06-30 LAB — CUP PACEART REMOTE DEVICE CHECK
Battery Remaining Longevity: 48 mo
Battery Voltage: 2.97 V
Brady Statistic AP VP Percent: 0.01 %
Brady Statistic AP VS Percent: 1.93 %
Brady Statistic AS VP Percent: 0.04 %
Brady Statistic AS VS Percent: 98.02 %
Brady Statistic RA Percent Paced: 1.93 %
Brady Statistic RV Percent Paced: 0.05 %
Date Time Interrogation Session: 20241217201651
Implantable Lead Connection Status: 753985
Implantable Lead Connection Status: 753985
Implantable Lead Implant Date: 20170210
Implantable Lead Implant Date: 20170210
Implantable Lead Location: 753859
Implantable Lead Location: 753860
Implantable Lead Model: 5076
Implantable Lead Model: 5076
Implantable Pulse Generator Implant Date: 20170210
Lead Channel Impedance Value: 304 Ohm
Lead Channel Impedance Value: 323 Ohm
Lead Channel Impedance Value: 399 Ohm
Lead Channel Impedance Value: 437 Ohm
Lead Channel Pacing Threshold Amplitude: 0.625 V
Lead Channel Pacing Threshold Amplitude: 0.75 V
Lead Channel Pacing Threshold Pulse Width: 0.4 ms
Lead Channel Pacing Threshold Pulse Width: 0.4 ms
Lead Channel Sensing Intrinsic Amplitude: 14.75 mV
Lead Channel Sensing Intrinsic Amplitude: 14.75 mV
Lead Channel Sensing Intrinsic Amplitude: 2.875 mV
Lead Channel Sensing Intrinsic Amplitude: 2.875 mV
Lead Channel Setting Pacing Amplitude: 2 V
Lead Channel Setting Pacing Amplitude: 2.5 V
Lead Channel Setting Pacing Pulse Width: 0.4 ms
Lead Channel Setting Sensing Sensitivity: 2.8 mV
Zone Setting Status: 755011
Zone Setting Status: 755011

## 2023-08-05 NOTE — Addendum Note (Signed)
Addended by: Geralyn Flash D on: 08/05/2023 04:47 PM   Modules accepted: Orders

## 2023-08-05 NOTE — Progress Notes (Signed)
Remote pacemaker transmission.   

## 2023-08-09 ENCOUNTER — Other Ambulatory Visit: Payer: Self-pay | Admitting: Cardiovascular Disease

## 2023-08-09 DIAGNOSIS — E785 Hyperlipidemia, unspecified: Secondary | ICD-10-CM

## 2023-10-04 ENCOUNTER — Ambulatory Visit (INDEPENDENT_AMBULATORY_CARE_PROVIDER_SITE_OTHER)

## 2023-10-04 DIAGNOSIS — I495 Sick sinus syndrome: Secondary | ICD-10-CM | POA: Diagnosis not present

## 2023-10-05 LAB — CUP PACEART REMOTE DEVICE CHECK
Battery Remaining Longevity: 45 mo
Battery Voltage: 2.97 V
Brady Statistic AP VP Percent: 0.01 %
Brady Statistic AP VS Percent: 5.18 %
Brady Statistic AS VP Percent: 0.04 %
Brady Statistic AS VS Percent: 94.77 %
Brady Statistic RA Percent Paced: 5.18 %
Brady Statistic RV Percent Paced: 0.05 %
Date Time Interrogation Session: 20250324152742
Implantable Lead Connection Status: 753985
Implantable Lead Connection Status: 753985
Implantable Lead Implant Date: 20170210
Implantable Lead Implant Date: 20170210
Implantable Lead Location: 753859
Implantable Lead Location: 753860
Implantable Lead Model: 5076
Implantable Lead Model: 5076
Implantable Pulse Generator Implant Date: 20170210
Lead Channel Impedance Value: 304 Ohm
Lead Channel Impedance Value: 323 Ohm
Lead Channel Impedance Value: 399 Ohm
Lead Channel Impedance Value: 437 Ohm
Lead Channel Pacing Threshold Amplitude: 0.625 V
Lead Channel Pacing Threshold Amplitude: 0.875 V
Lead Channel Pacing Threshold Pulse Width: 0.4 ms
Lead Channel Pacing Threshold Pulse Width: 0.4 ms
Lead Channel Sensing Intrinsic Amplitude: 14.5 mV
Lead Channel Sensing Intrinsic Amplitude: 14.5 mV
Lead Channel Sensing Intrinsic Amplitude: 2.625 mV
Lead Channel Sensing Intrinsic Amplitude: 2.625 mV
Lead Channel Setting Pacing Amplitude: 2 V
Lead Channel Setting Pacing Amplitude: 2.5 V
Lead Channel Setting Pacing Pulse Width: 0.4 ms
Lead Channel Setting Sensing Sensitivity: 2.8 mV
Zone Setting Status: 755011
Zone Setting Status: 755011

## 2023-10-08 ENCOUNTER — Ambulatory Visit: Payer: Medicare Other | Attending: Cardiovascular Disease | Admitting: Cardiovascular Disease

## 2023-10-08 ENCOUNTER — Encounter: Payer: Self-pay | Admitting: Cardiovascular Disease

## 2023-10-08 VITALS — BP 120/74 | HR 62 | Ht 72.0 in | Wt 184.0 lb

## 2023-10-08 DIAGNOSIS — I4729 Other ventricular tachycardia: Secondary | ICD-10-CM | POA: Diagnosis present

## 2023-10-08 DIAGNOSIS — Z950 Presence of cardiac pacemaker: Secondary | ICD-10-CM | POA: Insufficient documentation

## 2023-10-08 DIAGNOSIS — I495 Sick sinus syndrome: Secondary | ICD-10-CM | POA: Insufficient documentation

## 2023-10-08 NOTE — Patient Instructions (Signed)
 Medication Instructions:  Your physician recommends that you continue on your current medications as directed. Please refer to the Current Medication list given to you today. *If you need a refill on your cardiac medications before your next appointment, please call your pharmacy*   Follow-Up: At [facility], you and your health needs are our priority.  As part of our continuing mission to provide you with exceptional heart care, our providers are all part of one team.  This team includes your primary Cardiologist (physician) and Advanced Practice Providers or APPs (Physician Assistants and Nurse Practitioners) who all work together to provide you with the care you need, when you need it.  Your next appointment:   1 year(s)  Provider:   York Pellant, MD   We recommend signing up for the patient portal called "MyChart".  Sign up information is provided on this After Visit Summary.  MyChart is used to connect with patients for Virtual Visits (Telemedicine).  Patients are able to view lab/test results, encounter notes, upcoming appointments, etc.  Non-urgent messages can be sent to your provider as well.   To learn more about what you can do with MyChart, go to [email or web address].        1st Floor: - Lobby - Registration  - Pharmacy  - Lab - Cafe  2nd Floor: - PV Lab - Diagnostic Testing (echo, CT, nuclear med)  3rd Floor: - Vacant  4th Floor: - TCTS (cardiothoracic surgery) - AFib Clinic - Structural Heart Clinic - Vascular Surgery  - Vascular Ultrasound  5th Floor: - HeartCare Cardiology (general and EP) - Clinical Pharmacy for coumadin, hypertension, lipid, weight-loss medications, and med management appointments    Valet parking services will be available as well.

## 2023-10-08 NOTE — Progress Notes (Signed)
  Electrophysiology Office Note:    Date:  10/08/2023   ID:  Colton Bennett, DOB 06-Oct-1933, MRN [id number]  PCP:  Vivien Presto, MD   [facility] Providers Cardiologist:  Kristeen Miss, MD Electrophysiologist:  Hillis Range, MD (Inactive)     Referring MD: Vivien Presto, MD   History of Present Illness:    Colton Bennett is a 89 y.o. male with a medical history significant for sick sinus syndrome with Medtronic pacemaker, coronary artery disease, obstructive sleep apnea on CPAP referred for ice management.     He has a Medtronic dual-chamber pacemaker implanted by Dr. Johney Frame in February 2017      Today, he reports that he is at baseline and has not complaints.  he has no device related complaints -- no new tenderness, drainage, redness.   EKGs/Labs/Other Studies Reviewed Today:     EKG:   EKG Interpretation Date/Time:  Friday October 08 2023 15:42:41 EDT Ventricular Rate:  62 PR Interval:  184 QRS Duration:  82 QT Interval:  430 QTC Calculation: 436 R Axis:   61  Text Interpretation: Normal sinus rhythm Normal ECG When compared with ECG of 24-Aug-2015 03:21, No significant change was found Confirmed by York Pellant [phone] on 10/08/2023 4:13:42 PM     Physical Exam:    VS:  BP 120/74 (BP Location: Left Arm, Patient Position: Sitting, Cuff Size: Large)   Pulse 62   Ht 6' (1.829 m)   Wt 184 lb (83.5 kg)   SpO2 95%   BMI 24.95 kg/m     Wt Readings from Last 3 Encounters:  10/08/23 184 lb (83.5 kg)  11/26/22 189 lb (85.7 kg)  10/07/22 195 lb (88.5 kg)     GEN: Well nourished, well developed in no acute distress CARDIAC: RRR, no murmurs, rubs, gallops he has no device related complaints -- no new tenderness, drainage, redness.  RESPIRATORY:  Normal work of breathing MUSCULOSKELETAL: no edema    ASSESSMENT & PLAN:     Sick sinus syndrome Medtronic dual-chamber pacemaker was interrogated today --normal function See Paceart for  details He is not device dependent   Signed, Maurice Small, MD  10/08/2023 4:17 PM    [facility]

## 2023-11-19 NOTE — Progress Notes (Signed)
 Remote pacemaker transmission.

## 2023-11-19 NOTE — Addendum Note (Signed)
 Addended by: Edra Govern D on: 11/19/2023 01:26 PM   Modules accepted: Orders

## 2023-11-21 ENCOUNTER — Encounter: Payer: Self-pay | Admitting: Cardiovascular Disease

## 2023-11-21 NOTE — Progress Notes (Unsigned)
 Colton Bennett Date of Birth  04/10/1934       [facility]    [place] [address]  [address] [place], [place]  [address]   [place], [place]  [address] [phone]     [phone]   Fax  [phone]    Fax [phone]  Problem List: 1. Syncope  2. Dyspnea 3.  Sick sinus syndrome -  S/p pacer    Colton Bennett is a 89 yo with a recent episode of collapse.  He was seen with his wife who provided most of the accurate history - he seems to play his symptoms down quite a bit.    He got out of his truck and collapsed in the parking lot.  He was talking and remained conscious the whole time.   He had severe nausea and vomitting after that.   He remained unsteady and was not able to walk for several hours.  He did not want to go to the doctor or ER.    He felt better after several days.  He were 30 day event monitor. He was found to have normal sinus rhythm. He had occasional episodes of frequent premature ventricular contractions-occasionally in a bigeminal pattern.  He did not have any symptoms while wearing the monitor.  He had an echocardiogram which revealed normal left ventricle systolic function with an ejection fraction of 50-55%. He had normal wall motion. He had trivial aortic insufficiency, mild mitral regurgitation. The left atrium was mildly to moderately dilated.  His EEG was normal.  He's had 2 different sleep studies. The first study was July 31 which revealed severe sleep apnea during REM sleep. He had a second sleep study the results of which are not known at this time.  His MRI was normal. The head MRA is normal.  September 27, 2013:  Colton Bennett has had an implantable loop recorder placed.   He's had a few episodes of presyncope.  He exercises on occasion. He denies any chest pain or shortness breath.  He was found to have a 3 second pause with his ILR.    Sept. 16,2015:  Colton Bennett has done well.  No further syncopal episodes.    Still has the implantable loop.    Has been busy working in his garden this summer.   Sept. 19 , 2016: Had an episode of dizziness 2 weeks ago .  No CP   Sept. 7, 2017:  Colton Bennett is seen today for follow up visit No further passing out episodes.  No CP or dyspnea  Sept. 24, 2018  Colton Bennett is seen , some DOE (according to wife) but he thinks hes doing fine .  No CP , or dizziness.  Gets pacer checked remotely   Sept. 24, 2019: Colton Bennett is seen back today for follow-up of his sick sinus syndrome.  He had a pacemaker placed several years ago and feels well. Also has a history of obstructive sleep apnea.  No CP or dyspnea  Exercised until his foot started hurting . No CP or dyspnea.   August 23, 2019: Colton Bennett is seen back today for follow-up of his sick sinus syndrome.  He had a pacemaker placed in his followed up with Dr. Rodolfo Clan and  the EP team.  He was recently seen by Dr. Beauford Bounds for bilateral pneumonia in January.  He lost his sense of tast and smell - has returned now.  Never had a fever .  Has  tested negative for COVID  X 2 .  Took him a long time to get over his pneumonia   He was seen by Elodia Hailstone, NP in November, 2020 for chest pain. Lexiscan  Myoview  study at that time revealed no ischemia and normal ejection fraction.  Is not having any other issues.     December 21, 2019  Colton Bennett is seen for follow up of his sick sinus syndrome. Has had a pacer placed.  No cp or dyspnea. Cath in April 2021 shows occlusion of the LCx just after a large marginal .   The distal LCx fills Bennett well with collaterals   Jan. 4, 2022: Colton Bennett is seen for follow up of his SSS and CAD.   Has LCx occlusion with good collateral filling no CP , dyspnea, syncope Recent lipids look good on Zetia    September 26, 2021:   Colton Bennett is seen for follow up visit for his SSS and CAD  No cp or dyspnea.  Gets some exercise Has some vague "uncomfortable" feeling but this is not related to exertion   Oct. 13, 2023  Colton Bennett is see  for follow up of his SSS and CAD  Is having some chest pain  Does not take NTG  - the pain was not severe enough to take NRG Occurs with rest and exertion .    Nov 26, 2022 Colton Bennett is seen for follow up of his SSS and CAD  No CP or dyspnea.    Labs from the [facility] with Dr. Reola Casino from May 13, 2022 reveals:  Total cholesterol is 114 Triglyceride level is 85 HDL is 27 LDL is 70  May 12 ,2025 Colton Bennett is seen for follow up of his CAD and SSS  Feels well, no cp, no syncope or dyspnea   Labs from Dr. Reola Casino ( [facility])  Cholesterol, Total 100 - 199 mg/dL 604   Triglycerides 0 - 149 mg/dL 77   HDL >54 mg/dL 30 Low    VLDL Cholesterol Cal 5 - 40 mg/dL 16   LDL 0 - 99 mg/dL 65   LDL/HDL Ratio 0.0 - 3.6 ratio 2.2        Current Outpatient Medications on File Prior to Visit  Medication Sig Dispense Refill   alfuzosin  (UROXATRAL ) 10 MG 24 hr tablet Take 10 mg by mouth daily before breakfast.      Ascorbic Acid (VITAMIN C) 1000 MG tablet Take 1,000 mg by mouth daily.     aspirin  EC 81 MG tablet Take 1 tablet (81 mg total) by mouth daily.     Cyanocobalamin (B-12) 5000 MCG CAPS Take 5,000 mcg by mouth daily.     ezetimibe  (ZETIA ) 10 MG tablet TAKE 1 TABLET DAILY 90 tablet 1   gabapentin (NEURONTIN) 300 MG capsule Take 300 mg by mouth 3 (three) times daily.     nitroGLYCERIN  (NITROSTAT ) 0.4 MG SL tablet Place 1 tablet (0.4 mg total) under the tongue every 5 (five) minutes as needed for chest pain. 25 tablet 6   omeprazole (PRILOSEC) 40 MG capsule Take 40 mg by mouth daily.     No current facility-administered medications on file prior to visit.    Allergies  Allergen Reactions   Statins     Past Medical History:  Diagnosis Date   Chest pain    Dizziness    DOE (dyspnea on exertion)    GERD (gastroesophageal reflux disease)    History of cardiovascular stress test    ETT-Myoview  (06/07/13): No  ischemia, EF 56%; normal study.   History of echocardiogram     Echocardiogram (01/2013): EF of 50-55%, normal wall motion, trivial AI, mild MR, mild to moderate LAE   OSA on CPAP    Presence of permanent cardiac pacemaker    SSS (sick sinus syndrome) (HCC)    Syncope, near     Past Surgical History:  Procedure Laterality Date   EP IMPLANTABLE DEVICE N/A 08/23/2015   MDT Advisa DR MRI compatible pacemaker implanted by Dr Nunzio Belch for sick sinus and syncope   EP IMPLANTABLE DEVICE N/A 08/23/2015   Procedure: Loop Recorder Removal;  Surgeon: Jolly Needle, MD;  Location: [facility] INVASIVE CV LAB;  Service: Cardiovascular;  Laterality: N/A;   LEFT HEART CATH AND CORONARY ANGIOGRAPHY N/A 10/25/2019   Procedure: LEFT HEART CATH AND CORONARY ANGIOGRAPHY;  Surgeon: Arleen Lacer, MD;  Location: [facility] INVASIVE CV LAB;  Service: Cardiovascular;  Laterality: N/A;   LOOP RECORDER IMPLANT  September 2014   LOOP RECORDER IMPLANT N/A 04/05/2013   Procedure: LOOP RECORDER IMPLANT;  Surgeon: Ellaree Gunther, MD;  Location: [facility] CATH LAB;  Service: Cardiovascular;  Laterality: N/A;    Social History   Tobacco Use  Smoking Status Former   Current packs/day: 0.00   Average packs/day: 3.0 packs/day for 30.0 years (90.0 ttl pk-yrs)   Types: Cigarettes   Start date: 02/02/1952   Quit date: 02/01/1982   Years since quitting: 41.8  Smokeless Tobacco Never    Social History   Substance and Sexual Activity  Alcohol Use No   Alcohol/week: 0.0 standard drinks of alcohol    Family History  Problem Relation Age of Onset   Other Father        Died at age 47  of unknown cause   Lung cancer Brother    Dementia Brother    Suicidality Brother        died of suicide   Lymphoma Brother    CAD Neg Hx    Sleep apnea Neg Hx     Reviw of Systems:  Reviewed in the HPI.  All other systems are negative.    Physical Exam: Blood pressure 122/65, pulse 80, height 6' (1.829 m), weight 185 lb (83.9 kg), SpO2 98%.       GEN:  Well nourished, well developed in no acute  distress HEENT: Normal NECK: No JVD; No carotid bruits LYMPHATICS: No lymphadenopathy CARDIAC: RRR , no murmurs, rubs, gallops RESPIRATORY:  Clear to auscultation without rales, wheezing or rhonchi  ABDOMEN: Soft, non-tender, non-distended MUSCULOSKELETAL:  No edema; No deformity  SKIN: Warm and dry NEUROLOGIC:  Alert and oriented x 3   ECG:   Assessment / Plan:   1.  Sick sinus syndrome -  -    s/p pacer   2.  CAD:   no CP      Ahmad Alert, MD  11/22/2023 11:17 AM    [facility] [address] [place],  [address] [place], [place]  [address] Pager [phone] Phone: [phone]; Fax: [phone]

## 2023-11-22 ENCOUNTER — Ambulatory Visit: Payer: Medicare Other | Attending: Cardiovascular Disease | Admitting: Cardiovascular Disease

## 2023-11-22 ENCOUNTER — Encounter: Payer: Self-pay | Admitting: Cardiovascular Disease

## 2023-11-22 VITALS — BP 122/65 | HR 80 | Ht 72.0 in | Wt 185.0 lb

## 2023-11-22 DIAGNOSIS — I10 Essential (primary) hypertension: Secondary | ICD-10-CM | POA: Diagnosis present

## 2023-11-22 DIAGNOSIS — E785 Hyperlipidemia, unspecified: Secondary | ICD-10-CM | POA: Insufficient documentation

## 2023-11-22 NOTE — Patient Instructions (Signed)
 Follow-Up: At [facility], you and your health needs are our priority.  As part of our continuing mission to provide you with exceptional heart care, our providers are all part of one team.  This team includes your primary Cardiologist (physician) and Advanced Practice Providers or APPs (Physician Assistants and Nurse Practitioners) who all work together to provide you with the care you need, when you need it.  Your next appointment:   1 year(s)  Provider:   Ahmad Alert, MD

## 2024-01-03 ENCOUNTER — Ambulatory Visit (INDEPENDENT_AMBULATORY_CARE_PROVIDER_SITE_OTHER)

## 2024-01-03 DIAGNOSIS — I495 Sick sinus syndrome: Secondary | ICD-10-CM | POA: Diagnosis not present

## 2024-01-04 LAB — CUP PACEART REMOTE DEVICE CHECK
Battery Remaining Longevity: 40 mo
Battery Voltage: 2.96 V
Brady Statistic AP VP Percent: 0.01 %
Brady Statistic AP VS Percent: 7.3 %
Brady Statistic AS VP Percent: 0.04 %
Brady Statistic AS VS Percent: 92.64 %
Brady Statistic RA Percent Paced: 7.3 %
Brady Statistic RV Percent Paced: 0.06 %
Date Time Interrogation Session: 20250623112631
Implantable Lead Connection Status: 753985
Implantable Lead Connection Status: 753985
Implantable Lead Implant Date: 20170210
Implantable Lead Implant Date: 20170210
Implantable Lead Location: 753859
Implantable Lead Location: 753860
Implantable Lead Model: 5076
Implantable Lead Model: 5076
Implantable Pulse Generator Implant Date: 20170210
Lead Channel Impedance Value: 285 Ohm
Lead Channel Impedance Value: 304 Ohm
Lead Channel Impedance Value: 399 Ohm
Lead Channel Impedance Value: 418 Ohm
Lead Channel Pacing Threshold Amplitude: 0.625 V
Lead Channel Pacing Threshold Amplitude: 0.875 V
Lead Channel Pacing Threshold Pulse Width: 0.4 ms
Lead Channel Pacing Threshold Pulse Width: 0.4 ms
Lead Channel Sensing Intrinsic Amplitude: 14.5 mV
Lead Channel Sensing Intrinsic Amplitude: 14.5 mV
Lead Channel Sensing Intrinsic Amplitude: 2.125 mV
Lead Channel Sensing Intrinsic Amplitude: 2.125 mV
Lead Channel Setting Pacing Amplitude: 2 V
Lead Channel Setting Pacing Amplitude: 2.5 V
Lead Channel Setting Pacing Pulse Width: 0.4 ms
Lead Channel Setting Sensing Sensitivity: 2.8 mV
Zone Setting Status: 755011
Zone Setting Status: 755011

## 2024-01-06 ENCOUNTER — Ambulatory Visit: Payer: Self-pay | Admitting: Cardiovascular Disease

## 2024-02-04 ENCOUNTER — Other Ambulatory Visit: Payer: Self-pay

## 2024-02-04 DIAGNOSIS — E785 Hyperlipidemia, unspecified: Secondary | ICD-10-CM

## 2024-02-04 MED ORDER — EZETIMIBE 10 MG PO TABS: 10.0000 mg | ORAL_TABLET | Freq: Every day | ORAL | 2 refills | Status: AC

## 2024-02-07 NOTE — Addendum Note (Signed)
 Addended by: TAWNI DRILLING D on: 02/07/2024 11:30 AM   Modules accepted: Orders

## 2024-02-07 NOTE — Progress Notes (Signed)
 Remote pacemaker transmission.

## 2024-04-03 ENCOUNTER — Ambulatory Visit (INDEPENDENT_AMBULATORY_CARE_PROVIDER_SITE_OTHER)

## 2024-04-03 DIAGNOSIS — I495 Sick sinus syndrome: Secondary | ICD-10-CM | POA: Diagnosis not present

## 2024-04-06 LAB — CUP PACEART REMOTE DEVICE CHECK
Battery Remaining Longevity: 36 mo
Battery Voltage: 2.95 V
Brady Statistic AP VP Percent: 0.03 %
Brady Statistic AP VS Percent: 10.62 %
Brady Statistic AS VP Percent: 0.04 %
Brady Statistic AS VS Percent: 89.31 %
Brady Statistic RA Percent Paced: 10.64 %
Brady Statistic RV Percent Paced: 0.07 %
Date Time Interrogation Session: 20250924113300
Implantable Lead Connection Status: 753985
Implantable Lead Connection Status: 753985
Implantable Lead Implant Date: 20170210
Implantable Lead Implant Date: 20170210
Implantable Lead Location: 753859
Implantable Lead Location: 753860
Implantable Lead Model: 5076
Implantable Lead Model: 5076
Implantable Pulse Generator Implant Date: 20170210
Lead Channel Impedance Value: 304 Ohm
Lead Channel Impedance Value: 323 Ohm
Lead Channel Impedance Value: 399 Ohm
Lead Channel Impedance Value: 418 Ohm
Lead Channel Pacing Threshold Amplitude: 0.625 V
Lead Channel Pacing Threshold Amplitude: 0.75 V
Lead Channel Pacing Threshold Pulse Width: 0.4 ms
Lead Channel Pacing Threshold Pulse Width: 0.4 ms
Lead Channel Sensing Intrinsic Amplitude: 14 mV
Lead Channel Sensing Intrinsic Amplitude: 14 mV
Lead Channel Sensing Intrinsic Amplitude: 2.5 mV
Lead Channel Sensing Intrinsic Amplitude: 2.5 mV
Lead Channel Setting Pacing Amplitude: 2 V
Lead Channel Setting Pacing Amplitude: 2.5 V
Lead Channel Setting Pacing Pulse Width: 0.4 ms
Lead Channel Setting Sensing Sensitivity: 2.8 mV
Zone Setting Status: 755011
Zone Setting Status: 755011

## 2024-04-06 NOTE — Progress Notes (Signed)
 Remote PPM Transmission

## 2024-04-18 ENCOUNTER — Ambulatory Visit: Payer: Self-pay | Admitting: Cardiovascular Disease

## 2024-05-04 NOTE — Progress Notes (Unsigned)
 Cardiology Office Note:  .   Date:  05/05/2024  ID:  Colton Bennett, DOB 29-Feb-1934, MRN [id number] PCP: Bobbette Coye LABOR, MD  [facility] Providers Cardiologist:  Aleene Passe, MD (Inactive) Electrophysiologist:  Lynwood Rakers, MD (Inactive) { History of Present Illness: .    Chief Complaint  Patient presents with   Follow-up         Colton Bennett is a 89 y.o. male with history of CAD who presents for follow-up.    History of Present Illness   Colton Bennett is an 89 year old male with coronary artery disease and a history of pacemaker implantation who presents with left arm pain.  He has been experiencing constant left arm pain for the past couple of months, which becomes more noticeable with arm movements, such as reaching for a drink. The pain is described as a general ache rather than sharp pain, with no radiation to the shoulder, and is not associated with chest discomfort or dyspnea. Occurs upper left arm. He has not sought prior medical evaluation for this issue and has not tried any specific treatments like Tylenol  or icing the area.  His past medical history includes coronary artery disease with an occluded distal circumflex in the AV groove, hyperlipidemia, and sick sinus syndrome, for which he has a pacemaker. He underwent a cardiac catheterization in 2021, revealing a small blockage that did not require stenting. He denies any history of myocardial infarction or cerebrovascular accidents, although he did experience a collapse in 2014. He is currently taking ezetimibe  for cholesterol management.  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 He does not smoke or consume alcohol. He is married with one daughter, two grandchildren, and five great-grandchildren.  No chest pain, shortness of breath, or recent falls or fractures. Reports left arm pain without shoulder involvement.          Problem  List CAD -100% occluded AV groove LCX: 2021 2. HLD -T chol 111, HDL 30, LDL 65, TG 77 3. SSS s/p ppm    ROS: All other ROS reviewed and negative. Pertinent positives noted in the HPI.     Studies Reviewed: [facility]       LHC 10/25/2019 LPAV lesion is 100% stenosed. The left ventricular systolic function is normal - EJECTION FRACTION is 55-65% by visual estimate. Cannot exclude inferolateral hypokinesis. LV end diastolic pressure is mildly elevated.   SUMMARY Severe Single-Vessel CAD with flush occlusion of the AV groove circumflex, LPL and PDA filled via septal collaterals. Small nondominant RCA with faint collaterals to small AV groove circumflex. Apparently normal LVEF and EDP. Physical Exam:   VS:  BP 136/68   Pulse 63   Ht 6' (1.829 m)   Wt 178 lb 9.6 oz (81 kg)   SpO2 97%   BMI 24.22 kg/m    Wt Readings from Last 3 Encounters:  05/05/24 178 lb 9.6 oz (81 kg)  11/22/23 185 lb (83.9 kg)  10/08/23 184 lb (83.5 kg)    GEN: Well nourished, well developed in no acute distress NECK: No JVD; No carotid bruits CARDIAC: RRR, no murmurs, rubs, gallops RESPIRATORY:  Clear to auscultation without rales, wheezing or rhonchi  ABDOMEN: Soft, non-tender, non-distended EXTREMITIES:  point tenderness of the left humerus  ASSESSMENT AND PLAN: .   Assessment and Plan    Coronary artery disease without angina Coronary artery disease with occluded distal  circumflex in AV groove. No angina symptoms. No additional cardiac testing needed. - Continue aspirin  therapy.  Sick sinus syndrome, status post pacemaker Status post pacemaker implantation.  Left arm pain, likely musculoskeletal Left arm pain likely musculoskeletal, possibly related to muscle or bone. No cardiac origin indicated. - Recommend Tylenol  650 mg three times a day for five days. - Order x-ray of left shoulder and left arm. - Advise use of heat and ice to affected area. - Consider referral to physical therapy if symptoms  persist. - Instruct to call if symptoms worsen or do not improve.  Hyperlipidemia Hyperlipidemia managed with ezetimibe . LDL cholesterol at goal, 75 mg/dL. - Continue ezetimibe  10 mg daily.              Follow-up: Return in about 6 months (around 11/03/2024).   Signed, Darryle DASEN. Barbaraann, MD, [facility]  [facility]  [address] [place], [place] [address] [phone]  4:02 PM

## 2024-05-05 ENCOUNTER — Ambulatory Visit: Attending: Cardiovascular Disease | Admitting: Cardiovascular Disease

## 2024-05-05 ENCOUNTER — Encounter: Payer: Self-pay | Admitting: Cardiovascular Disease

## 2024-05-05 ENCOUNTER — Ambulatory Visit
Admission: RE | Admit: 2024-05-05 | Discharge: 2024-05-05 | Disposition: A | Discharge status: Home / Self Care | Source: Ambulatory Visit | Attending: Cardiovascular Disease | Admitting: Cardiovascular Disease

## 2024-05-05 VITALS — BP 136/68 | HR 63 | Ht 72.0 in | Wt 178.6 lb

## 2024-05-05 DIAGNOSIS — I495 Sick sinus syndrome: Secondary | ICD-10-CM | POA: Diagnosis present

## 2024-05-05 DIAGNOSIS — E785 Hyperlipidemia, unspecified: Secondary | ICD-10-CM | POA: Diagnosis present

## 2024-05-05 DIAGNOSIS — I2510 Atherosclerotic heart disease of native coronary artery without angina pectoris: Secondary | ICD-10-CM | POA: Diagnosis present

## 2024-05-05 DIAGNOSIS — M79602 Pain in left arm: Secondary | ICD-10-CM

## 2024-05-05 DIAGNOSIS — I10 Essential (primary) hypertension: Secondary | ICD-10-CM

## 2024-05-05 NOTE — Patient Instructions (Signed)
 [address]

## 2024-05-07 ENCOUNTER — Ambulatory Visit: Payer: Self-pay | Admitting: Cardiovascular Disease

## 2024-07-03 ENCOUNTER — Ambulatory Visit

## 2024-07-03 DIAGNOSIS — I495 Sick sinus syndrome: Secondary | ICD-10-CM

## 2024-07-04 LAB — CUP PACEART REMOTE DEVICE CHECK
Battery Remaining Longevity: 38 mo
Battery Voltage: 2.95 V
Brady Statistic AP VP Percent: 0.01 %
Brady Statistic AP VS Percent: 3.22 %
Brady Statistic AS VP Percent: 0.04 %
Brady Statistic AS VS Percent: 96.73 %
Brady Statistic RA Percent Paced: 3.23 %
Brady Statistic RV Percent Paced: 0.05 %
Date Time Interrogation Session: 20251223115940
Implantable Lead Connection Status: 753985
Implantable Lead Connection Status: 753985
Implantable Lead Implant Date: 20170210
Implantable Lead Implant Date: 20170210
Implantable Lead Location: 753859
Implantable Lead Location: 753860
Implantable Lead Model: 5076
Implantable Lead Model: 5076
Implantable Pulse Generator Implant Date: 20170210
Lead Channel Impedance Value: 304 Ohm
Lead Channel Impedance Value: 304 Ohm
Lead Channel Impedance Value: 399 Ohm
Lead Channel Impedance Value: 418 Ohm
Lead Channel Pacing Threshold Amplitude: 0.75 V
Lead Channel Pacing Threshold Amplitude: 1 V
Lead Channel Pacing Threshold Pulse Width: 0.4 ms
Lead Channel Pacing Threshold Pulse Width: 0.4 ms
Lead Channel Sensing Intrinsic Amplitude: 14 mV
Lead Channel Sensing Intrinsic Amplitude: 14 mV
Lead Channel Sensing Intrinsic Amplitude: 2.625 mV
Lead Channel Sensing Intrinsic Amplitude: 2.625 mV
Lead Channel Setting Pacing Amplitude: 2 V
Lead Channel Setting Pacing Amplitude: 2.5 V
Lead Channel Setting Pacing Pulse Width: 0.4 ms
Lead Channel Setting Sensing Sensitivity: 2.8 mV
Zone Setting Status: 755011
Zone Setting Status: 755011

## 2024-07-05 NOTE — Progress Notes (Signed)
 Remote PPM Transmission

## 2024-07-07 ENCOUNTER — Telehealth: Payer: Self-pay | Admitting: *Deleted

## 2024-07-07 NOTE — Telephone Encounter (Signed)
 Patient called to confirm that his PPM remote transmission has been received as he received a message about a missed transmission. Confirmed receipt on 07/04/24 and 07/07/24. Pt denies any additional questions or concerns at this time.

## 2024-07-12 ENCOUNTER — Ambulatory Visit: Payer: Self-pay | Admitting: Cardiovascular Disease

## 2024-10-02 ENCOUNTER — Encounter

## 2025-01-01 ENCOUNTER — Encounter

## 2025-04-02 ENCOUNTER — Encounter
